# Patient Record
Sex: Female | Born: 1950 | Race: White | Hispanic: No | Smoking: Former smoker
Health system: Southern US, Community
[De-identification: ages and names within clinical notes are randomized; demographics above are authoritative.]

## PROBLEM LIST (undated history)

## (undated) DIAGNOSIS — C801 Malignant (primary) neoplasm, unspecified: Secondary | ICD-10-CM

## (undated) DIAGNOSIS — R748 Abnormal levels of other serum enzymes: Secondary | ICD-10-CM

## (undated) DIAGNOSIS — M1990 Unspecified osteoarthritis, unspecified site: Secondary | ICD-10-CM

## (undated) DIAGNOSIS — E039 Hypothyroidism, unspecified: Secondary | ICD-10-CM

## (undated) DIAGNOSIS — N3090 Cystitis, unspecified without hematuria: Secondary | ICD-10-CM

## (undated) DIAGNOSIS — F419 Anxiety disorder, unspecified: Secondary | ICD-10-CM

## (undated) DIAGNOSIS — Z87891 Personal history of nicotine dependence: Secondary | ICD-10-CM

## (undated) DIAGNOSIS — K219 Gastro-esophageal reflux disease without esophagitis: Secondary | ICD-10-CM

## (undated) DIAGNOSIS — Z8614 Personal history of Methicillin resistant Staphylococcus aureus infection: Secondary | ICD-10-CM

## (undated) DIAGNOSIS — C50912 Malignant neoplasm of unspecified site of left female breast: Secondary | ICD-10-CM

## (undated) DIAGNOSIS — F32A Depression, unspecified: Secondary | ICD-10-CM

## (undated) DIAGNOSIS — E669 Obesity, unspecified: Secondary | ICD-10-CM

## (undated) DIAGNOSIS — D649 Anemia, unspecified: Secondary | ICD-10-CM

## (undated) DIAGNOSIS — R7982 Elevated C-reactive protein (CRP): Secondary | ICD-10-CM

## (undated) DIAGNOSIS — R7303 Prediabetes: Secondary | ICD-10-CM

## (undated) DIAGNOSIS — Z8619 Personal history of other infectious and parasitic diseases: Secondary | ICD-10-CM

## (undated) DIAGNOSIS — E7800 Pure hypercholesterolemia, unspecified: Secondary | ICD-10-CM

## (undated) DIAGNOSIS — Z8542 Personal history of malignant neoplasm of other parts of uterus: Secondary | ICD-10-CM

## (undated) HISTORY — DX: Malignant (primary) neoplasm, unspecified: C80.1

## (undated) HISTORY — DX: Pure hypercholesterolemia, unspecified: E78.00

## (undated) HISTORY — PX: US LT BREAST BX W LOC DEV 1ST LESION IMG BX SPEC US GUIDE: IMG5431

## (undated) HISTORY — DX: Hypothyroidism, unspecified: E03.9

## (undated) HISTORY — DX: Personal history of other infectious and parasitic diseases: Z86.19

## (undated) HISTORY — PX: BILATERAL SALPINGOOPHORECTOMY: SHX1223

---
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
## 1976-10-23 HISTORY — PX: TUBAL LIGATION: SHX77

## 1982-10-23 HISTORY — PX: MANDIBLE FRACTURE SURGERY: SHX706

## 1994-10-23 DIAGNOSIS — Z87891 Personal history of nicotine dependence: Secondary | ICD-10-CM | POA: Insufficient documentation

## 2008-04-10 ENCOUNTER — Ambulatory Visit: Payer: Self-pay | Admitting: Family Medicine

## 2008-06-01 DIAGNOSIS — Z8614 Personal history of Methicillin resistant Staphylococcus aureus infection: Secondary | ICD-10-CM | POA: Insufficient documentation

## 2009-02-02 DIAGNOSIS — R7989 Other specified abnormal findings of blood chemistry: Secondary | ICD-10-CM | POA: Insufficient documentation

## 2009-02-02 DIAGNOSIS — E7800 Pure hypercholesterolemia, unspecified: Secondary | ICD-10-CM | POA: Insufficient documentation

## 2009-07-22 ENCOUNTER — Ambulatory Visit: Payer: Self-pay

## 2010-08-24 LAB — HM MAMMOGRAPHY

## 2010-11-15 ENCOUNTER — Ambulatory Visit: Payer: Self-pay | Admitting: Family Medicine

## 2010-11-26 ENCOUNTER — Ambulatory Visit: Payer: Self-pay | Admitting: Family Medicine

## 2011-10-24 DIAGNOSIS — E039 Hypothyroidism, unspecified: Secondary | ICD-10-CM

## 2011-10-24 HISTORY — DX: Hypothyroidism, unspecified: E03.9

## 2012-02-12 ENCOUNTER — Ambulatory Visit: Payer: Self-pay | Admitting: Obstetrics and Gynecology

## 2012-02-12 LAB — COMPREHENSIVE METABOLIC PANEL
Alkaline Phosphatase: 106 U/L (ref 50–136)
BUN: 26 mg/dL — ABNORMAL HIGH (ref 7–18)
Chloride: 105 mmol/L (ref 98–107)
Co2: 26 mmol/L (ref 21–32)
EGFR (Non-African Amer.): 56 — ABNORMAL LOW
Glucose: 101 mg/dL — ABNORMAL HIGH (ref 65–99)
Osmolality: 286 (ref 275–301)
Potassium: 4.2 mmol/L (ref 3.5–5.1)
Total Protein: 8.1 g/dL (ref 6.4–8.2)

## 2012-02-12 LAB — CBC
HCT: 44 % (ref 35.0–47.0)
MCHC: 32.1 g/dL (ref 32.0–36.0)
MCV: 89 fL (ref 80–100)
RBC: 4.97 10*6/uL (ref 3.80–5.20)
WBC: 7.7 10*3/uL (ref 3.6–11.0)

## 2012-02-23 ENCOUNTER — Ambulatory Visit: Payer: Self-pay | Admitting: Obstetrics and Gynecology

## 2012-02-27 LAB — PATHOLOGY REPORT

## 2012-03-05 DIAGNOSIS — C549 Malignant neoplasm of corpus uteri, unspecified: Secondary | ICD-10-CM | POA: Insufficient documentation

## 2012-03-23 DIAGNOSIS — C801 Malignant (primary) neoplasm, unspecified: Secondary | ICD-10-CM

## 2012-03-23 HISTORY — PX: LYMPHADENECTOMY: SHX15

## 2012-03-23 HISTORY — DX: Malignant (primary) neoplasm, unspecified: C80.1

## 2012-03-23 HISTORY — PX: BILATERAL SALPINGOOPHORECTOMY: SHX1223

## 2012-04-18 HISTORY — PX: ABDOMINAL HYSTERECTOMY: SHX81

## 2013-09-01 LAB — LIPID PANEL
Cholesterol: 225 mg/dL — AB (ref 0–200)
HDL: 46 mg/dL (ref 35–70)
LDL Cholesterol: 129 mg/dL
TRIGLYCERIDES: 252 mg/dL — AB (ref 40–160)

## 2013-09-01 LAB — TSH: TSH: 2.25 u[IU]/mL (ref <=5.90)

## 2013-09-01 LAB — HEMOGLOBIN A1C: Hgb A1c MFr Bld: 6.3 % — AB (ref 4.0–6.0)

## 2014-04-23 ENCOUNTER — Ambulatory Visit: Payer: Self-pay | Admitting: Family Medicine

## 2014-04-23 LAB — COMPREHENSIVE METABOLIC PANEL
ALT: 45 U/L (ref 12–78)
ANION GAP: 7 (ref 7–16)
AST: 34 U/L (ref 15–37)
Albumin: 4 g/dL (ref 3.4–5.0)
Alkaline Phosphatase: 98 U/L
BILIRUBIN TOTAL: 0.4 mg/dL (ref 0.2–1.0)
BUN: 20 mg/dL — AB (ref 7–18)
CALCIUM: 9.4 mg/dL (ref 8.5–10.1)
CHLORIDE: 108 mmol/L — AB (ref 98–107)
Co2: 26 mmol/L (ref 21–32)
Creatinine: 0.98 mg/dL (ref 0.60–1.30)
EGFR (Non-African Amer.): >60
Glucose: 127 mg/dL — ABNORMAL HIGH (ref 65–99)
Osmolality: 285 (ref 275–301)
Potassium: 4.2 mmol/L (ref 3.5–5.1)
Sodium: 141 mmol/L (ref 136–145)
Total Protein: 8 g/dL (ref 6.4–8.2)

## 2014-04-23 LAB — CBC WITH DIFFERENTIAL/PLATELET
BASOS PCT: 0.3 %
Basophil #: 0 10*3/uL (ref 0.0–0.1)
Eosinophil #: 0.1 10*3/uL (ref 0.0–0.7)
Eosinophil %: 1.2 %
HCT: 42.5 % (ref 35.0–47.0)
HGB: 14.1 g/dL (ref 12.0–16.0)
Lymphocyte #: 1 10*3/uL (ref 1.0–3.6)
Lymphocyte %: 10.9 %
MCH: 29.9 pg (ref 26.0–34.0)
MCHC: 33.3 g/dL (ref 32.0–36.0)
MCV: 90 fL (ref 80–100)
MONOS PCT: 4.3 %
Monocyte #: 0.4 x10 3/mm (ref 0.2–0.9)
NEUTROS ABS: 7.5 10*3/uL — AB (ref 1.4–6.5)
Neutrophil %: 83.3 %
Platelet: 269 10*3/uL (ref 150–440)
RBC: 4.74 10*6/uL (ref 3.80–5.20)
RDW: 13.9 % (ref 11.5–14.5)
WBC: 9.1 10*3/uL (ref 3.6–11.0)

## 2014-04-23 LAB — TSH: Thyroid Stimulating Horm: 2.02 u[IU]/mL

## 2014-10-01 LAB — BASIC METABOLIC PANEL
CREATININE: 1.1 mg/dL (ref 0.5–1.1)
GLUCOSE: 116 mg/dL
Potassium: 4.5 mmol/L (ref 3.4–5.3)
SODIUM: 142 mmol/L (ref 137–147)

## 2014-10-01 LAB — CBC AND DIFFERENTIAL
HCT: 281 % — AB (ref 36–46)
Hemoglobin: 14.2 g/dL (ref 12.0–16.0)
Platelets: 281 10*3/uL (ref 150–399)

## 2014-10-01 LAB — HEPATIC FUNCTION PANEL
ALT: 24 U/L (ref 7–35)
AST: 17 U/L (ref 13–35)

## 2014-12-02 ENCOUNTER — Ambulatory Visit: Payer: Self-pay | Admitting: Unknown Physician Specialty

## 2015-01-27 ENCOUNTER — Ambulatory Visit: Payer: No Typology Code available for payment source | Admitting: Diagnostic Neuroimaging

## 2015-02-14 NOTE — Op Note (Signed)
PATIENT NAME:  Melinda Mack, Melinda Mack MR#:  [id number] DATE OF BIRTH:  1951/05/30  DATE OF PROCEDURE:  02/23/2012  PREOPERATIVE DIAGNOSIS: Postmenopausal bleeding.  POSTOPERATIVE DIAGNOSIS: Postmenopausal bleeding.  PROCEDURES:  1. Dilation and curettage. 2. Hysteroscopy.   SURGEON: Prentice Docker, M.D.   ANESTHESIA: General.   ESTIMATED BLOOD LOSS: Minimal.   OPERATIVE FLUIDS: 700 mL crystalloid.   COMPLICATIONS: None.   FINDINGS: Polypoid lesions near right cornua.   SPECIMENS: Endometrial curettings.   CONDITION: Stable and unchanged at the end of the procedure.   INDICATION FOR PROCEDURE: [name] is a 64 year old postmenopausal female who presented to [facility] with complaint of postmenopausal bleeding. She underwent a work-up for this including an ultrasound which had findings suggestive for polyps. She was referred to me for management and after discussing multiple options to diagnose she elected to go to the operating room for the above-noted procedure.   PROCEDURE IN DETAIL: The patient was met in the preoperative area and her questions were answered and the procedure was reviewed. She was taken to the operating room where she was placed under general anesthesia, which was found to be adequate. She was then placed in the dorsal supine high lithotomy position and prepped and draped in the usual sterile fashion. At the beginning of the case, after a time out was called, her bladder was drained with a straight catheter. A sterile speculum was placed in the vagina and the anterior lip of the cervix was grasped with a single-tooth tenaculum. The uterus was sounded to a depth of approximately 6 cm. Her cervix was then dilated in a serial fashion using Hegar dilators to 8 mm. The hysteroscope was then gently introduced through the cervix and into the uterine cavity with the above-noted findings. The hysteroscope was removed and the endometrium was gently curetted for minimal returned  material from the endometrium. Final look with the hysteroscope revealed a nicely distending uterine cavity with removal of the aforementioned polypoid lesions. At this point, the procedure was terminated.   The patient tolerated the procedure well. Sponge, lap, and needle counts were correct x2. For VTE prophylaxis she had pneumatic compression stockings in place and operating throughout the entire procedure. She was awakened in the operating room and taken to the recovery room in stable condition.  ____________________________ Will Bonnet, MD sdj:slb D: 02/23/2012 15:37:08 ET T: 02/23/2012 15:45:15 ET JOB#: [id number]  cc: Will Bonnet, MD, <Dictator> Will Bonnet MD ELECTRONICALLY SIGNED 03/03/2012 21:57

## 2015-04-14 ENCOUNTER — Other Ambulatory Visit: Payer: Self-pay | Admitting: Family Medicine

## 2015-05-16 ENCOUNTER — Other Ambulatory Visit: Payer: Self-pay | Admitting: Family Medicine

## 2015-05-18 ENCOUNTER — Other Ambulatory Visit: Payer: Self-pay | Admitting: Family Medicine

## 2015-05-26 ENCOUNTER — Ambulatory Visit: Payer: No Typology Code available for payment source | Admitting: Diagnostic Neuroimaging

## 2015-06-15 DIAGNOSIS — R2689 Other abnormalities of gait and mobility: Secondary | ICD-10-CM | POA: Insufficient documentation

## 2015-06-15 DIAGNOSIS — E039 Hypothyroidism, unspecified: Secondary | ICD-10-CM | POA: Insufficient documentation

## 2015-06-15 DIAGNOSIS — E669 Obesity, unspecified: Secondary | ICD-10-CM | POA: Insufficient documentation

## 2015-06-15 DIAGNOSIS — R7303 Prediabetes: Secondary | ICD-10-CM | POA: Insufficient documentation

## 2015-06-15 DIAGNOSIS — N3090 Cystitis, unspecified without hematuria: Secondary | ICD-10-CM | POA: Insufficient documentation

## 2015-06-15 DIAGNOSIS — Z8542 Personal history of malignant neoplasm of other parts of uterus: Secondary | ICD-10-CM | POA: Insufficient documentation

## 2015-06-15 DIAGNOSIS — G4700 Insomnia, unspecified: Secondary | ICD-10-CM | POA: Insufficient documentation

## 2015-06-15 DIAGNOSIS — N951 Menopausal and female climacteric states: Secondary | ICD-10-CM | POA: Insufficient documentation

## 2015-06-15 DIAGNOSIS — R12 Heartburn: Secondary | ICD-10-CM | POA: Insufficient documentation

## 2015-06-15 DIAGNOSIS — L821 Other seborrheic keratosis: Secondary | ICD-10-CM | POA: Insufficient documentation

## 2015-06-16 ENCOUNTER — Ambulatory Visit (INDEPENDENT_AMBULATORY_CARE_PROVIDER_SITE_OTHER): Payer: No Typology Code available for payment source | Admitting: Family Medicine

## 2015-06-16 ENCOUNTER — Encounter: Payer: Self-pay | Admitting: Family Medicine

## 2015-06-16 VITALS — BP 108/68 | HR 67 | Temp 98.3°F | Resp 16 | Ht 65.25 in | Wt 184.0 lb

## 2015-06-16 DIAGNOSIS — Z23 Encounter for immunization: Secondary | ICD-10-CM | POA: Diagnosis not present

## 2015-06-16 DIAGNOSIS — R7303 Prediabetes: Secondary | ICD-10-CM

## 2015-06-16 DIAGNOSIS — E7800 Pure hypercholesterolemia, unspecified: Secondary | ICD-10-CM

## 2015-06-16 DIAGNOSIS — E039 Hypothyroidism, unspecified: Secondary | ICD-10-CM | POA: Diagnosis not present

## 2015-06-16 DIAGNOSIS — R5383 Other fatigue: Secondary | ICD-10-CM | POA: Diagnosis not present

## 2015-06-16 DIAGNOSIS — R7309 Other abnormal glucose: Secondary | ICD-10-CM

## 2015-06-16 NOTE — Progress Notes (Signed)
Patient: Melinda Mack Female    DOB: Nov 26, 1950   64 y.o.   MRN: [id number] Visit Date: 06/16/2015  Today's Provider: Lelon Huh, MD   Chief Complaint  Patient presents with  . Hypothyroidism    follow- up  . Hyperglycemia    follow- up  . Hyperlipidemia    follow- up   Subjective:    HPI  Prediabetes, Follow-up:   Lab Results  Component Value Date   HGBA1C 6.3* 09/01/2013   GLUCOSE 127* 04/23/2014   GLUCOSE 101* 02/12/2012    Last seen for for this2 years ago.  Management changes included  none.  Weight trend: stable Prior visit with dietician: no Current diet: well balanced Current exercise: none  Pertinent Labs:    Component Value Date/Time   CHOL 225* 09/01/2013   TRIG 252* 09/01/2013   CREATININE 1.1 10/01/2014   CREATININE 0.98 04/23/2014 1212    Wt Readings from Last 3 Encounters:  10/01/14 177 lb (80.287 kg)     Lipid/Cholesterol, Follow-up:   Last seen for this 2 years ago.  Management changes since that visit include  Increasing Lovastatin to 40mg  daily. . Last Lipid Panel:    Component Value Date/Time   CHOL 225* 09/01/2013   TRIG 252* 09/01/2013   HDL 46 09/01/2013   LDLCALC 129 09/01/2013    Risk factors for vascular disease include Hyperlipidemia  She reports good compliance with treatment. She is not having side effects.  Current symptoms include none and have been stable. Weight trend: stable Prior visit with dietician: no Current diet: in general, a "healthy" diet   Current exercise: none  Wt Readings from Last 3 Encounters:  06/16/15 184 lb (83.462 kg)  10/01/14 177 lb (80.287 kg)    -------------------------------------------------------------------   Hypothyroidism Follow up: Last office visit was 09/01/2013. Labs were ordered during that visit and were normal. No changes were made. Patient reports good compliance with treatment and good tolerance.   Allergies  Allergen Reactions  . Codeine  Hives  . Penicillins Swelling  . Sulfa Antibiotics     Other reaction(s): Skin Rashes  . Tetracycline Hives   Previous Medications   ASPIRIN 81 MG EC TABLET    1 tablet daily.   LEVOTHYROXINE (SYNTHROID, LEVOTHROID) 50 MCG TABLET    TAKE ONE TABLET BY MOUTH ONCE DAILY   LOVASTATIN (MEVACOR) 40 MG TABLET    Take 1 tablet by mouth at bedtime.   MECLIZINE (ANTIVERT) 25 MG TABLET    Take 1 tablet by mouth 3 (three) times daily as needed.    Review of Systems  Constitutional: Positive for fatigue and unexpected weight change. Negative for fever, chills and appetite change.  Respiratory: Negative for chest tightness, shortness of breath and wheezing.   Cardiovascular: Positive for leg swelling (right lower leg).    Social History  Substance Use Topics  . Smoking status: Former Smoker -- 0.50 packs/day for 9 years    Types: Cigarettes  . Smokeless tobacco: Never Used     Comment: QUIT IN 1996  . Alcohol Use: No   Objective:   BP 108/68 mmHg  Pulse 67  Temp(Src) 98.3 F (36.8 C) (Oral)  Resp 16  Ht 5' 5.25" (1.657 m)  Wt 184 lb (83.462 kg)  BMI 30.40 kg/m2  SpO2 97%  Physical Exam General Appearance:    Alert, cooperative, no distress, overweight  Eyes:    PERRL, conjunctiva/corneas clear, EOM's intact  Lungs:     Clear to auscultation bilaterally, respirations unlabored  Heart:    Regular rate and rhythm  Neurologic:   Awake, alert, oriented x 3. No apparent focal neurological           defect.         Assessment & Plan:     1. Other fatigue  - CBC - Comprehensive metabolic panel  2. Hypothyroidism, unspecified hypothyroidism type  - T4 AND TSH  3. Pre-diabetes  - Hemoglobin A1c  4. Hypercholesterolemia without hypertriglyceridemia  - Lipid panel  5. Need for influenza vaccination  - Flu Vaccine QUAD 36+ mos PF IM (Fluarix & Fluzone Quad PF)       Lelon Huh, MD  [facility]

## 2015-06-17 LAB — COMPREHENSIVE METABOLIC PANEL
ALT: 25 IU/L (ref 0–32)
AST: 19 IU/L (ref 0–40)
Albumin/Globulin Ratio: 2 (ref 1.1–2.5)
Albumin: 4.5 g/dL (ref 3.6–4.8)
Alkaline Phosphatase: 87 IU/L (ref 39–117)
BUN/Creatinine Ratio: 20 (ref 11–26)
BUN: 19 mg/dL (ref 8–27)
Bilirubin Total: 0.4 mg/dL (ref 0.0–1.2)
CALCIUM: 10.2 mg/dL (ref 8.7–10.3)
CHLORIDE: 105 mmol/L (ref 97–108)
CO2: 24 mmol/L (ref 18–29)
Creatinine, Ser: 0.96 mg/dL (ref 0.57–1.00)
GFR, EST AFRICAN AMERICAN: 72 mL/min/{1.73_m2} (ref >59)
GFR, EST NON AFRICAN AMERICAN: 63 mL/min/{1.73_m2} (ref >59)
GLUCOSE: 127 mg/dL — AB (ref 65–99)
Globulin, Total: 2.3 g/dL (ref 1.5–4.5)
Potassium: 5.1 mmol/L (ref 3.5–5.2)
Sodium: 145 mmol/L — ABNORMAL HIGH (ref 134–144)
TOTAL PROTEIN: 6.8 g/dL (ref 6.0–8.5)

## 2015-06-17 LAB — LIPID PANEL
CHOL/HDL RATIO: 4.8 ratio — AB (ref 0.0–4.4)
Cholesterol, Total: 197 mg/dL (ref 100–199)
HDL: 41 mg/dL (ref >39)
LDL Calculated: 88 mg/dL (ref 0–99)
TRIGLYCERIDES: 338 mg/dL — AB (ref 0–149)
VLDL CHOLESTEROL CAL: 68 mg/dL — AB (ref 5–40)

## 2015-06-17 LAB — CBC
HEMATOCRIT: 41.6 % (ref 34.0–46.6)
Hemoglobin: 13.8 g/dL (ref 11.1–15.9)
MCH: 29.6 pg (ref 26.6–33.0)
MCHC: 33.2 g/dL (ref 31.5–35.7)
MCV: 89 fL (ref 79–97)
PLATELETS: 289 10*3/uL (ref 150–379)
RBC: 4.67 x10E6/uL (ref 3.77–5.28)
RDW: 14 % (ref 12.3–15.4)
WBC: 7 10*3/uL (ref 3.4–10.8)

## 2015-06-17 LAB — HEMOGLOBIN A1C
Est. average glucose Bld gHb Est-mCnc: 128 mg/dL
Hgb A1c MFr Bld: 6.1 % — ABNORMAL HIGH (ref 4.8–5.6)

## 2015-06-17 LAB — T4 AND TSH
T4 TOTAL: 9.8 ug/dL (ref 4.5–12.0)
TSH: 2.69 u[IU]/mL (ref 0.450–4.500)

## 2015-06-21 ENCOUNTER — Other Ambulatory Visit: Payer: Self-pay | Admitting: Family Medicine

## 2015-07-05 ENCOUNTER — Encounter: Payer: Self-pay | Admitting: *Deleted

## 2015-07-05 ENCOUNTER — Ambulatory Visit (INDEPENDENT_AMBULATORY_CARE_PROVIDER_SITE_OTHER): Payer: No Typology Code available for payment source | Admitting: Diagnostic Neuroimaging

## 2015-07-05 VITALS — BP 137/81 | HR 65 | Ht 65.0 in | Wt 181.0 lb

## 2015-07-05 DIAGNOSIS — R221 Localized swelling, mass and lump, neck: Secondary | ICD-10-CM | POA: Diagnosis not present

## 2015-07-05 NOTE — Progress Notes (Signed)
[facility]  PATIENT: Melinda Mack DOB: 02-23-1951  REFERRING CLINICIAN: Gemma Payor HISTORY FROM: patient  REASON FOR VISIT: new consult    HISTORICAL  CHIEF COMPLAINT:  Chief Complaint  Patient presents with  . Cervical Arthralgia    rm 6, New Patient    HISTORY OF PRESENT ILLNESS:   64 year old right-handed female here for evaluation of cervical arthralgia. Patient referred by ENT for evaluation.  Patient had been having some intermittent dizziness, lightheadedness sensation, went to ENT for evaluation. Patient was also complaining of numb tingling sensation in left scalp and left neck. Also left neck fullness posteriorly. As a result of these symptoms patient had MRI of the brain and neck ordered. The brain showed no acute findings. MRI of the neck showed multilevel facet hypertrophy on the left side and therefore patient was referred to me for further evaluation.  Patient denies any neck pain. No radiating pain or tingling into the arms, spine or legs. She has full range of motion in her neck without difficulty. No weakness in her fingers or hands. No bowel or bladder dysfunction.  Dizziness symptoms have significant subsided.   REVIEW OF SYSTEMS: Full 14 system review of systems performed and notable only for dizziness frequent waking.  ALLERGIES: Allergies  Allergen Reactions  . Codeine Hives  . Penicillins Swelling  . Sulfa Antibiotics     Other reaction(s): Skin Rashes  . Tetracycline Hives    HOME MEDICATIONS: Outpatient Prescriptions Prior to Visit  Medication Sig Dispense Refill  . Aspirin 81 MG EC tablet 1 tablet daily.    Marland Kitchen levothyroxine (SYNTHROID, LEVOTHROID) 50 MCG tablet TAKE ONE TABLET BY MOUTH ONCE DAILY 30 tablet 6  . lovastatin (MEVACOR) 40 MG tablet Take one tablet every evening. 30 tablet 5  . meclizine (ANTIVERT) 25 MG tablet Take 1 tablet by mouth 3 (three) times daily as needed.     No facility-administered  medications prior to visit.    PAST MEDICAL HISTORY: Past Medical History  Diagnosis Date  . History of chicken pox   . History of measles   . History of mumps   . Cancer 03/2012    uterine  . Hypercholesterolemia   . Hypothyroidism 2013    PAST SURGICAL HISTORY: Past Surgical History  Procedure Laterality Date  . Tubal ligation  1978  . Abdominal hysterectomy  04/18/2012    cancer  . Bilateral salpingoophorectomy  6/20123  . Lymphadenectomy  03/2012  . Tonsillectomy and adenoidectomy  1970  . Mandible fracture surgery  1984    TMJ    FAMILY HISTORY: Family History  Problem Relation Age of Onset  . Lung disease Mother   . Lung disease Father   . Healthy Sister   . Healthy Sister   . Coronary artery disease Other   . Hypertension Maternal Uncle   . Heart attack Maternal Grandmother   . Cancer Maternal Grandmother   . Cancer Maternal Grandfather   . Alzheimer's disease Paternal Grandmother   . Cancer Paternal Grandmother   . Cancer Paternal Grandfather     SOCIAL HISTORY:  Social History   Social History  . Marital Status: Widowed    Spouse Name: N/A  . Number of Children: 3  . Years of Education: 12   Occupational History  .      [employer]   Social History Main Topics  . Smoking status: Former Smoker -- 0.50 packs/day for 9 years    Types: Cigarettes  Quit date: 04/26/1992  . Smokeless tobacco: Never Used     Comment: QUIT IN 1996  . Alcohol Use: No  . Drug Use: No  . Sexual Activity: Not on file   Other Topics Concern  . Not on file   Social History Narrative   Widowed   Caffeine use- coffee - 2 cups daily     PHYSICAL EXAM  GENERAL EXAM/CONSTITUTIONAL: Vitals:  Filed Vitals:   07/05/15 0851  BP: 137/81  Pulse: 65  Height: 5\' 5"  (1.651 m)  Weight: 181 lb (82.101 kg)     Body mass index is 30.12 kg/(m^2).  Visual Acuity Screening   Right eye Left eye Both eyes  Without correction:     With correction: 20/30 20/20       Patient is in no distress; well developed, nourished and groomed; neck is supple  NECK IS NON-TENDER TO PALPATION  CARDIOVASCULAR:  Examination of carotid arteries is normal; no carotid bruits  Regular rate and rhythm, no murmurs  Examination of peripheral vascular system by observation and palpation is normal  EYES:  Ophthalmoscopic exam of optic discs and posterior segments is normal; no papilledema or hemorrhages  MUSCULOSKELETAL:  Gait, strength, tone, movements noted in Neurologic exam below  NEUROLOGIC: MENTAL STATUS:  No flowsheet data found.  awake, alert, oriented to person, place and time  recent and remote memory intact  normal attention and concentration  language fluent, comprehension intact, naming intact,   fund of knowledge appropriate  CRANIAL NERVE:   2nd - no papilledema on fundoscopic exam  2nd, 3rd, 4th, 6th - pupils equal and reactive to light, visual fields full to confrontation, extraocular muscles intact, no nystagmus  5th - facial sensation symmetric  7th - facial strength symmetric  8th - hearing intact  9th - palate elevates symmetrically, uvula midline  11th - shoulder shrug symmetric  12th - tongue protrusion midline  MOTOR:   normal bulk and tone, full strength in the BUE, BLE  SENSORY:   normal and symmetric to light touch, temperature, vibration  COORDINATION:   finger-nose-finger, fine finger movements normal  REFLEXES:   deep tendon reflexes present and symmetric  GAIT/STATION:   narrow based gait; romberg is negative    DIAGNOSTIC DATA (LABS, IMAGING, TESTING) - I reviewed patient records, labs, notes, testing and imaging myself where available.  Lab Results  Component Value Date   WBC 7.0 06/16/2015   HGB 14.2 10/01/2014   HCT 41.6 06/16/2015   MCV 90 04/23/2014   PLT 281 10/01/2014      Component Value Date/Time   NA 145* 06/16/2015 0931   NA 141 04/23/2014 1212   K 5.1 06/16/2015  0931   K 4.2 04/23/2014 1212   CL 105 06/16/2015 0931   CL 108* 04/23/2014 1212   CO2 24 06/16/2015 0931   CO2 26 04/23/2014 1212   GLUCOSE 127* 06/16/2015 0931   GLUCOSE 127* 04/23/2014 1212   BUN 19 06/16/2015 0931   BUN 20* 04/23/2014 1212   CREATININE 0.96 06/16/2015 0931   CREATININE 1.1 10/01/2014   CREATININE 0.98 04/23/2014 1212   CALCIUM 10.2 06/16/2015 0931   CALCIUM 9.4 04/23/2014 1212   PROT 6.8 06/16/2015 0931   PROT 8.0 04/23/2014 1212   ALBUMIN 4.0 04/23/2014 1212   AST 19 06/16/2015 0931   AST 34 04/23/2014 1212   ALT 25 06/16/2015 0931   ALT 45 04/23/2014 1212   ALKPHOS 87 06/16/2015 0931   ALKPHOS 98 04/23/2014 1212  BILITOT 0.4 06/16/2015 0931   BILITOT 0.4 04/23/2014 1212   GFRNONAA 63 06/16/2015 0931   GFRNONAA >60 04/23/2014 1212   GFRAA 72 06/16/2015 0931   GFRAA >60 04/23/2014 1212   Lab Results  Component Value Date   CHOL 197 06/16/2015   HDL 41 06/16/2015   LDLCALC 88 06/16/2015   TRIG 338* 06/16/2015   CHOLHDL 4.8* 06/16/2015   Lab Results  Component Value Date   HGBA1C 6.1* 06/16/2015   No results found for: VITAMINB12 Lab Results  Component Value Date   TSH 2.690 06/16/2015    12/02/14 MRI BRAIN [I reviewed images myself and agree with interpretation. -VRP]  - No acute or focal intracranial abnormality.  12/02/14 MRI CERVICAL [I reviewed images myself and agree with interpretation. Also moderate right foraminal stenosis at C5-6. -VRP]  Impression: No acute or focal intracranial abnormality.No disc protrusion or spinal stenosis.Asymmetric facet arthropathy on the LEFT C3 through C6. The most severe changes are seen above and below C4-5. Correlate clinically for facet mediated cervicalgia.     ASSESSMENT AND PLAN  64 y.o. year old female here with intermittent dizziness, left scalp numbness, left posterior neck fullness and tingling. MRI findings reviewed. Patient has mild degenerative changes in the cervical spine, with minimal  symptoms correlated to these findings. Advised conservative management. Currently patient has no significant pain or discomfort. Advised importance of healthy nutrition, physical activity, stretching exercise, stress management to minimize progression of degenerative changes in the cervical spine.   Dx: Neck swelling    PLAN: - no further neuro-diagnostic testing or treatment advised; general healthy habits reviewed  Return if symptoms worsen or fail to improve, for return to PCP.    Penni Bombard, MD 11/22/4386, 8:75 AM Certified in Neurology, Neurophysiology and Neuroimaging  [facility] [address] [place], [place] [address] [phone]

## 2015-07-05 NOTE — Patient Instructions (Signed)
Focus on mental, social/emotional and physical stimulation. Try learning new activities (arts, music, language, games).  Move your body often!  Eat more plants, vegetables, nuts, seeds, berries (blueberry, blackberry, raspberry).  Eat less processed, fried, salty and sugary foods.   Try to get at least 7-8+ hours sleep per day.

## 2015-09-29 ENCOUNTER — Encounter: Payer: Self-pay | Admitting: Family Medicine

## 2015-10-14 ENCOUNTER — Telehealth: Payer: Self-pay | Admitting: *Deleted

## 2015-10-14 NOTE — Telephone Encounter (Signed)
LMOVM for pt to return call 

## 2015-10-14 NOTE — Telephone Encounter (Signed)
-----   Message from Birdie Sons, MD sent at 10/12/2015  7:55 AM EST ----- Regarding: FW: make sure she had diagnostic mammogram and u/s at [facility] please check with patient to see if she ever had her follow up mammogram and ultrasound done. she was supposed to have additional views due to abnormality in right breast on her mammogram from 11/3 ----- Message -----    From: Birdie Sons, MD    Sent: 10/04/2015      To: Birdie Sons, MD Subject: make sure she had diagnostic mammogram and u#  Pt rescheduled to have test done early December (2016)

## 2015-10-14 NOTE — Telephone Encounter (Signed)
Patient returned call. Patient has appt 10/26/15 to have follow up mammogram and ultrasound done.

## 2015-10-24 HISTORY — PX: BREAST BIOPSY: SHX20

## 2015-10-26 ENCOUNTER — Telehealth: Payer: Self-pay | Admitting: Family Medicine

## 2015-10-26 DIAGNOSIS — N6310 Unspecified lump in the right breast, unspecified quadrant: Secondary | ICD-10-CM | POA: Insufficient documentation

## 2015-10-26 DIAGNOSIS — N6320 Unspecified lump in the left breast, unspecified quadrant: Secondary | ICD-10-CM | POA: Insufficient documentation

## 2015-10-26 NOTE — Telephone Encounter (Signed)
Spoke with [employer] and they report that patient's mammogram was a category 4. They have faxed over hard copy for your review and you should receive it shortly. Patient has not been notified yet.

## 2015-10-26 NOTE — Telephone Encounter (Signed)
Returned call to [employer]. Notified that result has been faxed to office showing BI-RADS:category 4. Results received and given to provider.

## 2015-10-26 NOTE — Telephone Encounter (Signed)
Lauro Franklin with [facility] called with a call report on Mrs. Spadoni. Additional views mammagram and [place].  Please call them back.  At [phone]  Thanks Con Memos

## 2015-10-26 NOTE — Telephone Encounter (Signed)
Please advise patient that mammogram shows small mass in right lateral breast that needs further evaluation, probably a biopsy. Needs referral to surgery to evaluate. Order has been sent to [facility] for referral. Thanks.

## 2015-10-26 NOTE — Telephone Encounter (Signed)
Left message to call back  

## 2015-10-27 ENCOUNTER — Encounter: Payer: Self-pay | Admitting: *Deleted

## 2015-10-27 NOTE — Telephone Encounter (Signed)
Patient was notified. Expressed understanding.  

## 2015-10-27 NOTE — Telephone Encounter (Signed)
Sarah please schedule. Thanks!

## 2015-10-27 NOTE — Telephone Encounter (Signed)
Pt called back to get results. Thanks TNP

## 2015-10-27 NOTE — Telephone Encounter (Signed)
Pt returned call.  She will call back later.  Thanks Con Memos

## 2015-11-01 ENCOUNTER — Ambulatory Visit: Payer: No Typology Code available for payment source | Admitting: General Surgery

## 2015-11-09 ENCOUNTER — Encounter: Payer: Self-pay | Admitting: General Surgery

## 2015-11-09 ENCOUNTER — Ambulatory Visit (INDEPENDENT_AMBULATORY_CARE_PROVIDER_SITE_OTHER): Payer: 59 | Admitting: General Surgery

## 2015-11-09 ENCOUNTER — Other Ambulatory Visit: Payer: 59

## 2015-11-09 VITALS — BP 138/82 | HR 70 | Resp 12 | Ht 65.0 in | Wt 175.0 lb

## 2015-11-09 DIAGNOSIS — R928 Other abnormal and inconclusive findings on diagnostic imaging of breast: Secondary | ICD-10-CM | POA: Diagnosis not present

## 2015-11-09 DIAGNOSIS — N6310 Unspecified lump in the right breast, unspecified quadrant: Secondary | ICD-10-CM

## 2015-11-09 NOTE — Progress Notes (Signed)
Patient ID: Melinda Mack, female   DOB: 17-Jun-1951, 65 y.o.   MRN: [id number]  Chief Complaint  Patient presents with  . Other    right breast mass    HPI Melinda Mack is a 65 y.o. female who presents for a breast evaluation. The most recent mammogram and ultrasound was done on 10/26/15.   Patient does perform regular self breast checks and gets regular mammograms done.  She states she can not feel anything different in the breast. Denies breast injury or trauma. She has had to go back for repeat ultrasounds in the past but no biopsies have ever been taken.  She has not had a colonoscopy. Her husband developed a glioblastoma and was under treatment for 22 months. This has postponed some of her own health maintenance.  I personally reviewed the patient's history.  HPI  Past Medical History  Diagnosis Date  . History of chicken pox   . History of measles   . History of mumps   . Cancer ([place]) 03/2012    uterine  . Hypercholesterolemia   . Hypothyroidism 2013    Past Surgical History  Procedure Laterality Date  . Tubal ligation  1978  . Abdominal hysterectomy  04/18/2012    cancer at [facility]  . Bilateral salpingoophorectomy  6/20123  . Lymphadenectomy  03/2012  . Tonsillectomy and adenoidectomy  1970  . Mandible fracture surgery  1984    TMJ    Family History  Problem Relation Age of Onset  . Lung disease Mother   . Lung disease Father   . Healthy Sister   . Healthy Sister   . Coronary artery disease Other   . Hypertension Maternal Uncle   . Heart attack Maternal Grandmother   . Breast cancer Paternal Aunt 79  . Lung cancer Maternal Uncle 71  . Alzheimer's disease Paternal Grandmother     Social History Social History  Substance Use Topics  . Smoking status: Former Smoker -- 0.50 packs/day for 9 years    Types: Cigarettes    Quit date: 04/26/1992  . Smokeless tobacco: Never Used     Comment: QUIT IN 1996  . Alcohol Use: No    Allergies  Allergen  Reactions  . Codeine Hives  . Penicillins Swelling  . Sulfa Antibiotics     Other reaction(s): Skin Rashes  . Tetracycline Hives    Current Outpatient Prescriptions  Medication Sig Dispense Refill  . Aspirin 81 MG EC tablet 1 tablet daily.    . fluticasone (FLONASE) 50 MCG/ACT nasal spray as needed.     Marland Kitchen levothyroxine (SYNTHROID, LEVOTHROID) 50 MCG tablet TAKE ONE TABLET BY MOUTH ONCE DAILY 30 tablet 6  . lovastatin (MEVACOR) 40 MG tablet Take one tablet every evening. 30 tablet 5  . Multiple Vitamin (MULTIVITAMIN) capsule Take 1 capsule by mouth daily.     No current facility-administered medications for this visit.    Review of Systems Review of Systems  Constitutional: Negative.   Respiratory: Negative.   Cardiovascular: Negative.     Blood pressure 138/82, pulse 70, resp. rate 12, height 5\' 5"  (1.651 m), weight 175 lb (79.379 kg).  Physical Exam Physical Exam  Constitutional: She is oriented to person, place, and time. She appears well-developed and well-nourished.  HENT:  Mouth/Throat: Oropharynx is clear and moist.  Eyes: Conjunctivae are normal. No scleral icterus.  Neck: Neck supple.  Cardiovascular: Normal rate, regular rhythm and normal heart sounds.   Pulmonary/Chest: Effort normal and breath sounds normal.  Right breast exhibits no inverted nipple, no mass, no nipple discharge, no skin change and no tenderness. Left breast exhibits no inverted nipple, no mass, no nipple discharge, no skin change and no tenderness.    Lymphadenopathy:    She has no cervical adenopathy.    She has no axillary adenopathy.  Neurological: She is alert and oriented to person, place, and time.  Skin: Skin is warm and dry.  Psychiatric: Her behavior is normal.    Data Reviewed Lateral screening mammograms dated 08/26/2015 showed a 1.3 cm area of asymmetric density in the upper-outer quadrant of the right breast which appeared more prominent than past studies. Previous exams that  showed multiple cysts in the area. Numerous, stable round asymmetries are identified in the left breast. BI-RADS-0.  Additional views of both breasts and ultrasound bilaterally completed 10/26/2015 in the right breast posteriorly at the 9:30 o'clock position showed a 1.57 m complex cystic and solid mass with negative exam of the axilla. The left breast showed multiple prominent retroareolar ducts without intraluminal debris as well as a few tiny cysts and small indeterminate oval subcentimeter hypoechoic mass at 6:00 position. BI-RADS-4.  Ultrasound examination of the right breast in the 9:30 o'clock position, 8 cm from nipple showed a 0.9 x 1.2 x 1.3 cm multilobulated lesion with several hypoechoic as well as hyperechoic areas with some posterior acoustic enhancement.Marland Kitchen BI-RADS-4.  The patient was amenable to vacuum biopsy. 10 mL of 0.5% Xylocaine with 0.25% Marcaine with 1-200,000 of epinephrine was utilized well tolerated. A 10-gauge Encor device was placed into the central portion of the lesion and a total of 9 core samples used to completely remove the area. A postbiopsy clip was placed. There was a small hematoma that developed post clip placement and this was expressed under ultrasound guidance and direct pressure held for several minutes without recurrence. The skin defect was closed with benzoin and Steri-Strips followed by Telfa and Tegaderm dressing. As the patient had not brought a bra to today's appointment she was wrapped with a Kerlix wrap followed by a six-inch Ace wrap to maintain adequate compression.  Assessment    Likely multiple cystic lesions/fibrocystic changes of the right breast.  Indeterminate nodule in the left breast warranting ongoing observation.    Plan    The patient was provided written instructions regarding wound care. She will be contacted when the pathology report is available. We will likely plan for a follow-up office exam in 6 months with ultrasound of the left  breast at that time.      PCP:  Lelon Huh This information has been scribed by Karie Fetch [place]. Marland Kitchen   Robert Bellow 11/10/2015, 11:14 AM

## 2015-11-09 NOTE — Patient Instructions (Signed)

## 2015-11-10 DIAGNOSIS — R928 Other abnormal and inconclusive findings on diagnostic imaging of breast: Secondary | ICD-10-CM | POA: Insufficient documentation

## 2015-11-11 ENCOUNTER — Ambulatory Visit (INDEPENDENT_AMBULATORY_CARE_PROVIDER_SITE_OTHER): Payer: 59 | Admitting: *Deleted

## 2015-11-11 ENCOUNTER — Telehealth: Payer: Self-pay | Admitting: *Deleted

## 2015-11-11 DIAGNOSIS — R928 Other abnormal and inconclusive findings on diagnostic imaging of breast: Secondary | ICD-10-CM

## 2015-11-11 NOTE — Patient Instructions (Signed)
The patient is aware to call back for any questions or concerns.  

## 2015-11-11 NOTE — Telephone Encounter (Signed)
Please notify the patient per Dr Bary Castilla that her pathology was as expected, no cancer. Follow up in 6 months office visit only.Thanks.

## 2015-11-11 NOTE — Telephone Encounter (Signed)
Notified patient as instructed, patient pleased. Discussed follow-up appointments, patient agrees  

## 2015-11-11 NOTE — Progress Notes (Signed)
Patient came in today for a wound check/post right breast biopsy. She called this morning with concerns that the biopsy site had blisters.  The puncture site is clean, with no signs of infection noted, only minimal bruising noted as well as blisters bordering the tegaderm. Tegaderm removed. Dry gauze applied, aware she may use heating pad and cortisone cream (for itching) for comfort. Follow up as scheduled.

## 2015-11-16 ENCOUNTER — Ambulatory Visit: Payer: 59

## 2015-11-16 ENCOUNTER — Telehealth: Payer: Self-pay | Admitting: *Deleted

## 2015-11-16 NOTE — Telephone Encounter (Signed)
I talked with the patient and she is doing well post breast biopsy. She had developed blisters from the Tegaderm and she states the blisters finally ruptured, so she will be using neosporin as needed to the area. She states things are going well, no concerns. Follow up in July or sooner if needed. Appreciates phone call.

## 2015-11-16 NOTE — Telephone Encounter (Signed)
-----   Message from Carson Myrtle, RN sent at 11/11/2015 12:01 PM EST ----- Check on pt post biopsy

## 2015-12-23 ENCOUNTER — Other Ambulatory Visit: Payer: Self-pay | Admitting: Family Medicine

## 2015-12-31 ENCOUNTER — Other Ambulatory Visit: Payer: Self-pay | Admitting: Family Medicine

## 2016-01-10 ENCOUNTER — Other Ambulatory Visit: Payer: Self-pay | Admitting: Family Medicine

## 2016-01-11 ENCOUNTER — Other Ambulatory Visit: Payer: Self-pay | Admitting: Family Medicine

## 2016-01-13 ENCOUNTER — Other Ambulatory Visit: Payer: Self-pay | Admitting: Family Medicine

## 2016-02-01 ENCOUNTER — Encounter: Payer: Self-pay | Admitting: *Deleted

## 2016-05-02 ENCOUNTER — Encounter: Payer: Self-pay | Admitting: *Deleted

## 2016-05-08 ENCOUNTER — Encounter: Payer: Self-pay | Admitting: General Surgery

## 2016-05-08 ENCOUNTER — Other Ambulatory Visit: Payer: Self-pay

## 2016-05-08 ENCOUNTER — Ambulatory Visit (INDEPENDENT_AMBULATORY_CARE_PROVIDER_SITE_OTHER): Payer: PPO | Admitting: General Surgery

## 2016-05-08 VITALS — BP 130/74 | HR 78 | Resp 12 | Ht 65.0 in | Wt 176.0 lb

## 2016-05-08 DIAGNOSIS — N6320 Unspecified lump in the left breast, unspecified quadrant: Secondary | ICD-10-CM

## 2016-05-08 DIAGNOSIS — N6012 Diffuse cystic mastopathy of left breast: Secondary | ICD-10-CM | POA: Diagnosis not present

## 2016-05-08 DIAGNOSIS — R928 Other abnormal and inconclusive findings on diagnostic imaging of breast: Secondary | ICD-10-CM

## 2016-05-08 DIAGNOSIS — N63 Unspecified lump in breast: Secondary | ICD-10-CM

## 2016-05-08 HISTORY — PX: BREAST BIOPSY: SHX20

## 2016-05-08 NOTE — Progress Notes (Signed)
Patient ID: Melinda Mack, female   DOB: 08/10/1951, 65 y.o.   MRN: [id number]  Chief Complaint  Patient presents with  . Follow-up    Right Breast biopsy     HPI Melinda Mack is a 65 y.o. female here today for a 6 month right breast biopsy done on Jan 2017. No new breast problems at this time.   Follow-up was planned to reassess the area of her original vacuum biopsy as well as the previously noted complex lesion in the inferior aspect of the left breast.  I personally reviewed the patient's history. HPI  Past Medical History  Diagnosis Date  . History of chicken pox   . History of measles   . History of mumps   . Cancer ([place]) 03/2012    uterine  . Hypercholesterolemia   . Hypothyroidism 2013    Past Surgical History  Procedure Laterality Date  . Tubal ligation  1978  . Abdominal hysterectomy  04/18/2012    cancer at [facility]  . Bilateral salpingoophorectomy  6/20123  . Lymphadenectomy  03/2012  . Tonsillectomy and adenoidectomy  1970  . Mandible fracture surgery  1984    TMJ    Family History  Problem Relation Age of Onset  . Lung disease Mother   . Lung disease Father   . Healthy Sister   . Healthy Sister   . Coronary artery disease Other   . Hypertension Maternal Uncle   . Heart attack Maternal Grandmother   . Breast cancer Paternal Aunt 13  . Lung cancer Maternal Uncle 71  . Alzheimer's disease Paternal Grandmother     Social History Social History  Substance Use Topics  . Smoking status: Former Smoker -- 0.50 packs/day for 9 years    Types: Cigarettes    Quit date: 04/26/1992  . Smokeless tobacco: Never Used     Comment: QUIT IN 1996  . Alcohol Use: No    Allergies  Allergen Reactions  . Codeine Hives  . Penicillins Swelling  . Sulfa Antibiotics     Other reaction(s): Skin Rashes  . Tetracycline Hives    Current Outpatient Prescriptions  Medication Sig Dispense Refill  . Aspirin 81 MG EC tablet 1 tablet daily.    . fluticasone  (FLONASE) 50 MCG/ACT nasal spray as needed.     Marland Kitchen levothyroxine (SYNTHROID, LEVOTHROID) 50 MCG tablet TAKE ONE TABLET BY MOUTH ONCE DAILY 30 tablet 5  . lovastatin (MEVACOR) 40 MG tablet TAKE ONE TABLET BY MOUTH IN THE EVENING 30 tablet 4  . Multiple Vitamin (MULTIVITAMIN) capsule Take 1 capsule by mouth daily.     No current facility-administered medications for this visit.    Review of Systems Review of Systems  Constitutional: Negative.   Respiratory: Negative.   Cardiovascular: Negative.     Blood pressure 130/74, pulse 78, resp. rate 12, height 5\' 5"  (1.651 m), weight 176 lb (79.833 kg).  Physical Exam Physical Exam  Constitutional: She is oriented to person, place, and time. She appears well-developed and well-nourished.  Eyes: Conjunctivae are normal. No scleral icterus.  Neck: Neck supple.  Cardiovascular: Normal rate, regular rhythm and normal heart sounds.   Pulmonary/Chest: Effort normal and breath sounds normal. Right breast exhibits no inverted nipple, no mass, no nipple discharge, no skin change and no tenderness. Left breast exhibits no inverted nipple, no mass, no nipple discharge, no skin change and no tenderness.  Skin changes from the Tegaderm right breast.  Abdominal: Soft. Bowel sounds are normal. There  is no tenderness.  Lymphadenopathy:    She has no cervical adenopathy.  Neurological: She is alert and oriented to person, place, and time.  Skin: Skin is warm and dry.    Data Reviewed 11/09/2015 right breast biopsy:  Breast, right, needle core biopsy, 9:30 o'clock - FIBROCYSTIC CHANGES WITH USUAL DUCTAL HYPERPLASIA. - PSEUDOANGIOMATOUS STROMAL HYPERPLASIA (PASH) - THERE IS NO EVIDENCE OF MALIGNANCY. Melinda Mack. MD  Ultrasound examination of the right breast in the 9:30 o'clock position, 8 cm from the nipple focusing on the area of the previous biopsy showed no discernible area of architectural distortion. No cystic or solid lesions. BI-RADS  1.  Ultrasound examination of the 6:00 position of the left breast, 3 cm from the nipple were her December 2016 [facility] ultrasound had showed a 5 mm complex lesion identified a 0.5 x 0.55 x 0.69 multi loculated complex hypoechoic mass with focal posterior acoustic shadowing. BI-RADS 4.  The patient was amenable to core biopsy due to the enlargement of the left breast lesion. A total of 10 mL of 0.5% Xylocaine with 0.25% Marcaine was utilized well tolerated. A 10-gauge Encor biopsy device up to an 11 mm sample size was passed through the lesion was significant decrease in volume. 8 core samples were obtained with complete removal. A postbiopsy clip was placed. The procedure was well tolerated. Skin defect closed with benzoin and Steri-Strip. Telfa and Tegaderm was not used due to blistering at the site of the right breast biopsy in January. No bleeding was noted.  Assessment    Complex cyst left breast, status post biopsy.  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    Plan    Patient to return in 6 months with a bilateral diagnostic mammogram.     PCP: Dr. Caryn Section   This information has been scribed by Gaspar Cola CMA.   Melinda Mack 05/09/2016, 11:17 AM

## 2016-05-08 NOTE — Patient Instructions (Signed)

## 2016-05-09 ENCOUNTER — Telehealth: Payer: Self-pay | Admitting: General Surgery

## 2016-05-09 NOTE — Telephone Encounter (Signed)
Message left that pathology was benign.  Will plan on a f/u exam in six months w/ bilateral mammograms.

## 2016-05-19 ENCOUNTER — Encounter: Payer: Self-pay | Admitting: Family Medicine

## 2016-05-19 ENCOUNTER — Ambulatory Visit (INDEPENDENT_AMBULATORY_CARE_PROVIDER_SITE_OTHER): Payer: PPO | Admitting: Family Medicine

## 2016-05-19 VITALS — BP 124/82 | HR 86 | Temp 97.7°F | Resp 16 | Wt 177.6 lb

## 2016-05-19 DIAGNOSIS — J0100 Acute maxillary sinusitis, unspecified: Secondary | ICD-10-CM | POA: Diagnosis not present

## 2016-05-19 MED ORDER — CLINDAMYCIN HCL 300 MG PO CAPS: 300.0000 mg | ORAL_CAPSULE | Freq: Four times a day (QID) | ORAL | 0 refills | Status: DC

## 2016-05-19 NOTE — Progress Notes (Signed)
Subjective:     Patient ID: Melinda Mack, female   DOB: 15-Dec-1950, 65 y.o.   MRN: [id number]  HPI  Chief Complaint  Patient presents with  . Sinus Problem    Patient comes in office today with concerns of sinus pain and pressure for the past wek and half. Associated with sinus pain patient complains of sneezing, runny nose, cough and sore throat. Patient reports that she has been taking otc Flonase and Claritin for relief.  Patient reports increased sinus pressure, purulent  post nasal drainage and accompanying cough.States she has tolerated clindamycin in the past for a sinus infection.   Review of Systems     Objective:   Physical Exam  Constitutional: She appears well-developed and well-nourished. No distress.  Ears: T.M's intact without inflammation Sinuses: mild maxillary sinus tenderness Throat: no tonsillar enlargement or exudate, moderate posterior pharyngeal erythema Neck: no cervical adenopathy Lungs: clear     Assessment:    1. Acute maxillary sinusitis, recurrence not specified - clindamycin (CLEOCIN) 300 MG capsule; Take 1 capsule (300 mg total) by mouth 4 (four) times daily.  Dispense: 28 capsule; Refill: 0    Plan:    Discussed use of otc medication.

## 2016-05-19 NOTE — Patient Instructions (Addendum)
Discussed use of Mucinex and Benadryl at night for post nasal drainage. May use Delsym for cough.

## 2016-06-29 ENCOUNTER — Other Ambulatory Visit: Payer: Self-pay | Admitting: Family Medicine

## 2016-07-27 ENCOUNTER — Ambulatory Visit: Payer: PPO | Admitting: Family Medicine

## 2016-08-01 ENCOUNTER — Encounter: Payer: Self-pay | Admitting: Family Medicine

## 2016-08-01 ENCOUNTER — Ambulatory Visit (INDEPENDENT_AMBULATORY_CARE_PROVIDER_SITE_OTHER): Payer: PPO | Admitting: Family Medicine

## 2016-08-01 VITALS — BP 124/68 | HR 68 | Temp 98.1°F | Resp 16 | Wt 175.0 lb

## 2016-08-01 DIAGNOSIS — R609 Edema, unspecified: Secondary | ICD-10-CM

## 2016-08-01 DIAGNOSIS — M79661 Pain in right lower leg: Secondary | ICD-10-CM | POA: Diagnosis not present

## 2016-08-01 DIAGNOSIS — Z23 Encounter for immunization: Secondary | ICD-10-CM

## 2016-08-01 NOTE — Progress Notes (Signed)
       Patient: Melinda Mack Female    DOB: 10/05/1951   65 y.o.   MRN: [id number] Visit Date: 08/01/2016  Today's Provider: Lelon Huh, MD   Chief Complaint  Patient presents with  . Edema   Subjective:    Patient has had swelling and pain in right leg for 2 months. Right leg has pain in groin area and upper leg also. Swelling and pain are mostly from her knee down to foot. Swelling and pain are intermittent. Patient stated that she has not taken any medication and has only been resting her leg when she can.    she states her sister has history of DVTs now with chronic lymphedema.     Allergies  Allergen Reactions  . Codeine Hives  . Penicillins Swelling  . Sulfa Antibiotics     Other reaction(s): Skin Rashes  . Tetracycline Hives     Current Outpatient Prescriptions:  .  Aspirin 81 MG EC tablet, 1 tablet daily., Disp: , Rfl:  .  levothyroxine (SYNTHROID, LEVOTHROID) 50 MCG tablet, TAKE ONE TABLET BY MOUTH ONCE DAILY, Disp: 30 tablet, Rfl: 5 .  lovastatin (MEVACOR) 40 MG tablet, TAKE ONE TABLET BY MOUTH ONCE DAILY IN THE EVENING, Disp: 30 tablet, Rfl: 6 .  Multiple Vitamin (MULTIVITAMIN) capsule, Take 1 capsule by mouth daily., Disp: , Rfl:   Review of Systems  Constitutional: Negative for appetite change, chills, fatigue and fever.  Respiratory: Negative for chest tightness and shortness of breath.   Cardiovascular: Positive for leg swelling. Negative for chest pain and palpitations.  Gastrointestinal: Negative for abdominal pain, nausea and vomiting.  Neurological: Positive for dizziness. Negative for weakness.    Social History  Substance Use Topics  . Smoking status: Former Smoker    Packs/day: 0.50    Years: 9.00    Types: Cigarettes    Quit date: 04/26/1992  . Smokeless tobacco: Never Used     Comment: QUIT IN 1996  . Alcohol use No   Objective:   BP 124/68 (BP Location: Left Arm, Patient Position: Sitting, Cuff Size: Normal)   Pulse 68   Temp  98.1 F (36.7 C) (Oral)   Resp 16   Wt 175 lb (79.4 kg)   SpO2 95%   BMI 29.12 kg/m   Physical Exam  General appearance: alert, well developed, well nourished, cooperative and in no distress Head: Normocephalic, without obvious abnormality, atraumatic Respiratory: Respirations even and unlabored, normal respiratory rate Extremities: Slightly swollen right lower leg. No erythema. Slight tenderness posteriorly and medially up to knee. Moderate varicosities noted. Positive Homan's sign. No palpable cords.      Assessment & Plan:     1. Edema, unspecified type Does have family history of DVT.  - US Venous Img Lower Unilateral Right; Future  2. Right calf pain  3. Need for influenza vaccination  - Flu Vaccine QUAD 36+ mos IM       The entirety of the information documented in the History of Present Illness, Review of Systems and Physical Exam were personally obtained by me. Portions of this information were initially documented by Luddie Boghosian M. [name] Heck, CMA and reviewed by me for thoroughness and accuracy.    Lelon Huh, MD  [facility]

## 2016-08-02 ENCOUNTER — Ambulatory Visit
Admission: RE | Admit: 2016-08-02 | Discharge: 2016-08-02 | Disposition: A | Discharge status: Home / Self Care | Payer: PPO | Source: Ambulatory Visit | Attending: Family Medicine | Admitting: Family Medicine

## 2016-08-02 DIAGNOSIS — R609 Edema, unspecified: Secondary | ICD-10-CM | POA: Diagnosis not present

## 2016-08-02 DIAGNOSIS — R600 Localized edema: Secondary | ICD-10-CM | POA: Diagnosis not present

## 2016-09-08 ENCOUNTER — Other Ambulatory Visit: Payer: Self-pay

## 2016-09-08 DIAGNOSIS — R928 Other abnormal and inconclusive findings on diagnostic imaging of breast: Secondary | ICD-10-CM

## 2016-09-08 DIAGNOSIS — N6320 Unspecified lump in the left breast, unspecified quadrant: Secondary | ICD-10-CM

## 2016-09-28 ENCOUNTER — Inpatient Hospital Stay
Admission: RE | Admit: 2016-09-28 | Discharge: 2016-09-28 | Disposition: A | Discharge status: Home / Self Care | Payer: Self-pay | Source: Ambulatory Visit | Attending: *Deleted | Admitting: *Deleted

## 2016-09-28 ENCOUNTER — Other Ambulatory Visit: Payer: Self-pay | Admitting: *Deleted

## 2016-09-28 DIAGNOSIS — Z9289 Personal history of other medical treatment: Secondary | ICD-10-CM

## 2016-11-17 ENCOUNTER — Ambulatory Visit
Admission: RE | Admit: 2016-11-17 | Discharge: 2016-11-17 | Disposition: A | Discharge status: Home / Self Care | Payer: PPO | Source: Ambulatory Visit | Attending: General Surgery | Admitting: General Surgery

## 2016-11-17 DIAGNOSIS — N6320 Unspecified lump in the left breast, unspecified quadrant: Secondary | ICD-10-CM

## 2016-11-17 DIAGNOSIS — R928 Other abnormal and inconclusive findings on diagnostic imaging of breast: Secondary | ICD-10-CM

## 2016-11-20 ENCOUNTER — Encounter: Payer: Self-pay | Admitting: *Deleted

## 2016-11-23 ENCOUNTER — Ambulatory Visit: Payer: PPO | Admitting: General Surgery

## 2016-12-06 ENCOUNTER — Ambulatory Visit (INDEPENDENT_AMBULATORY_CARE_PROVIDER_SITE_OTHER): Payer: PPO | Admitting: Family Medicine

## 2016-12-06 ENCOUNTER — Ambulatory Visit (INDEPENDENT_AMBULATORY_CARE_PROVIDER_SITE_OTHER): Payer: PPO | Admitting: General Surgery

## 2016-12-06 ENCOUNTER — Encounter: Payer: Self-pay | Admitting: General Surgery

## 2016-12-06 ENCOUNTER — Encounter: Payer: Self-pay | Admitting: Family Medicine

## 2016-12-06 VITALS — BP 124/72 | HR 80 | Resp 12 | Ht 65.0 in | Wt 171.0 lb

## 2016-12-06 VITALS — BP 120/76 | HR 72 | Temp 97.8°F | Resp 16 | Ht 65.0 in | Wt 171.0 lb

## 2016-12-06 DIAGNOSIS — E2839 Other primary ovarian failure: Secondary | ICD-10-CM

## 2016-12-06 DIAGNOSIS — E039 Hypothyroidism, unspecified: Secondary | ICD-10-CM

## 2016-12-06 DIAGNOSIS — Z23 Encounter for immunization: Secondary | ICD-10-CM | POA: Diagnosis not present

## 2016-12-06 DIAGNOSIS — R928 Other abnormal and inconclusive findings on diagnostic imaging of breast: Secondary | ICD-10-CM | POA: Diagnosis not present

## 2016-12-06 DIAGNOSIS — Z1211 Encounter for screening for malignant neoplasm of colon: Secondary | ICD-10-CM | POA: Diagnosis not present

## 2016-12-06 DIAGNOSIS — R7303 Prediabetes: Secondary | ICD-10-CM | POA: Diagnosis not present

## 2016-12-06 DIAGNOSIS — E7800 Pure hypercholesterolemia, unspecified: Secondary | ICD-10-CM | POA: Diagnosis not present

## 2016-12-06 DIAGNOSIS — R232 Flushing: Secondary | ICD-10-CM

## 2016-12-06 LAB — POCT GLYCOSYLATED HEMOGLOBIN (HGB A1C)
Est. average glucose Bld gHb Est-mCnc: 128
Hemoglobin A1C: 6.1

## 2016-12-06 NOTE — Progress Notes (Signed)
Patient: Melinda Mack Female    DOB: [birth year]   66 y.o.   MRN: [id number] Visit Date: 12/06/2016  Today's Provider: Lelon Huh, MD   Chief Complaint  Patient presents with  . Hypothyroidism    follow up  . Hyperglycemia    follow up  . Hyperlipidemia    follow up   Subjective:    HPI Follow up Hypothyroidism: Patient was last seen for this problem over 1 year ago and no changes were made. Patient reports good compliance with treatment and good tolerance.  Lab Results  Component Value Date   TSH 2.690 06/16/2015       Prediabetes, Follow-up:   Lab Results  Component Value Date   HGBA1C 6.1 (H) 06/16/2015   HGBA1C 6.3 (A) 09/01/2013   GLUCOSE 127 (H) 06/16/2015   GLUCOSE 127 (H) 04/23/2014   GLUCOSE 101 (H) 02/12/2012    Last seen for for this greater than 1 years ago.  Management since that visit includes advising patient to avoid sweets and starchy foods. Current symptoms include none and have been stable.  Weight trend: fluctuating a bit Prior visit with dietician: no Current diet: well balanced Current exercise: none  Pertinent Labs:    Component Value Date/Time   CHOL 197 06/16/2015 0931   TRIG 338 (H) 06/16/2015 0931   CHOLHDL 4.8 (H) 06/16/2015 0931   CREATININE 0.96 06/16/2015 0931   CREATININE 0.98 04/23/2014 1212    Wt Readings from Last 3 Encounters:  12/06/16 171 lb (77.6 kg)  08/01/16 175 lb (79.4 kg)  05/19/16 177 lb 9.6 oz (80.6 kg)    Lipid/Cholesterol, Follow-up:   Last seen for this more than 1 years ago.  Management changes since that visit include none. . Last Lipid Panel:    Component Value Date/Time   CHOL 197 06/16/2015 0931   TRIG 338 (H) 06/16/2015 0931   HDL 41 06/16/2015 0931   CHOLHDL 4.8 (H) 06/16/2015 0931   LDLCALC 88 06/16/2015 0931    Risk factors for vascular disease include hypercholesterolemia and hypertension  She reports good compliance with treatment. She is not having side  effects.  Current symptoms include none and have been stable. Weight trend: fluctuating a bit Prior visit with dietician: no Current diet: well balanced Current exercise: none  Wt Readings from Last 3 Encounters:  12/06/16 171 lb (77.6 kg)  08/01/16 175 lb (79.4 kg)  05/19/16 177 lb 9.6 oz (80.6 kg)    -------------------------------------------------------------------     Allergies  Allergen Reactions  . Codeine Hives  . Penicillins Swelling  . Sulfa Antibiotics     Other reaction(s): Skin Rashes  . Tetracycline Hives     Current Outpatient Prescriptions:  .  Aspirin 81 MG EC tablet, 1 tablet daily., Disp: , Rfl:  .  levothyroxine (SYNTHROID, LEVOTHROID) 50 MCG tablet, TAKE ONE TABLET BY MOUTH ONCE DAILY, Disp: 30 tablet, Rfl: 5 .  lovastatin (MEVACOR) 40 MG tablet, TAKE ONE TABLET BY MOUTH ONCE DAILY IN THE EVENING, Disp: 30 tablet, Rfl: 6 .  Multiple Vitamin (MULTIVITAMIN) capsule, Take 1 capsule by mouth daily., Disp: , Rfl:   Review of Systems  Constitutional: Negative for appetite change, chills, fatigue and fever.  Respiratory: Negative for chest tightness and shortness of breath.   Cardiovascular: Negative for chest pain and palpitations.  Gastrointestinal: Negative for abdominal pain, nausea and vomiting.  Neurological: Negative for dizziness and weakness.    Social History  Substance Use Topics  .  Smoking status: Former Smoker    Packs/day: 0.50    Years: 9.00    Types: Cigarettes    Quit date: 04/26/1992  . Smokeless tobacco: Never Used     Comment: QUIT IN 1996  . Alcohol use No   Objective:   BP 120/76 (BP Location: Left Arm, Patient Position: Sitting, Cuff Size: Normal)   Pulse 72   Temp 97.8 F (36.6 C) (Oral)   Resp 16   Ht 5' 5"  (1.651 m)   Wt 171 lb (77.6 kg)   SpO2 96% Comment: room air  BMI 28.46 kg/m   Physical Exam   General Appearance:    Alert, cooperative, no distress  Eyes:    PERRL, conjunctiva/corneas clear, EOM's intact        Lungs:     Clear to auscultation bilaterally, respirations unlabored  Heart:    Regular rate and rhythm  Neurologic:   Awake, alert, oriented x 3. No apparent focal neurological           defect.       Results for orders placed or performed in visit on 12/06/16  POCT HgB A1C  Result Value Ref Range   Hemoglobin A1C 6.1    Est. average glucose Bld gHb Est-mCnc 128         Assessment & Plan:     1. Pre-diabetes Stable. Continue low glycemic index diet.  - POCT HgB A1C - Renal function panel  2. Adult hypothyroidism  - T4 AND TSH  3. Hypercholesterolemia without hypertriglyceridemia She is tolerating lovastatin well with no adverse effects.   - Lipid panel - Renal function panel  4. Need for pneumococcal vaccination  - Pneumococcal conjugate vaccine 13-valent IM  5. Estrogen deficiency  - DG Bone Density; Future  6. [place] Likely allergic versus stress triggered. Having episodes every week or so and they usually resolve with Benadryl. Advised to avoid OTC supplements, can take daily non-sedating antihistamine if episodes become more frequent.   7. Colon cancer screening Patient sent with iFOBT collection kit.        Lelon Huh, MD  [facility]

## 2016-12-06 NOTE — Patient Instructions (Addendum)
Patient to return as needed. Followed up with PCP or screening mammograms and breast checks.

## 2016-12-06 NOTE — Progress Notes (Signed)
Patient ID: Melinda Mack, female   DOB: 11-11-1950, 66 y.o.   MRN: [id number]  Chief Complaint  Patient presents with  . Follow-up    HPI Melinda Mack is a 66 y.o. female who presents for a breast evaluation. The most recent mammogram was done on 11/17/2016.  Patient does perform regular self breast checks and gets regular mammograms done.    HPI  Past Medical History:  Diagnosis Date  . Cancer ([place]) 03/2012   uterine  . History of chicken pox   . History of measles   . History of mumps   . Hypercholesterolemia   . Hypothyroidism 2013    Past Surgical History:  Procedure Laterality Date  . ABDOMINAL HYSTERECTOMY  04/18/2012   cancer at [facility]  . BILATERAL SALPINGOOPHORECTOMY  6/20123  . BREAST BIOPSY Left 05/08/2016   neg- Melinda Mack  . BREAST BIOPSY Right 10/2015   neg- Melinda Mack  . LYMPHADENECTOMY  03/2012  . 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 TONSILLECTOMY AND ADENOIDECTOMY  1970  . TUBAL LIGATION  1978    Family History  Problem Relation Age of Onset  . Lung disease Mother   . Lung disease Father   . Healthy Sister   . Healthy Sister   . Heart attack Maternal Grandmother   . Alzheimer's disease Paternal Grandmother   . Coronary artery disease Other   . Hypertension Maternal Uncle   . Breast cancer Paternal Aunt 89  . Lung cancer Maternal Uncle 71    Social History Social History  Substance Use Topics  . Smoking status: Former Smoker    Packs/day: 0.50    Years: 9.00    Types: Cigarettes    Quit date: 04/26/1992  . Smokeless tobacco: Never Used     Comment: QUIT IN 1996  . Alcohol use No    Allergies  Allergen Reactions  . Codeine Hives  . Penicillins Swelling  . Sulfa Antibiotics     Other reaction(s): Skin Rashes  . Tetracycline Hives    Current Outpatient Prescriptions  Medication Sig Dispense Refill  . Aspirin 81 MG EC tablet 1 tablet daily.    Marland Kitchen levothyroxine (SYNTHROID, LEVOTHROID) 50 MCG tablet TAKE ONE TABLET BY MOUTH  ONCE DAILY 30 tablet 5  . lovastatin (MEVACOR) 40 MG tablet TAKE ONE TABLET BY MOUTH ONCE DAILY IN THE EVENING 30 tablet 6  . Multiple Vitamin (MULTIVITAMIN) capsule Take 1 capsule by mouth daily.     No current facility-administered medications for this visit.     Review of Systems Review of Systems  Constitutional: Negative.   Respiratory: Negative.   Cardiovascular: Negative.     Blood pressure 124/72, pulse 80, resp. rate 12, height 5\' 5"  (1.651 m), weight 171 lb (77.6 kg).  Physical Exam Physical Exam  Constitutional: She is oriented to person, place, and time. She appears well-developed and well-nourished.  Eyes: Conjunctivae are normal. No scleral icterus.  Neck: Neck supple.  Cardiovascular: Normal rate, regular rhythm and normal heart sounds.   Pulmonary/Chest: Effort normal and breath sounds normal. Right breast exhibits no inverted nipple, no mass, no nipple discharge, no skin change and no tenderness. Left breast exhibits no inverted nipple, no mass, no nipple discharge, no skin change and no tenderness.  Abdominal: Soft. Bowel sounds are normal. There is no tenderness.  Lymphadenopathy:    She has no cervical adenopathy.    She has no axillary adenopathy.  Neurological: She is alert and oriented to person, place,  and time.  Skin: Skin is warm and dry.    Data Reviewed Bilateral mammograms dated 11/17/2016 reviewed. No interval change in the previously biopsied mass compared to 2014. Left breast lesion biopsy July 2017 is no longer identified consistent with biopsy. BI-RADS-2.  Assessment    Unremarkable breast exam and normal mammogram.    Plan      Patient to return as needed. Follow up with PCP for screening mammograms and breast checks.   This information has been scribed by Gaspar Cola CMA.   Robert Bellow 12/07/2016, 3:35 PM

## 2016-12-07 ENCOUNTER — Other Ambulatory Visit (INDEPENDENT_AMBULATORY_CARE_PROVIDER_SITE_OTHER): Payer: PPO | Admitting: *Deleted

## 2016-12-07 DIAGNOSIS — Z1211 Encounter for screening for malignant neoplasm of colon: Secondary | ICD-10-CM

## 2016-12-07 LAB — IFOBT (OCCULT BLOOD): IMMUNOLOGICAL FECAL OCCULT BLOOD TEST: NEGATIVE

## 2016-12-08 DIAGNOSIS — E039 Hypothyroidism, unspecified: Secondary | ICD-10-CM | POA: Diagnosis not present

## 2016-12-08 DIAGNOSIS — R7303 Prediabetes: Secondary | ICD-10-CM | POA: Diagnosis not present

## 2016-12-08 DIAGNOSIS — E7800 Pure hypercholesterolemia, unspecified: Secondary | ICD-10-CM | POA: Diagnosis not present

## 2016-12-09 LAB — RENAL FUNCTION PANEL
ALBUMIN: 4.4 g/dL (ref 3.6–4.8)
BUN/Creatinine Ratio: 18 (ref 12–28)
BUN: 20 mg/dL (ref 8–27)
CALCIUM: 9.9 mg/dL (ref 8.7–10.3)
CHLORIDE: 104 mmol/L (ref 96–106)
CO2: 26 mmol/L (ref 18–29)
Creatinine, Ser: 1.09 mg/dL — ABNORMAL HIGH (ref 0.57–1.00)
GFR calc Af Amer: 62 mL/min/{1.73_m2} (ref >59)
GFR, EST NON AFRICAN AMERICAN: 53 mL/min/{1.73_m2} — AB (ref >59)
GLUCOSE: 115 mg/dL — AB (ref 65–99)
PHOSPHORUS: 3.4 mg/dL (ref 2.5–4.5)
POTASSIUM: 4.6 mmol/L (ref 3.5–5.2)
SODIUM: 145 mmol/L — AB (ref 134–144)

## 2016-12-09 LAB — T4 AND TSH
T4, Total: 9.3 ug/dL (ref 4.5–12.0)
TSH: 2.35 u[IU]/mL (ref 0.450–4.500)

## 2016-12-09 LAB — LIPID PANEL
Chol/HDL Ratio: 3.9 ratio units (ref 0.0–4.4)
Cholesterol, Total: 182 mg/dL (ref 100–199)
HDL: 47 mg/dL (ref >39)
LDL Calculated: 96 mg/dL (ref 0–99)
Triglycerides: 193 mg/dL — ABNORMAL HIGH (ref 0–149)
VLDL Cholesterol Cal: 39 mg/dL (ref 5–40)

## 2016-12-27 ENCOUNTER — Other Ambulatory Visit: Payer: Self-pay

## 2016-12-27 MED ORDER — LOVASTATIN 40 MG PO TABS: 40.0000 mg | ORAL_TABLET | Freq: Every day | ORAL | 4 refills | Status: DC

## 2016-12-27 NOTE — Telephone Encounter (Signed)
Pharmacy requesting 90 day supply. Pharmacy listed is correct. Thanks!

## 2017-01-30 ENCOUNTER — Ambulatory Visit
Admission: RE | Admit: 2017-01-30 | Discharge: 2017-01-30 | Disposition: A | Discharge status: Home / Self Care | Payer: PPO | Source: Ambulatory Visit | Attending: Family Medicine | Admitting: Family Medicine

## 2017-01-30 DIAGNOSIS — M8588 Other specified disorders of bone density and structure, other site: Secondary | ICD-10-CM | POA: Diagnosis not present

## 2017-01-30 DIAGNOSIS — E2839 Other primary ovarian failure: Secondary | ICD-10-CM

## 2017-01-31 ENCOUNTER — Encounter: Payer: Self-pay | Admitting: Family Medicine

## 2017-01-31 DIAGNOSIS — M8580 Other specified disorders of bone density and structure, unspecified site: Secondary | ICD-10-CM | POA: Insufficient documentation

## 2017-01-31 NOTE — Progress Notes (Signed)
Advised  ED 

## 2017-02-03 ENCOUNTER — Other Ambulatory Visit: Payer: Self-pay | Admitting: Family Medicine

## 2017-03-12 ENCOUNTER — Telehealth: Payer: Self-pay | Admitting: Family Medicine

## 2017-03-12 NOTE — Telephone Encounter (Signed)
Patient contacted to schedule AWV.  Has HTA so needs to be separate from MD visit-ab

## 2017-04-17 ENCOUNTER — Ambulatory Visit (INDEPENDENT_AMBULATORY_CARE_PROVIDER_SITE_OTHER): Payer: PPO | Admitting: Family Medicine

## 2017-04-17 VITALS — BP 110/62 | HR 63 | Temp 98.6°F | Resp 16 | Wt 178.0 lb

## 2017-04-17 DIAGNOSIS — R42 Dizziness and giddiness: Secondary | ICD-10-CM

## 2017-04-17 DIAGNOSIS — F439 Reaction to severe stress, unspecified: Secondary | ICD-10-CM

## 2017-04-17 MED ORDER — SERTRALINE HCL 50 MG PO TABS: 50.0000 mg | ORAL_TABLET | Freq: Every day | ORAL | 0 refills | Status: DC

## 2017-04-17 NOTE — Progress Notes (Signed)
Subjective:     Patient ID: Melinda Mack, female   DOB: [birth year], 66 y.o.   MRN: [id number]  HPI  Chief Complaint  Patient presents with  . Dizziness    Patient comes in office today with complaints of feeling lightheaded with intermittent episodes since 2015, patient states over the past few weeks she has had more episodes than before. Patient states asssociated with feeling light headed she has had cramping in both legs, fatigue and difficulty taking in a deep breath.   States at times she feels like she might pass out but has not grayed out or become syncopal: I feel like I am drained." Reports the episodes can last anywhere from seconds to minutes. States she has gotten to the point she does not like to go out as she is concerned she will have an episode. She is a a widow and did not remarry. States she was the caregiver for her husband who had brain cancer: "It took me 8 years to pay off the bills". She is concerned about her own retirement.Currently working as a Editor, commissioning. No ETOH or tobacco use. Has had prior workups with CT scan, ENT referral, and Holter monitor.   Review of Systems     Objective:   Physical Exam  Constitutional: She appears well-developed and well-nourished. No distress.  Cardiovascular: Normal rate and regular rhythm.   Pulmonary/Chest: Breath sounds normal.  Musculoskeletal: She exhibits no edema (of lower extremities).       Assessment:    1. Dizziness: suspect stress/anxiety equivalent - CBC with Differential/Platelet - Comprehensive metabolic panel  2. Situational stress - sertraline (ZOLOFT) 50 MG tablet; Take 1 tablet (50 mg total) by mouth daily. Start at 1/2 pill for the first 5 days then a whole pill daily  Dispense: 30 tablet; Refill: 0    Plan:    Further f/u pending lab results.

## 2017-04-17 NOTE — Patient Instructions (Addendum)
We will call you with the lab results. Let me know if you can't tolerate the sertraline.

## 2017-04-24 ENCOUNTER — Ambulatory Visit (INDEPENDENT_AMBULATORY_CARE_PROVIDER_SITE_OTHER): Payer: PPO

## 2017-04-24 VITALS — BP 108/68 | HR 64 | Temp 98.5°F | Ht 65.0 in | Wt 178.0 lb

## 2017-04-24 DIAGNOSIS — Z0000 Encounter for general adult medical examination without abnormal findings: Secondary | ICD-10-CM

## 2017-04-24 NOTE — Progress Notes (Signed)
Subjective:   Melinda Mack is a 66 y.o. female who presents for an Initial Medicare Annual Wellness Visit.  Review of Systems    N/A  Cardiac Risk Factors include: advanced age (>52men, >32 women);dyslipidemia     Objective:    Today's Vitals   04/24/17 0954 04/24/17 0958  BP: 108/68   Pulse: 64   Temp: 98.5 F (36.9 C)   TempSrc: Oral   Weight: 178 lb (80.7 kg)   Height: 5\' 5"  (1.651 m)   PainSc: 0-No pain 0-No pain   Body mass index is 29.62 kg/m.   Current Medications (verified) Outpatient Encounter Prescriptions as of 04/24/2017  Medication Sig  . Aspirin 81 MG EC tablet 1 tablet daily.  Marland Kitchen levothyroxine (SYNTHROID, LEVOTHROID) 50 MCG tablet TAKE ONE TABLET BY MOUTH ONCE DAILY  . lovastatin (MEVACOR) 40 MG tablet Take 1 tablet (40 mg total) by mouth at bedtime.  . Multiple Vitamin (MULTIVITAMIN) capsule Take 1 capsule by mouth daily.  . sertraline (ZOLOFT) 50 MG tablet Take 1 tablet (50 mg total) by mouth daily. Start at 1/2 pill for the first 5 days then a whole pill daily  . [DISCONTINUED] levothyroxine (SYNTHROID, LEVOTHROID) 50 MCG tablet TAKE ONE TABLET BY MOUTH ONCE DAILY   No facility-administered encounter medications on file as of 04/24/2017.     Allergies (verified) Codeine; Penicillins; Sulfa antibiotics; and Tetracycline   History: Past Medical History:  Diagnosis Date  . Cancer ([place]) 03/2012   uterine  . History of chicken pox   . History of measles   . History of mumps   . Hypercholesterolemia   . Hypothyroidism 2013   Past Surgical History:  Procedure Laterality Date  . ABDOMINAL HYSTERECTOMY  04/18/2012   cancer at [facility]  . BILATERAL SALPINGOOPHORECTOMY  6/20123  . BREAST BIOPSY Left 05/08/2016   neg- [place]  . BREAST BIOPSY Right 10/2015   neg- [place]  . LYMPHADENECTOMY  03/2012  . 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 TONSILLECTOMY AND ADENOIDECTOMY  1970  . TUBAL LIGATION  1978   Family History  Problem Relation Age of  Onset  . Lung disease Mother   . COPD Mother   . Hypertension Mother   . Heart disease Mother   . Lung disease Father   . Healthy Sister   . Healthy Sister   . Heart attack Maternal Grandmother   . Alzheimer's disease Paternal Grandmother   . Coronary artery disease Other   . Hypertension Maternal Uncle   . Breast cancer Paternal Aunt 69  . Lung cancer Maternal Uncle 71   Social History   Occupational History  .      [employer]   Social History Main Topics  . Smoking status: Former Smoker    Packs/day: 0.50    Years: 9.00    Types: Cigarettes    Quit date: 04/26/1992  . Smokeless tobacco: Never Used     Comment: QUIT IN 1996  . Alcohol use No  . Drug use: No  . Sexual activity: Not on file    Tobacco Counseling Counseling given: Not Answered   Activities of Daily Living In your present state of health, do you have any difficulty performing the following activities: 04/24/2017  Hearing? N  Vision? Y  Difficulty concentrating or making decisions? N  Walking or climbing stairs? N  Dressing or bathing? N  Doing errands, shopping? N  Preparing Food and eating ? N  Using the Toilet? N  In the past six months, have you accidently leaked urine? Y  Do you have problems with loss of bowel control? N  Managing your Medications? N  Managing your Finances? N  Housekeeping or managing your Housekeeping? N  Some recent data might be hidden    Immunizations and Health Maintenance Immunization History  Administered Date(s) Administered  . Influenza,inj,Quad PF,36+ Mos 06/16/2015, 08/01/2016  . Pneumococcal Conjugate-13 12/06/2016  . Tdap 01/21/2009  . Zoster 08/28/2011   Health Maintenance Due  Topic Date Due  . COLONOSCOPY  04/13/2001    Patient Care Team: Birdie Sons, MD as PCP - General (Family Medicine) Anell Barr, [place] as Consulting Physician (Optometry) Carmon Ginsberg, [place] as Referring Physician (Family Medicine)  Indicate any recent  Medical Services you may have received from other than [facility] providers in the past year (date may be approximate).     Assessment:   This is a routine wellness examination for Melinda Mack.   Hearing/Vision screen Vision Screening Comments: Pt sees Dr Ellin Mayhew for vision checks every 2 years.  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 Cut out extra servings          Recommend cutting out junk food and sweets in daily diet. Pt to focus on protein snacks in between meals.       Depression Screen PHQ 2/9 Scores 04/24/2017 04/17/2017  PHQ - 2 Score 2 1  PHQ- 9 Score 8 -    Fall Risk Fall Risk  04/24/2017 04/17/2017 07/05/2015  Falls in the past year? No No No    Cognitive Function:     6CIT Screen 04/24/2017  What Year? 0 points  What month? 0 points  What time? 0 points  Count back from 20 0 points  Months in reverse 0 points  Repeat phrase 0 points  Total Score 0    Screening Tests Health Maintenance  Topic Date Due  . COLONOSCOPY  04/13/2001  . INFLUENZA VACCINE  05/23/2017  . PNA vac Low Risk Adult (2 of 2 - PPSV23) 12/06/2017  . MAMMOGRAM  11/17/2018  . TETANUS/TDAP  01/22/2019  . DEXA SCAN  Completed  . Hepatitis C Screening  Completed      Plan:  I have personally reviewed and addressed the Medicare Annual Wellness questionnaire and have noted the following in the patient's chart:  A. Medical and social history B. Use of alcohol, tobacco or illicit drugs  C. Current medications and supplements D. Functional ability and status E.  Nutritional status F.  Physical activity G. Advance directives H. List of other physicians I.  Hospitalizations, surgeries, and ER visits in previous 12 months J.  [place] such as hearing and vision if needed, cognitive and  depression L. Referrals and appointments - none  In addition, I have reviewed and discussed with patient certain preventive protocols, quality metrics, and best practice recommendations. A written personalized care plan for preventive services as well as general preventive health recommendations were provided to patient.  See attached scanned questionnaire for additional information.   Signed,  Fabio Neighbors, LPN Nurse Health Advisor   MD Recommendations: Pt declined colonoscopy referral today. Pt states she completed the iFOBT after last visit. Please follow up on this with patient and update health maintanance in chart.

## 2017-04-24 NOTE — Patient Instructions (Signed)
Melinda Mack , Thank you for taking time to come for your Medicare Wellness Visit. I appreciate your ongoing commitment to your health goals. Please review the following plan we discussed and let me know if I can assist you in the future.   Screening recommendations/referrals: Colonoscopy: declined referral today Mammogram: completed 11/17/16, due 10/2017 Bone Density: completed 01/30/17 Recommended yearly ophthalmology/optometry visit for glaucoma screening and checkup Recommended yearly dental visit for hygiene and checkup  Vaccinations: Influenza vaccine: due 06/2017 Pneumococcal vaccine: Prevnar 13 given 12/06/16, Pneumovax 23 due 02/03/18 Tdap vaccine: completed 01/21/09, due 01/2019 Shingles vaccine: completed 08/28/11  Advanced directives: Advance directive discussed with you today. I have provided a copy for you to complete at home and have notarized. Once this is complete please bring a copy in to our office so we can scan it into your chart.  Conditions/risks identified: Recommend cutting out junk food and sweets in daily diet. Pt to focus on protein snacks in between meals.  Next appointment: 05/01/17 @ 8:00 AM   Preventive Care 65 Years and Older, Female Preventive care refers to lifestyle choices and visits with your health care provider that can promote health and wellness. What does preventive care include?  A yearly physical exam. This is also called an annual well check.  Dental exams once or twice a year.  Routine eye exams. Ask your health care provider how often you should have your eyes checked.  Personal lifestyle choices, including:  Daily care of your teeth and gums.  Regular physical activity.  Eating a healthy diet.  Avoiding tobacco and drug use.  Limiting alcohol use.  Practicing safe sex.  Taking low-dose aspirin every day.  Taking vitamin and mineral supplements as recommended by your health care provider. What happens during an annual well  check? The services and screenings done by your health care provider during your annual well check will depend on your age, overall health, lifestyle risk factors, and family history of disease. Counseling  Your health care provider may ask you questions about your:  Alcohol use.  Tobacco use.  Drug use.  Emotional well-being.  Home and relationship well-being.  Sexual activity.  Eating habits.  History of falls.  Memory and ability to understand (cognition).  Work and work Statistician.  Reproductive health. Screening  You may have the following tests or measurements:  Height, weight, and BMI.  Blood pressure.  Lipid and cholesterol levels. These may be checked every 5 years, or more frequently if you are over 97 years old.  Skin check.  Lung cancer screening. You may have this screening every year starting at age 19 if you have a 30-pack-year history of smoking and currently smoke or have quit within the past 15 years.  Fecal occult blood test (FOBT) of the stool. You may have this test every year starting at age 15.  Flexible sigmoidoscopy or colonoscopy. You may have a sigmoidoscopy every 5 years or a colonoscopy every 10 years starting at age 57.  Hepatitis C blood test.  Hepatitis B blood test.  Sexually transmitted disease (STD) testing.  Diabetes screening. This is done by checking your blood sugar (glucose) after you have not eaten for a while (fasting). You may have this done every 1-3 years.  Bone density scan. This is done to screen for osteoporosis. You may have this done starting at age 50.  Mammogram. This may be done every 1-2 years. Talk to your health care provider about how often you should have regular  mammograms. Talk with your health care provider about your test results, treatment options, and if necessary, the need for more tests. Vaccines  Your health care provider may recommend certain vaccines, such as:  Influenza vaccine. This is  recommended every year.  Tetanus, diphtheria, and acellular pertussis (Tdap, Td) vaccine. You may need a Td booster every 10 years.  Zoster vaccine. You may need this after age 62.  Pneumococcal 13-valent conjugate (PCV13) vaccine. One dose is recommended after age 65.  Pneumococcal polysaccharide (PPSV23) vaccine. One dose is recommended after age 82. Talk to your health care provider about which screenings and vaccines you need and how often you need them. This information is not intended to replace advice given to you by your health care provider. Make sure you discuss any questions you have with your health care provider. Document Released: 11/05/2015 Document Revised: 06/28/2016 Document Reviewed: 08/10/2015 Elsevier Interactive Patient Education  2017 [place] Prevention in the Home Falls can cause injuries. They can happen to people of all ages. There are many things you can do to make your home safe and to help prevent falls. What can I do on the outside of my home?  Regularly fix the edges of walkways and driveways and fix any cracks.  Remove anything that might make you trip as you walk through a door, such as a raised step or threshold.  Trim any bushes or trees on the path to your home.  Use bright outdoor lighting.  Clear any walking paths of anything that might make someone trip, such as rocks or tools.  Regularly check to see if handrails are loose or broken. Make sure that both sides of any steps have handrails.  Any raised decks and porches should have guardrails on the edges.  Have any leaves, snow, or ice cleared regularly.  Use sand or salt on walking paths during winter.  Clean up any spills in your garage right away. This includes oil or grease spills. What can I do in the bathroom?  Use night lights.  Install grab bars by the toilet and in the tub and shower. Do not use towel bars as grab bars.  Use non-skid mats or decals in the tub or  shower.  If you need to sit down in the shower, use a plastic, non-slip stool.  Keep the floor dry. Clean up any water that spills on the floor as soon as it happens.  Remove soap buildup in the tub or shower regularly.  Attach bath mats securely with double-sided non-slip rug tape.  Do not have throw rugs and other things on the floor that can make you trip. What can I do in the bedroom?  Use night lights.  Make sure that you have a light by your bed that is easy to reach.  Do not use any sheets or blankets that are too big for your bed. They should not hang down onto the floor.  Have a firm chair that has side arms. You can use this for support while you get dressed.  Do not have throw rugs and other things on the floor that can make you trip. What can I do in the kitchen?  Clean up any spills right away.  Avoid walking on wet floors.  Keep items that you use a lot in easy-to-reach places.  If you need to reach something above you, use a strong step stool that has a grab bar.  Keep electrical cords out of the way.  Do not use floor polish or wax that makes floors slippery. If you must use wax, use non-skid floor wax.  Do not have throw rugs and other things on the floor that can make you trip. What can I do with my stairs?  Do not leave any items on the stairs.  Make sure that there are handrails on both sides of the stairs and use them. Fix handrails that are broken or loose. Make sure that handrails are as long as the stairways.  Check any carpeting to make sure that it is firmly attached to the stairs. Fix any carpet that is loose or worn.  Avoid having throw rugs at the top or bottom of the stairs. If you do have throw rugs, attach them to the floor with carpet tape.  Make sure that you have a light switch at the top of the stairs and the bottom of the stairs. If you do not have them, ask someone to add them for you. What else can I do to help prevent  falls?  Wear shoes that:  Do not have high heels.  Have rubber bottoms.  Are comfortable and fit you well.  Are closed at the toe. Do not wear sandals.  If you use a stepladder:  Make sure that it is fully opened. Do not climb a closed stepladder.  Make sure that both sides of the stepladder are locked into place.  Ask someone to hold it for you, if possible.  Clearly mark and make sure that you can see:  Any grab bars or handrails.  First and last steps.  Where the edge of each step is.  Use tools that help you move around (mobility aids) if they are needed. These include:  Canes.  Walkers.  Scooters.  Crutches.  Turn on the lights when you go into a dark area. Replace any light bulbs as soon as they burn out.  Set up your furniture so you have a clear path. Avoid moving your furniture around.  If any of your floors are uneven, fix them.  If there are any pets around you, be aware of where they are.  Review your medicines with your doctor. Some medicines can make you feel dizzy. This can increase your chance of falling. Ask your doctor what other things that you can do to help prevent falls. This information is not intended to replace advice given to you by your health care provider. Make sure you discuss any questions you have with your health care provider. Document Released: 08/05/2009 Document Revised: 03/16/2016 Document Reviewed: 11/13/2014 Elsevier Interactive Patient Education  2017 [employer].

## 2017-05-01 ENCOUNTER — Ambulatory Visit (INDEPENDENT_AMBULATORY_CARE_PROVIDER_SITE_OTHER): Payer: PPO | Admitting: Family Medicine

## 2017-05-01 ENCOUNTER — Encounter: Payer: Self-pay | Admitting: Family Medicine

## 2017-05-01 VITALS — BP 108/70 | HR 67 | Temp 98.3°F | Resp 16 | Wt 177.4 lb

## 2017-05-01 DIAGNOSIS — R42 Dizziness and giddiness: Secondary | ICD-10-CM | POA: Diagnosis not present

## 2017-05-01 DIAGNOSIS — F439 Reaction to severe stress, unspecified: Secondary | ICD-10-CM

## 2017-05-01 MED ORDER — SERTRALINE HCL 50 MG PO TABS: 50.0000 mg | ORAL_TABLET | Freq: Every day | ORAL | 0 refills | Status: DC

## 2017-05-01 NOTE — Patient Instructions (Signed)
Call me on how you are doing on the medication before you run out. We will call you with the lab results.

## 2017-05-01 NOTE — Progress Notes (Signed)
Subjective:     Patient ID: Melinda Mack, female   DOB: 10/14/1951, 66 y.o.   MRN: [id number]  HPI  Chief Complaint  Patient presents with  . Stress    Patient returns back to office today for follow up situational stress, last office visit was 04/17/17 patient was started on Zoloft 50mg . Patient reports that she has had good compliance on medication but she still has a feeling at times of being flushed and wanting to faint. Patient denies any syncope episodes, or panic attacks or increased new stressors.    Reports she has been on a whole pill for 5 days. Has noticed some improvement in her feeling of needing an extra breath and less episodes of feeling flushed. Remarks today: "I have to fire someone today." Suggested she will have more improvement of her physical sx in the weeks to come.    Review of Systems     Objective:   Physical Exam  Constitutional: She appears well-developed and well-nourished. No distress.  Psychiatric:  Stressed appearance       Assessment:    1. Situational stress - sertraline (ZOLOFT) 50 MG tablet; Take 1 tablet (50 mg total) by mouth daily. Take a whole pill daily  Dispense: 30 tablet; Refill: 0    Plan:    Phone f/u in 2-3 weeks.

## 2017-05-02 ENCOUNTER — Telehealth: Payer: Self-pay

## 2017-05-02 LAB — CBC WITH DIFFERENTIAL/PLATELET
BASOS: 0 %
Basophils Absolute: 0 10*3/uL (ref 0.0–0.2)
EOS (ABSOLUTE): 0.3 10*3/uL (ref 0.0–0.4)
Eos: 4 %
Hematocrit: 41.7 % (ref 34.0–46.6)
Hemoglobin: 13.6 g/dL (ref 11.1–15.9)
IMMATURE GRANS (ABS): 0 10*3/uL (ref 0.0–0.1)
Immature Granulocytes: 0 %
LYMPHS ABS: 1.4 10*3/uL (ref 0.7–3.1)
LYMPHS: 20 %
MCH: 29.6 pg (ref 26.6–33.0)
MCHC: 32.6 g/dL (ref 31.5–35.7)
MCV: 91 fL (ref 79–97)
MONOS ABS: 0.7 10*3/uL (ref 0.1–0.9)
Monocytes: 10 %
NEUTROS ABS: 4.5 10*3/uL (ref 1.4–7.0)
Neutrophils: 66 %
PLATELETS: 267 10*3/uL (ref 150–379)
RBC: 4.6 x10E6/uL (ref 3.77–5.28)
RDW: 14.1 % (ref 12.3–15.4)
WBC: 6.9 10*3/uL (ref 3.4–10.8)

## 2017-05-02 LAB — COMPREHENSIVE METABOLIC PANEL
A/G RATIO: 1.9 (ref 1.2–2.2)
ALT: 24 IU/L (ref 0–32)
AST: 21 IU/L (ref 0–40)
Albumin: 4.6 g/dL (ref 3.6–4.8)
Alkaline Phosphatase: 92 IU/L (ref 39–117)
BUN/Creatinine Ratio: 17 (ref 12–28)
BUN: 19 mg/dL (ref 8–27)
Bilirubin Total: 0.4 mg/dL (ref 0.0–1.2)
CALCIUM: 10 mg/dL (ref 8.7–10.3)
CO2: 23 mmol/L (ref 20–29)
CREATININE: 1.14 mg/dL — AB (ref 0.57–1.00)
Chloride: 102 mmol/L (ref 96–106)
GFR, EST AFRICAN AMERICAN: 58 mL/min/{1.73_m2} — AB (ref >59)
GFR, EST NON AFRICAN AMERICAN: 50 mL/min/{1.73_m2} — AB (ref >59)
GLOBULIN, TOTAL: 2.4 g/dL (ref 1.5–4.5)
Glucose: 115 mg/dL — ABNORMAL HIGH (ref 65–99)
Potassium: 4.3 mmol/L (ref 3.5–5.2)
Sodium: 142 mmol/L (ref 134–144)
TOTAL PROTEIN: 7 g/dL (ref 6.0–8.5)

## 2017-05-02 NOTE — Telephone Encounter (Signed)
Patient advised.KW 

## 2017-05-02 NOTE — Telephone Encounter (Signed)
-----   Message from Carmon Ginsberg, [place] sent at 05/02/2017  7:35 AM EDT ----- Mildly elevated sugar and mildly decreased kidney function. Repeat these labs in 3 months.

## 2017-05-20 ENCOUNTER — Other Ambulatory Visit: Payer: Self-pay | Admitting: Family Medicine

## 2017-05-20 DIAGNOSIS — F439 Reaction to severe stress, unspecified: Secondary | ICD-10-CM

## 2017-06-22 DIAGNOSIS — H401130 Primary open-angle glaucoma, bilateral, stage unspecified: Secondary | ICD-10-CM | POA: Diagnosis not present

## 2017-06-22 DIAGNOSIS — H43819 Vitreous degeneration, unspecified eye: Secondary | ICD-10-CM | POA: Diagnosis not present

## 2017-06-22 DIAGNOSIS — H2513 Age-related nuclear cataract, bilateral: Secondary | ICD-10-CM | POA: Diagnosis not present

## 2017-09-04 ENCOUNTER — Telehealth: Payer: Self-pay | Admitting: Family Medicine

## 2017-09-04 NOTE — Telephone Encounter (Signed)
Phone rings and goes to busy signal, will attempt to contact patient again at a later time. KW

## 2017-09-04 NOTE — Telephone Encounter (Signed)
Okay to advise if its safe to d/c? KW

## 2017-09-04 NOTE — Telephone Encounter (Signed)
Pt states she would like to stop taking the Rx sertraline (ZOLOFT) 50 MG tablet due to waking up with a headache.  Pt states she has been taking a 1/2 tablet for 3 weeks but would like to stop completely.  Please advise.  CB#[id number]/MW

## 2017-09-04 NOTE — Telephone Encounter (Signed)
Go to every other day for 2 weeks then stop.

## 2017-09-05 NOTE — Telephone Encounter (Signed)
Patient has been advised. KW 

## 2017-11-30 ENCOUNTER — Other Ambulatory Visit: Payer: Self-pay | Admitting: Family Medicine

## 2017-11-30 NOTE — Telephone Encounter (Signed)
Please advise 

## 2017-11-30 NOTE — Telephone Encounter (Signed)
Pt stated that she went to [facility] to get her levothyroxine (SYNTHROID, LEVOTHROID) 50 MCG tablet but was advised they didn't have the medication from that manufacturer and [facility] would send [place] something asking to change it. Pt stated she didn't know the manufacturer they were asking to switch. Pt is asking that we contact the pharmacy because she only has 3 pills remaining. Please advise. Thanks TNP

## 2017-11-30 NOTE — Telephone Encounter (Signed)
Have signed approval and sent to be faxed to [facility]

## 2018-02-20 ENCOUNTER — Other Ambulatory Visit: Payer: Self-pay | Admitting: Family Medicine

## 2018-02-28 ENCOUNTER — Other Ambulatory Visit: Payer: Self-pay | Admitting: Family Medicine

## 2018-03-13 NOTE — Progress Notes (Signed)
Patient: Melinda Mack Female    DOB: 1951/07/02   67 y.o.   MRN: [id number] Visit Date: 03/14/2018  Today's Provider: Lelon Huh, MD   Chief Complaint  Patient presents with  . Follow-up  . Hyperlipidemia  . Hypothyroidism   Subjective:    HPI    Lipid/Cholesterol, Follow-up:   Last seen for this 12/06/2016.  Management since that visit includes; labs checked, no changes..  Last Lipid Panel:    Component Value Date/Time   CHOL 182 12/08/2016 0813   TRIG 193 (H) 12/08/2016 0813   HDL 47 12/08/2016 0813   CHOLHDL 3.9 12/08/2016 0813   LDLCALC 96 12/08/2016 0813    She reports good compliance with treatment. She is not having side effects. none  Wt Readings from Last 3 Encounters:  03/14/18 179 lb (81.2 kg)  05/01/17 177 lb 6.6 oz (80.5 kg)  04/24/17 178 lb (80.7 kg)    -----------------------------------------------------------------  Pre-diabetes From 12/06/2016-Stable. Continue low glycemic index diet. HgB A1C 6.1.  Adult hypothyroidism From 12/06/2016-labs checked, no changes.  Lab Results  Component Value Date   TSH 2.350 12/08/2016     Situational stress From 05/01/2017-seen by Mariel Sleet. Started sertraline (ZOLOFT) 50 MG tablet. Since last office visit patient was weaned off sertraline due to it possibly causing headaches. She was having occasional episodes of shortness of breath and thought to be due to anxiety. She states this did improve on sertraline, but she doesn't feel  Like she needs any medication for it right night. She occasionally feels a little depressed and is working full time which is a little stressful, but doesn't feel it is causing any significant problems.   She does have occasional nose bleeds on left nostril. Is on daily ASA and wondering if she needs to continue it. No sinus pain or allergy problems.    Allergies  Allergen Reactions  . Codeine Hives  . Penicillins Swelling  . Sulfa Antibiotics     Other  reaction(s): Skin Rashes  . Tetracycline Hives     Current Outpatient Medications:  .  Aspirin 81 MG EC tablet, 1 tablet daily., Disp: , Rfl:  .  levothyroxine (SYNTHROID, LEVOTHROID) 50 MCG tablet, TAKE 1 TABLET BY MOUTH ONCE DAILY, Disp: 30 tablet, Rfl: 0 .  lovastatin (MEVACOR) 40 MG tablet, TAKE 1 TABLET BY MOUTH AT BEDTIME, Disp: 90 tablet, Rfl: 1 .  Multiple Vitamin (MULTIVITAMIN) capsule, Take 1 capsule by mouth daily., Disp: , Rfl:  .  sertraline (ZOLOFT) 50 MG tablet, Take 1 tablet (50 mg total) by mouth daily. (Patient not taking: Reported on 03/14/2018), Disp: 90 tablet, Rfl: 1  Review of Systems  Constitutional: Negative for appetite change, chills, fatigue and fever.  Respiratory: Negative for chest tightness and shortness of breath.   Cardiovascular: Negative for chest pain and palpitations.  Gastrointestinal: Negative for abdominal pain, nausea and vomiting.  Neurological: Negative for dizziness and weakness.    Social History   Tobacco Use  . Smoking status: Former Smoker    Packs/day: 0.50    Years: 9.00    Pack years: 4.50    Types: Cigarettes    Last attempt to quit: 04/26/1992    Years since quitting: 25.8  . Smokeless tobacco: Never Used  . Tobacco comment: QUIT IN 1996  Substance Use Topics  . Alcohol use: No    Alcohol/week: 0.0 oz   Objective:   BP 110/80 (BP Location: Right Arm, Patient Position: Sitting, Cuff  Size: Large)   Pulse 71   Temp 98.2 F (36.8 C) (Oral)   Resp 16   Wt 179 lb (81.2 kg)   SpO2 94%   BMI 29.79 kg/m  Vitals:   03/14/18 0814  BP: 110/80  Pulse: 71  Resp: 16  Temp: 98.2 F (36.8 C)  TempSrc: Oral  SpO2: 94%  Weight: 179 lb (81.2 kg)     Physical Exam  General Appearance:    Alert, cooperative, no distress  HENT:   bilateral TM normal without fluid or infection, neck without nodes and throat normal without erythema or exudate  Eyes:    PERRL, conjunctiva/corneas clear, EOM's intact       Lungs:     Clear to  auscultation bilaterally, respirations unlabored  Heart:    Regular rate and rhythm  Neurologic:   Awake, alert, oriented x 3. No apparent focal neurological           defect.           Assessment & Plan:     1. Pre-diabetes  - Hemoglobin A1c  2. Adult hypothyroidism  - TSH  3. Hypercholesterolemia without hypertriglyceridemia She is tolerating lovastatin well with no adverse effects.   She does get occasional pain in right anterior upper leg that wakes her at night. Suspect muscle spasm, will check CK to rule out myopathy.  - Comprehensive metabolic panel - Lipid panel  4. Colon cancer screening  - CBC - Cologuard  5. History of endometrial cancer She is unsure what she needs for surveillance. Will check her records from [facility] and let her know if she is due for any particular follow up.        Lelon Huh, MD  [facility]

## 2018-03-14 ENCOUNTER — Encounter: Payer: Self-pay | Admitting: Family Medicine

## 2018-03-14 ENCOUNTER — Ambulatory Visit (INDEPENDENT_AMBULATORY_CARE_PROVIDER_SITE_OTHER): Payer: PPO | Admitting: Family Medicine

## 2018-03-14 VITALS — BP 110/80 | HR 71 | Temp 98.2°F | Resp 16 | Wt 179.0 lb

## 2018-03-14 DIAGNOSIS — Z1211 Encounter for screening for malignant neoplasm of colon: Secondary | ICD-10-CM

## 2018-03-14 DIAGNOSIS — R7303 Prediabetes: Secondary | ICD-10-CM | POA: Diagnosis not present

## 2018-03-14 DIAGNOSIS — Z8542 Personal history of malignant neoplasm of other parts of uterus: Secondary | ICD-10-CM | POA: Diagnosis not present

## 2018-03-14 DIAGNOSIS — E7800 Pure hypercholesterolemia, unspecified: Secondary | ICD-10-CM | POA: Diagnosis not present

## 2018-03-14 DIAGNOSIS — E039 Hypothyroidism, unspecified: Secondary | ICD-10-CM

## 2018-03-15 ENCOUNTER — Telehealth: Payer: Self-pay | Admitting: *Deleted

## 2018-03-15 DIAGNOSIS — Z8542 Personal history of malignant neoplasm of other parts of uterus: Secondary | ICD-10-CM

## 2018-03-15 LAB — CK: Total CK: 109 U/L (ref 24–173)

## 2018-03-15 LAB — COMPREHENSIVE METABOLIC PANEL
A/G RATIO: 1.8 (ref 1.2–2.2)
ALBUMIN: 4.3 g/dL (ref 3.6–4.8)
ALK PHOS: 74 IU/L (ref 39–117)
ALT: 21 IU/L (ref 0–32)
AST: 19 IU/L (ref 0–40)
BILIRUBIN TOTAL: 0.3 mg/dL (ref 0.0–1.2)
BUN / CREAT RATIO: 16 (ref 12–28)
BUN: 16 mg/dL (ref 8–27)
CHLORIDE: 107 mmol/L — AB (ref 96–106)
CO2: 23 mmol/L (ref 20–29)
Calcium: 9.4 mg/dL (ref 8.7–10.3)
Creatinine, Ser: 0.99 mg/dL (ref 0.57–1.00)
GFR calc Af Amer: 69 mL/min/{1.73_m2} (ref >59)
GFR calc non Af Amer: 60 mL/min/{1.73_m2} (ref >59)
GLOBULIN, TOTAL: 2.4 g/dL (ref 1.5–4.5)
GLUCOSE: 119 mg/dL — AB (ref 65–99)
POTASSIUM: 4.6 mmol/L (ref 3.5–5.2)
SODIUM: 142 mmol/L (ref 134–144)
TOTAL PROTEIN: 6.7 g/dL (ref 6.0–8.5)

## 2018-03-15 LAB — LIPID PANEL
CHOLESTEROL TOTAL: 178 mg/dL (ref 100–199)
Chol/HDL Ratio: 3.9 ratio (ref 0.0–4.4)
HDL: 46 mg/dL (ref >39)
LDL Calculated: 99 mg/dL (ref 0–99)
Triglycerides: 163 mg/dL — ABNORMAL HIGH (ref 0–149)
VLDL CHOLESTEROL CAL: 33 mg/dL (ref 5–40)

## 2018-03-15 LAB — CBC
HEMATOCRIT: 39.9 % (ref 34.0–46.6)
Hemoglobin: 12.9 g/dL (ref 11.1–15.9)
MCH: 29 pg (ref 26.6–33.0)
MCHC: 32.3 g/dL (ref 31.5–35.7)
MCV: 90 fL (ref 79–97)
PLATELETS: 269 10*3/uL (ref 150–450)
RBC: 4.45 x10E6/uL (ref 3.77–5.28)
RDW: 14.1 % (ref 12.3–15.4)
WBC: 5.5 10*3/uL (ref 3.4–10.8)

## 2018-03-15 LAB — HEMOGLOBIN A1C
ESTIMATED AVERAGE GLUCOSE: 123 mg/dL
Hgb A1c MFr Bld: 5.9 % — ABNORMAL HIGH (ref 4.8–5.6)

## 2018-03-15 LAB — TSH: TSH: 1.93 u[IU]/mL (ref 0.450–4.500)

## 2018-03-15 NOTE — Telephone Encounter (Signed)
Patient was notified of results. Expressed understanding. Patient would like to see a local gyn. Referral to gyn in epic.

## 2018-03-15 NOTE — Telephone Encounter (Signed)
Please schedule referral to [facility]. Thanks!

## 2018-03-15 NOTE — Telephone Encounter (Signed)
-----   Message from Birdie Sons, MD sent at 03/15/2018  7:52 AM EDT ----- Melinda Mack is slightly elevated at 5.9, average blood sugar is 123 which is a little better than last year. Cholesterol is good at 178, rest of labs are good. Continue current medications.   Also, please advise patient I checked gyn records regarding her history of uterine cancer. She does need follow up with gyn for surveillance exam. But we can refer to local gyn if she doesn't want to go back to [place].  Also, I forgot to advise her yesterday that she could cut down on aspirin to every other day due to her nose bleeds. If they continue after a few more weeks then she should get referral to ENT to look at her nasal pharynx.

## 2018-03-21 ENCOUNTER — Telehealth: Payer: Self-pay | Admitting: Obstetrics & Gynecology

## 2018-03-21 ENCOUNTER — Telehealth: Payer: Self-pay | Admitting: Family Medicine

## 2018-03-21 NOTE — Telephone Encounter (Signed)
Order for cologuard faxed to [facility] °

## 2018-03-21 NOTE — Telephone Encounter (Signed)
[facility] referring for History of endometrial cancer. Called and left voicemail for patient to call back.Marland KitchenMarland Kitchen

## 2018-03-22 NOTE — Telephone Encounter (Signed)
Patient is schedule 05/13/18 with SDJ

## 2018-03-26 DIAGNOSIS — Z1211 Encounter for screening for malignant neoplasm of colon: Secondary | ICD-10-CM | POA: Diagnosis not present

## 2018-03-27 LAB — COLOGUARD: Cologuard: NEGATIVE

## 2018-03-30 ENCOUNTER — Other Ambulatory Visit: Payer: Self-pay | Admitting: Family Medicine

## 2018-04-12 ENCOUNTER — Telehealth: Payer: Self-pay | Admitting: *Deleted

## 2018-04-12 NOTE — Telephone Encounter (Signed)
-----   Message from Birdie Sons, MD sent at 04/12/2018 12:08 PM EDT ----- Cologuard is negative. Check q3 years.

## 2018-04-12 NOTE — Telephone Encounter (Signed)
LMOVM for pt to return call 

## 2018-04-15 NOTE — Telephone Encounter (Signed)
Patient was notified of results. Expressed understanding.  

## 2018-05-13 ENCOUNTER — Encounter: Payer: PPO | Admitting: Obstetrics and Gynecology

## 2018-05-24 ENCOUNTER — Ambulatory Visit (INDEPENDENT_AMBULATORY_CARE_PROVIDER_SITE_OTHER): Payer: PPO

## 2018-05-24 VITALS — BP 124/70 | HR 62 | Temp 99.1°F | Ht 65.0 in | Wt 177.8 lb

## 2018-05-24 DIAGNOSIS — Z0000 Encounter for general adult medical examination without abnormal findings: Secondary | ICD-10-CM

## 2018-05-24 NOTE — Patient Instructions (Signed)
Melinda Mack , Thank you for taking time to come for your Medicare Wellness Visit. I appreciate your ongoing commitment to your health goals. Please review the following plan we discussed and let me know if I can assist you in the future.   Screening recommendations/referrals: Colonoscopy: Up to date Mammogram: Up to date Bone Density: Up to date Recommended yearly ophthalmology/optometry visit for glaucoma screening and checkup Recommended yearly dental visit for hygiene and checkup  Vaccinations: Influenza vaccine: Up to date Pneumococcal vaccine: Prevnar 13 up to date, declined Pneumovax 23 today. Tdap vaccine: Up to date Shingles vaccine: Pt declines today.     Advanced directives: Advance directive discussed with you today. I have provided a copy for you to complete at home and have notarized. Once this is complete please bring a copy in to our office so we can scan it into your chart.  Conditions/risks identified: Recommend cutting out all desserts and junk food and substituting for fruits.   Next appointment: 06/27/18 @ 9 AM with Dr Caryn Section.    Preventive Care 67 Years and Older, Female Preventive care refers to lifestyle choices and visits with your health care provider that can promote health and wellness. What does preventive care include?  A yearly physical exam. This is also called an annual well check.  Dental exams once or twice a year.  Routine eye exams. Ask your health care provider how often you should have your eyes checked.  Personal lifestyle choices, including:  Daily care of your teeth and gums.  Regular physical activity.  Eating a healthy diet.  Avoiding tobacco and drug use.  Limiting alcohol use.  Practicing safe sex.  Taking low-dose aspirin every day.  Taking vitamin and mineral supplements as recommended by your health care provider. What happens during an annual well check? The services and screenings done by your health care provider  during your annual well check will depend on your age, overall health, lifestyle risk factors, and family history of disease. Counseling  Your health care provider may ask you questions about your:  Alcohol use.  Tobacco use.  Drug use.  Emotional well-being.  Home and relationship well-being.  Sexual activity.  Eating habits.  History of falls.  Memory and ability to understand (cognition).  Work and work Statistician.  Reproductive health. Screening  You may have the following tests or measurements:  Height, weight, and BMI.  Blood pressure.  Lipid and cholesterol levels. These may be checked every 5 years, or more frequently if you are over 58 years old.  Skin check.  Lung cancer screening. You may have this screening every year starting at age 33 if you have a 30-pack-year history of smoking and currently smoke or have quit within the past 15 years.  Fecal occult blood test (FOBT) of the stool. You may have this test every year starting at age 19.  Flexible sigmoidoscopy or colonoscopy. You may have a sigmoidoscopy every 5 years or a colonoscopy every 10 years starting at age 21.  Hepatitis C blood test.  Hepatitis B blood test.  Sexually transmitted disease (STD) testing.  Diabetes screening. This is done by checking your blood sugar (glucose) after you have not eaten for a while (fasting). You may have this done every 1-3 years.  Bone density scan. This is done to screen for osteoporosis. You may have this done starting at age 64.  Mammogram. This may be done every 1-2 years. Talk to your health care provider about how often you  should have regular mammograms. Talk with your health care provider about your test results, treatment options, and if necessary, the need for more tests. Vaccines  Your health care provider may recommend certain vaccines, such as:  Influenza vaccine. This is recommended every year.  Tetanus, diphtheria, and acellular pertussis  (Tdap, Td) vaccine. You may need a Td booster every 10 years.  Zoster vaccine. You may need this after age 82.  Pneumococcal 13-valent conjugate (PCV13) vaccine. One dose is recommended after age 58.  Pneumococcal polysaccharide (PPSV23) vaccine. One dose is recommended after age 65. Talk to your health care provider about which screenings and vaccines you need and how often you need them. This information is not intended to replace advice given to you by your health care provider. Make sure you discuss any questions you have with your health care provider. Document Released: 11/05/2015 Document Revised: 06/28/2016 Document Reviewed: 08/10/2015 Elsevier Interactive Patient Education  2017 [place] Prevention in the Home Falls can cause injuries. They can happen to people of all ages. There are many things you can do to make your home safe and to help prevent falls. What can I do on the outside of my home?  Regularly fix the edges of walkways and driveways and fix any cracks.  Remove anything that might make you trip as you walk through a door, such as a raised step or threshold.  Trim any bushes or trees on the path to your home.  Use bright outdoor lighting.  Clear any walking paths of anything that might make someone trip, such as rocks or tools.  Regularly check to see if handrails are loose or broken. Make sure that both sides of any steps have handrails.  Any raised decks and porches should have guardrails on the edges.  Have any leaves, snow, or ice cleared regularly.  Use sand or salt on walking paths during winter.  Clean up any spills in your garage right away. This includes oil or grease spills. What can I do in the bathroom?  Use night lights.  Install grab bars by the toilet and in the tub and shower. Do not use towel bars as grab bars.  Use non-skid mats or decals in the tub or shower.  If you need to sit down in the shower, use a plastic, non-slip  stool.  Keep the floor dry. Clean up any water that spills on the floor as soon as it happens.  Remove soap buildup in the tub or shower regularly.  Attach bath mats securely with double-sided non-slip rug tape.  Do not have throw rugs and other things on the floor that can make you trip. What can I do in the bedroom?  Use night lights.  Make sure that you have a light by your bed that is easy to reach.  Do not use any sheets or blankets that are too big for your bed. They should not hang down onto the floor.  Have a firm chair that has side arms. You can use this for support while you get dressed.  Do not have throw rugs and other things on the floor that can make you trip. What can I do in the kitchen?  Clean up any spills right away.  Avoid walking on wet floors.  Keep items that you use a lot in easy-to-reach places.  If you need to reach something above you, use a strong step stool that has a grab bar.  Keep electrical cords out  of the way.  Do not use floor polish or wax that makes floors slippery. If you must use wax, use non-skid floor wax.  Do not have throw rugs and other things on the floor that can make you trip. What can I do with my stairs?  Do not leave any items on the stairs.  Make sure that there are handrails on both sides of the stairs and use them. Fix handrails that are broken or loose. Make sure that handrails are as long as the stairways.  Check any carpeting to make sure that it is firmly attached to the stairs. Fix any carpet that is loose or worn.  Avoid having throw rugs at the top or bottom of the stairs. If you do have throw rugs, attach them to the floor with carpet tape.  Make sure that you have a light switch at the top of the stairs and the bottom of the stairs. If you do not have them, ask someone to add them for you. What else can I do to help prevent falls?  Wear shoes that:  Do not have high heels.  Have rubber bottoms.  Are  comfortable and fit you well.  Are closed at the toe. Do not wear sandals.  If you use a stepladder:  Make sure that it is fully opened. Do not climb a closed stepladder.  Make sure that both sides of the stepladder are locked into place.  Ask someone to hold it for you, if possible.  Clearly mark and make sure that you can see:  Any grab bars or handrails.  First and last steps.  Where the edge of each step is.  Use tools that help you move around (mobility aids) if they are needed. These include:  Canes.  Walkers.  Scooters.  Crutches.  Turn on the lights when you go into a dark area. Replace any light bulbs as soon as they burn out.  Set up your furniture so you have a clear path. Avoid moving your furniture around.  If any of your floors are uneven, fix them.  If there are any pets around you, be aware of where they are.  Review your medicines with your doctor. Some medicines can make you feel dizzy. This can increase your chance of falling. Ask your doctor what other things that you can do to help prevent falls. This information is not intended to replace advice given to you by your health care provider. Make sure you discuss any questions you have with your health care provider. Document Released: 08/05/2009 Document Revised: 03/16/2016 Document Reviewed: 11/13/2014 Elsevier Interactive Patient Education  2017 [employer].

## 2018-05-24 NOTE — Progress Notes (Signed)
Subjective:   Melinda Mack is a 67 y.o. female who presents for Medicare Annual (Subsequent) preventive examination.  Review of Systems:  N/A  Cardiac Risk Factors include: advanced age (>79men, >66 women);dyslipidemia     Objective:     Vitals: BP 124/70 (BP Location: Right Arm)   Pulse 62   Temp 99.1 F (37.3 C) (Oral)   Ht 5\' 5"  (1.651 m)   Wt 177 lb 12.8 oz (80.6 kg)   BMI 29.59 kg/m   Body mass index is 29.59 kg/m.  Advanced Directives 05/24/2018 04/24/2017 07/05/2015  Does Patient Have a Medical Advance Directive? No No Yes  Copy of [place] in Chart? - - No - copy requested  Would patient like information on creating a medical advance directive? Yes (MAU/Ambulatory/Procedural Areas - Information given) Yes (ED - Information included in AVS) -    Tobacco Social History   Tobacco Use  Smoking Status Former Smoker  . Packs/day: 0.50  . Years: 9.00  . Pack years: 4.50  . Types: Cigarettes  . Last attempt to quit: 04/26/1992  . Years since quitting: 26.0  Smokeless Tobacco Never Used  Tobacco Comment   QUIT IN 1996     Counseling given: Not Answered Comment: QUIT IN 1996   Clinical Intake:  Pre-visit preparation completed: Yes  Pain : No/denies pain Pain Score: 0-No pain     Nutritional Status: BMI 25 -29 Overweight Nutritional Risks: None Diabetes: No  How often do you need to have someone help you when you read instructions, pamphlets, or other written materials from your doctor or pharmacy?: 1 - Never  Interpreter Needed?: No  Information entered by :: [facility], LPN  Past Medical History:  Diagnosis Date  . Cancer ([place]) 03/2012   uterine  . History of chicken pox   . History of measles   . History of mumps   . Hypercholesterolemia   . Hypothyroidism 2013   Past Surgical History:  Procedure Laterality Date  . ABDOMINAL HYSTERECTOMY  04/18/2012   cancer at [facility]  . BILATERAL SALPINGOOPHORECTOMY  6/20123  .  BREAST BIOPSY Left 05/08/2016   neg- [facility]  . BREAST BIOPSY Right 10/2015   neg- [facility]  . LYMPHADENECTOMY  03/2012  . 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 TONSILLECTOMY AND ADENOIDECTOMY  1970  . TUBAL LIGATION  1978   Family History  Problem Relation Age of Onset  . Lung disease Mother   . COPD Mother   . Hypertension Mother   . Heart disease Mother   . Lung disease Father   . Healthy Sister   . Healthy Sister   . Heart attack Maternal Grandmother   . Alzheimer's disease Paternal Grandmother   . Coronary artery disease Other   . Hypertension Maternal Uncle   . Breast cancer Paternal Aunt 48  . Lung cancer Maternal Uncle 71   Social History   Socioeconomic History  . Marital status: Widowed    Spouse name: Not on file  . Number of children: 3  . Years of education: 4  . Highest education level: 12th grade  Occupational History    Comment: [employer]  Social Needs  . Financial resource strain: Not hard at all  . Food insecurity:    Worry: Never true    Inability: Never true  . Transportation needs:    Medical: No    Non-medical: No  Tobacco Use  . Smoking status: Former Smoker  Packs/day: 0.50    Years: 9.00    Pack years: 4.50    Types: Cigarettes    Last attempt to quit: 04/26/1992    Years since quitting: 26.0  . Smokeless tobacco: Never Used  . Tobacco comment: QUIT IN 1996  Substance and Sexual Activity  . Alcohol use: No    Alcohol/week: 0.0 oz  . Drug use: No  . Sexual activity: Not on file  Lifestyle  . Physical activity:    Days per week: Not on file    Minutes per session: Not on file  . Stress: Rather much  Relationships  . Social connections:    Talks on phone: Not on file    Gets together: Not on file    Attends religious service: Not on file    Active member of club or organization: Not on file    Attends meetings of clubs or organizations: Not on file    Relationship status: Not on file  Other Topics Concern  .  Not on file  Social History Narrative   Widowed   Caffeine use- coffee - 2 cups daily    Outpatient Encounter Medications as of 05/24/2018  Medication Sig  . Aspirin 81 MG EC tablet 1 tablet every other day.   . levothyroxine (SYNTHROID, LEVOTHROID) 50 MCG tablet Take 1 tablet (50 mcg total) by mouth daily.  Marland Kitchen lovastatin (MEVACOR) 40 MG tablet TAKE 1 TABLET BY MOUTH AT BEDTIME  . Multiple Vitamin (MULTIVITAMIN) capsule Take 1 capsule by mouth daily.   No facility-administered encounter medications on file as of 05/24/2018.     Activities of Daily Living In your present state of health, do you have any difficulty performing the following activities: 05/24/2018  Hearing? N  Vision? N  Difficulty concentrating or making decisions? N  Walking or climbing stairs? N  Dressing or bathing? N  Doing errands, shopping? N  Preparing Food and eating ? N  Using the Toilet? N  In the past six months, have you accidently leaked urine? N  Do you have problems with loss of bowel control? N  Managing your Medications? N  Managing your Finances? N  Housekeeping or managing your Housekeeping? N  Some recent data might be hidden    Patient Care Team: Birdie Sons, MD as PCP - General (Family Medicine) Anell Barr, [place] as Consulting Physician (Optometry) Carmon Ginsberg, [place] as Referring Physician (Family Medicine) Will Bonnet, MD as Attending Physician (Obstetrics and Gynecology)    Assessment:   This is a routine wellness examination for Melinda Mack.  Exercise Activities and Dietary recommendations Current Exercise Habits: The patient does not participate in regular exercise at present, Exercise limited by: None identified  Goals    . DIET - REDUCE SUGAR INTAKE     Recommend cutting out all desserts and junk food and substituting for fruits.        Fall Risk Fall Risk  05/24/2018 04/24/2017 04/17/2017 07/05/2015  Falls in the past year? No No No No   Is the patient's home free of  loose throw rugs in walkways, pet beds, electrical cords, etc?   yes      Grab bars in the bathroom? no      Handrails on the stairs?   no      Adequate lighting?   yes  Timed Get Up and Go performed: N/A  Depression Screen PHQ 2/9 Scores 05/24/2018 03/14/2018 04/24/2017 04/17/2017  PHQ - 2 Score 1 1 2  1  PHQ- 9 Score - - 8 -     Cognitive Function: Pt declined screening today.      6CIT Screen 04/24/2017  What Year? 0 points  What month? 0 points  What time? 0 points  Count back from 20 0 points  Months in reverse 0 points  Repeat phrase 0 points  Total Score 0    Immunization History  Administered Date(s) Administered  . Influenza,inj,Quad PF,6+ Mos 06/16/2015, 08/01/2016  . Influenza-Unspecified 08/18/2017  . Pneumococcal Conjugate-13 12/06/2016  . Tdap 01/21/2009  . Zoster 08/28/2011    Qualifies for Shingles Vaccine? Due for Shingles vaccine. Declined my offer to administer today. Education has been provided regarding the importance of this vaccine. Pt has been advised to call her insurance company to determine her out of pocket expense. Advised she may also receive this vaccine at her local pharmacy or Health Dept. Verbalized acceptance and understanding.  Screening Tests Health Maintenance  Topic Date Due  . PNA vac Low Risk Adult (2 of 2 - PPSV23) 12/06/2017  . INFLUENZA VACCINE  05/23/2018  . MAMMOGRAM  11/17/2018  . TETANUS/TDAP  01/22/2019  . DEXA SCAN  01/31/2020  . Fecal DNA (Cologuard)  03/27/2021  . Hepatitis C Screening  Completed    Cancer Screenings: Lung: Low Dose CT Chest recommended if Age 36-80 years, 30 pack-year currently smoking OR have quit w/in 15years. Patient does not qualify. Breast:  Up to date on Mammogram? Yes   Up to date of Bone Density/Dexa? Yes Colorectal: Up to date  Additional Screenings:  Hepatitis C Screening: Up to date     Plan:  I have personally reviewed and addressed the Medicare Annual Wellness questionnaire and have  noted the following in the patient's chart:  A. Medical and social history B. Use of alcohol, tobacco or illicit drugs  C. Current medications and supplements D. Functional ability and status E.  Nutritional status F.  Physical activity G. Advance directives H. List of other physicians I.  Hospitalizations, surgeries, and ER visits in previous 12 months J.  [place] such as hearing and vision if needed, cognitive and depression L. Referrals and appointments - none  In addition, I have reviewed and discussed with patient certain preventive protocols, quality metrics, and best practice recommendations. A written personalized care plan for preventive services as well as general preventive health recommendations were provided to patient.  See attached scanned questionnaire for additional information.   Signed,  Fabio Neighbors, LPN Nurse Health Advisor   Nurse Recommendations: Pt declined the Pneumovax 23 vaccine today and would like to wait until CPE to receive vaccine.

## 2018-06-18 ENCOUNTER — Encounter: Payer: Self-pay | Admitting: Obstetrics and Gynecology

## 2018-06-18 ENCOUNTER — Encounter: Payer: PPO | Admitting: Obstetrics and Gynecology

## 2018-06-18 NOTE — Progress Notes (Signed)
Obstetrics & Gynecology Office Visit   Chief Complaint  Patient presents with  . Gynecologic Exam    pat here for a pap,per pt last pap was 2-3 years ago in [place]; [place], has a hx of abnormal paps   History of Present Illness: 67 y.o. G76P3003 female  She denies headaches, cough, weight changes (she continues to intentionally lose weight), bloating, constipation, early satiety, and vaginal bleeding.   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  04/08/2012 Surgery TRH,BSO,PPLND. Findings: normal anatomy Path:grade 1, 25% invasion, negative nodes,No LVSI, cervical or adenexal involvement.  08/09/2013 No evidence of disease normal exam  04/21/2014 No evidence of disease Normal exam  11/03/2014 No evidence of disease normal exam   Last seen by Dr. Marlaine Hind 05/2015   Past Medical History:  Diagnosis Date  . Cancer ([place]) 03/2012   uterine  . History of chicken pox   . History of measles   . History of mumps   . Hypercholesterolemia   . Hypothyroidism 2013    Past Surgical History:  Procedure Laterality Date  . ABDOMINAL HYSTERECTOMY  04/18/2012   cancer at [facility]  . BILATERAL SALPINGOOPHORECTOMY  6/20123  . BREAST BIOPSY Left 05/08/2016   neg- [facility]  . BREAST BIOPSY Right 10/2015   neg- [facility]  . LYMPHADENECTOMY  03/2012  . 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 TONSILLECTOMY AND ADENOIDECTOMY  1970  . TUBAL LIGATION  1978    Gynecologic History: No LMP recorded. Patient is postmenopausal.  Obstetric History: [id number]  Family History  Problem Relation Age of Onset  . Lung disease Mother   . COPD Mother   . Hypertension Mother   . Heart disease Mother   . Lung disease Father   . Healthy Sister   . Healthy Sister   . Heart attack Maternal Grandmother   . Alzheimer's disease Paternal Grandmother   . Coronary artery disease Other   . Hypertension Maternal Uncle   . Breast  cancer Paternal Aunt 53  . Lung cancer Maternal Uncle 71    Social History   Socioeconomic History  . Marital status: Widowed    Spouse name: Not on file  . Number of children: 3  . Years of education: 66  . Highest education level: 12th grade  Occupational History    Comment: [employer] fast [employer]er  Social Needs  . Financial resource strain: Not hard at all  . Food insecurity:    Worry: Never true    Inability: Never true  . Transportation needs:    Medical: No    Non-medical: No  Tobacco Use  . Smoking status: Former Smoker    Packs/day: 0.50    Years: 9.00    Pack years: 4.50    Types: Cigarettes    Last attempt to quit: 04/26/1992    Years since quitting: 26.1  . Smokeless tobacco: Never Used  . Tobacco comment: QUIT IN 1996  Substance and Sexual Activity  . Alcohol use: No    Alcohol/week: 0.0 standard drinks  . Drug use: No  . Sexual activity: Not on file  Lifestyle  . Physical activity:    Days per week: Not on file    Minutes per session: Not on file  . Stress: Rather much  Relationships  . Social connections:    Talks on phone: Not on file    Gets together: Not on file  Attends religious service: Not on file    Active member of club or organization: Not on file    Attends meetings of clubs or organizations: Not on file    Relationship status: Not on file  . Intimate partner violence:    Fear of current or ex partner: Not on file    Emotionally abused: Not on file    Physically abused: Not on file    Forced sexual activity: Not on file  Other Topics Concern  . Not on file  Social History Narrative   Widowed   Caffeine use- coffee - 2 cups daily    Allergies  Allergen Reactions  . Codeine Hives  . Penicillins Swelling  . Tetracycline Hives  . Sulfa Antibiotics     Other reaction(s): Skin Rashes    Prior to Admission medications   Medication Sig Start Date End Date Taking? Authorizing Provider  Aspirin 81 MG EC tablet 1 tablet every other  day.  05/10/09  Yes [provider]  levothyroxine (SYNTHROID, LEVOTHROID) 50 MCG tablet Take 1 tablet (50 mcg total) by mouth daily. 03/31/18  Yes Birdie Sons, MD  lovastatin (MEVACOR) 40 MG tablet TAKE 1 TABLET BY MOUTH AT BEDTIME 02/21/18  Yes Birdie Sons, MD  Multiple Vitamin (MULTIVITAMIN) capsule Take 1 capsule by mouth daily.   Yes [provider]    ROS   Physical Exam BP 106/74   Pulse 69   Ht 5\' 5"  (1.651 m)   Wt 177 lb (80.3 kg)   SpO2 97%   BMI 29.45 kg/m  No LMP recorded. Patient is postmenopausal. OBGyn Exam  Female chaperone present for pelvic and breast  portions of the physical exam  Assessment: 67 y.o. G73P3003 female here for No diagnosis found.   Plan: Problem List Items Addressed This Visit    None      Prentice Docker, MD 06/18/2018 10:07 AM

## 2018-06-25 ENCOUNTER — Telehealth: Payer: Self-pay | Admitting: Family Medicine

## 2018-06-25 NOTE — Telephone Encounter (Signed)
Pt cancelled appointment at [facility] for  History of endometrial cancer and has not rescheduled

## 2018-06-25 NOTE — Telephone Encounter (Signed)
FYI

## 2018-06-27 ENCOUNTER — Encounter: Payer: PPO | Admitting: Family Medicine

## 2018-07-24 ENCOUNTER — Ambulatory Visit (INDEPENDENT_AMBULATORY_CARE_PROVIDER_SITE_OTHER): Payer: PPO | Admitting: Family Medicine

## 2018-07-24 ENCOUNTER — Encounter: Payer: Self-pay | Admitting: Family Medicine

## 2018-07-24 VITALS — BP 120/77 | HR 71 | Temp 98.4°F | Resp 16 | Ht 65.0 in | Wt 175.0 lb

## 2018-07-24 DIAGNOSIS — Z0000 Encounter for general adult medical examination without abnormal findings: Secondary | ICD-10-CM

## 2018-07-24 DIAGNOSIS — Z23 Encounter for immunization: Secondary | ICD-10-CM | POA: Diagnosis not present

## 2018-07-24 DIAGNOSIS — R040 Epistaxis: Secondary | ICD-10-CM | POA: Diagnosis not present

## 2018-07-24 DIAGNOSIS — K219 Gastro-esophageal reflux disease without esophagitis: Secondary | ICD-10-CM

## 2018-07-24 DIAGNOSIS — M791 Myalgia, unspecified site: Secondary | ICD-10-CM

## 2018-07-24 DIAGNOSIS — R490 Dysphonia: Secondary | ICD-10-CM | POA: Diagnosis not present

## 2018-07-24 DIAGNOSIS — R7303 Prediabetes: Secondary | ICD-10-CM | POA: Diagnosis not present

## 2018-07-24 MED ORDER — OMEPRAZOLE 20 MG PO CPDR
20.0000 mg | DELAYED_RELEASE_CAPSULE | Freq: Every day | ORAL | 3 refills | Status: DC
Start: 2018-07-24 — End: 2019-04-15

## 2018-07-24 NOTE — Progress Notes (Signed)
Patient: Melinda Mack, Female    DOB: [birth year], 67 y.o.   MRN: [id number] Visit Date: 07/24/2018  Today's Provider: Lelon Huh, MD   Chief Complaint  Patient presents with  . Annual Exam  . Hypothyroidism  . Hyperlipidemia   Subjective:   Patient saw [facility]enzie for AWV on 05/24/2018.   Complete Physical Melinda Mack is a 67 y.o. female. She feels fairly well. She reports no regular exercising. She reports she is sleeping fairly well.  -----------------------------------------------------------    Lipid/Cholesterol, Follow-up:   Last seen for this 5 months ago.  Management since that visit includes; labs checked, no changes.  Last Lipid Panel:    Component Value Date/Time   CHOL 178 03/14/2018 1110   TRIG 163 (H) 03/14/2018 1110   HDL 46 03/14/2018 1110   CHOLHDL 3.9 03/14/2018 1110   LDLCALC 99 03/14/2018 1110    She reports good compliance with treatment. She is not having side effects.   Wt Readings from Last 3 Encounters:  07/24/18 175 lb (79.4 kg)  06/18/18 177 lb (80.3 kg)  05/24/18 177 lb 12.8 oz (80.6 kg)    ------------------------------------------------------------------------  Pre-diabetes From 03/14/2018-no changes. Hemoglobin A1c 5.9. Patient reports that she has not been watching her diet, or exercising regularly.    Adult hypothyroidism From 03/14/2018-labs checked, no changes. Patient reports good compliance with treatment.  Lab Results  Component Value Date   TSH 1.930 03/14/2018     History of endometrial cancer From 03/14/2018-referred back to GYN for surveillance. Patient had to cancel gyn follow up due to an emergency, but is process of rescheduling.    She also reports that she stays hoarse all the time. She has heartburn a couple of times a week, and more if she is not careful with her diet. Does not take any medications to suppress reflux symptoms.   She also reports her muscles her legs and lower back  get stiff and painful when she goes to stand and walk after sitting for prolonged periods. No joint pains or swelling. Sx improve after moving around for awhile. States she feels like an old lady.   Review of Systems  Constitutional: Negative for chills, fatigue and fever.  HENT: Positive for nosebleeds and voice change. Negative for congestion, ear pain, rhinorrhea, sneezing and sore throat.   Eyes: Negative.  Negative for pain and redness.  Respiratory: Negative for cough, shortness of breath and wheezing.   Cardiovascular: Negative for chest pain and leg swelling.  Gastrointestinal: Positive for constipation and diarrhea (started this morning). Negative for abdominal pain, blood in stool and nausea.  Endocrine: Negative for polydipsia and polyphagia.  Genitourinary: Negative.  Negative for dysuria, flank pain, hematuria, pelvic pain, vaginal bleeding and vaginal discharge.  Musculoskeletal: Negative for arthralgias, back pain, gait problem and joint swelling.  Skin: Negative for rash.  Neurological: Positive for light-headedness. Negative for dizziness, tremors, seizures, weakness, numbness and headaches.  Hematological: Negative for adenopathy.  Psychiatric/Behavioral: Negative.  Negative for behavioral problems, confusion and dysphoric mood. The patient is not nervous/anxious and is not hyperactive.     Social History   Socioeconomic History  . Marital status: Widowed    Spouse name: Not on file  . Number of children: 3  . Years of education: 42  . Highest education level: 12th grade  Occupational History    Comment: [employer]  Social Needs  . Financial resource strain: Not hard at all  . Food insecurity:  Worry: Never true    Inability: Never true  . Transportation needs:    Medical: No    Non-medical: No  Tobacco Use  . Smoking status: Former Smoker    Packs/day: 0.50    Years: 9.00    Pack years: 4.50    Types: Cigarettes    Last attempt to quit:  04/26/1992    Years since quitting: 26.2  . Smokeless tobacco: Never Used  . Tobacco comment: QUIT IN 1996  Substance and Sexual Activity  . Alcohol use: No    Alcohol/week: 0.0 standard drinks  . Drug use: No  . Sexual activity: Not on file  Lifestyle  . Physical activity:    Days per week: Not on file    Minutes per session: Not on file  . Stress: Rather much  Relationships  . Social connections:    Talks on phone: Not on file    Gets together: Not on file    Attends religious service: Not on file    Active member of club or organization: Not on file    Attends meetings of clubs or organizations: Not on file    Relationship status: Not on file  . Intimate partner violence:    Fear of current or ex partner: Not on file    Emotionally abused: Not on file    Physically abused: Not on file    Forced sexual activity: Not on file  Other Topics Concern  . Not on file  Social History Narrative   Widowed   Caffeine use- coffee - 2 cups daily    Past Medical History:  Diagnosis Date  . Cancer ([place]) 03/2012   uterine  . History of chicken pox   . History of measles   . History of mumps   . Hypercholesterolemia   . Hypothyroidism 2013     Patient Active Problem List   Diagnosis Date Noted  . Osteopenia 01/31/2017  . Abnormal mammogram of right breast 11/10/2015  . Fatigue 06/16/2015  . History of endometrial cancer 06/15/2015  . Heartburn 06/15/2015  . Hot flash, menopausal 06/15/2015  . Adult hypothyroidism 06/15/2015  . Insomnia 06/15/2015  . Seborrheic keratosis 06/15/2015  . Obesity 06/15/2015  . Pre-diabetes 06/15/2015  . Recurrent cystitis 06/15/2015  . Imbalance 06/15/2015  . Malignant neoplasm of body of uterus ([place]) 03/05/2012  . Abnormal C-reactive protein 02/02/2009  . Hypercholesterolemia without hypertriglyceridemia 02/02/2009  . Personal history of methicillin resistant Staphylococcus aureus 06/01/2008  . History of tobacco use 10/23/1994    Past  Surgical History:  Procedure Laterality Date  . ABDOMINAL HYSTERECTOMY  04/18/2012   cancer at [facility]  . BILATERAL SALPINGOOPHORECTOMY  6/20123  . BREAST BIOPSY Left 05/08/2016   neg- Byrnett  . BREAST BIOPSY Right 10/2015   neg- Byrnett  . LYMPHADENECTOMY  03/2012  . 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 TONSILLECTOMY AND ADENOIDECTOMY  1970  . TUBAL LIGATION  1978    Her family history includes Alzheimer's disease in her paternal grandmother; Breast cancer (age of onset: 69) in her paternal aunt; COPD in her mother; Coronary artery disease in her other; Healthy in her sister and sister; Heart attack in her maternal grandmother; Heart disease in her mother; Hypertension in her maternal uncle and mother; Lung cancer (age of onset: 17) in her maternal uncle; Lung disease in her father and mother.      Current Outpatient Medications:  .  Aspirin 81 MG EC tablet, 1 tablet every  other day. , Disp: , Rfl:  .  levothyroxine (SYNTHROID, LEVOTHROID) 50 MCG tablet, Take 1 tablet (50 mcg total) by mouth daily., Disp: 90 tablet, Rfl: 3 .  lovastatin (MEVACOR) 40 MG tablet, TAKE 1 TABLET BY MOUTH AT BEDTIME, Disp: 90 tablet, Rfl: 1 .  Multiple Vitamin (MULTIVITAMIN) capsule, Take 1 capsule by mouth daily., Disp: , Rfl:   Patient Care Team: Birdie Sons, MD as PCP - General (Family Medicine) Anell Barr, OD as Consulting Physician (Optometry) Carmon Ginsberg, [place] as Referring Physician (Family Medicine) Will Bonnet, MD as Attending Physician (Obstetrics and Gynecology)     Objective:   Vitals: BP 120/77 (BP Location: Left Arm, Patient Position: Sitting, Cuff Size: Large)   Pulse 71   Temp 98.4 F (36.9 C) (Oral)   Resp 16   Ht _0  (1.651 m)   Wt 175 lb (79.4 kg)   SpO2 96% Comment: room air  BMI 29.12 kg/m   Physical Exam   General Appearance:    Alert, cooperative, no distress, appears stated age  Head:    Normocephalic, without obvious abnormality, atraumatic    Eyes:    PERRL, conjunctiva/corneas clear, EOM's intact, fundi    benign, both eyes  Ears:    Normal TM's and external ear canals, both ears  Nose:   Nares normal, septum midline, mucosa normal, no drainage    or sinus tenderness  Throat:   Lips, mucosa, and tongue normal; teeth and gums normal  Neck:   Supple, symmetrical, trachea midline, no adenopathy;    thyroid:  no enlargement/tenderness/nodules; no carotid   bruit or JVD  Back:     Symmetric, no curvature, ROM normal, no CVA tenderness  Lungs:     Clear to auscultation bilaterally, respirations unlabored  Chest Wall:    No tenderness or deformity   Heart:    Regular rate and rhythm, S1 and S2 normal, no murmur, rub   or gallop  Breast Exam:    declined today because she didn't want to get undress due to diarrhea  Abdomen:     Soft, non-tender, bowel sounds active all four quadrants,    no masses, no organomegaly  Pelvic:    deferred  Extremities:   Extremities normal, atraumatic, no cyanosis or edema  Pulses:   2+ and symmetric all extremities  Skin:   Skin color, texture, turgor normal, scattered benign SKs on trunk and UEs. No suspicious lesions.   Lymph nodes:   Cervical, supraclavicular, and axillary nodes normal  Neurologic:   CNII-XII intact, normal strength, sensation and reflexes    throughout    Activities of Daily Living In your present state of health, do you have any difficulty performing the following activities: 05/24/2018  Hearing? N  Vision? N  Difficulty concentrating or making decisions? N  Walking or climbing stairs? N  Dressing or bathing? N  Doing errands, shopping? N  Preparing Food and eating ? N  Using the Toilet? N  In the past six months, have you accidently leaked urine? N  Do you have problems with loss of bowel control? N  Managing your Medications? N  Managing your Finances? N  Housekeeping or managing your Housekeeping? N  Some recent data might be hidden    Fall Risk Assessment Fall  Risk  05/24/2018 04/24/2017 04/17/2017 07/05/2015  Falls in the past year? No No No No     Depression Screen PHQ 2/9 Scores 05/24/2018 03/14/2018 04/24/2017 04/17/2017  PHQ - 2  Score _0 PHQ- 9 Score - - 8 -      Assessment & Plan:    Annual Physical Reviewed patient's Family Medical History Reviewed and updated list of patient's medical providers Assessment of cognitive impairment was done Assessed patient's functional ability Established a written schedule for health screening [place] Completed and Reviewed  Exercise Activities and Dietary recommendations Goals    . Cut out extra servings     Recommend cutting out junk food and sweets in daily diet. Pt to focus on protein snacks in between meals.     Marland Kitchen DIET - REDUCE SUGAR INTAKE     Recommend cutting out all desserts and junk food and substituting for fruits.        Immunization History  Administered Date(s) Administered  . Influenza,inj,Quad PF,6+ Mos 06/16/2015, 08/01/2016  . Influenza-Unspecified 08/18/2017  . Pneumococcal Conjugate-13 12/06/2016  . Tdap 01/21/2009  . Zoster 08/28/2011    Health Maintenance  Topic Date Due  . PNA vac Low Risk Adult (2 of 2 - PPSV23) 12/06/2017  . INFLUENZA VACCINE  08/06/2018 (Originally 05/23/2018)  . MAMMOGRAM  11/17/2018  . TETANUS/TDAP  01/22/2019  . DEXA SCAN  01/31/2020  . Fecal DNA (Cologuard)  03/27/2021  . Hepatitis C Screening  Completed     Discussed health benefits of physical activity, and encouraged her to engage in regular exercise appropriate for her age and condition.    ------------------------------------------------------------------------------------------------------------  1. Annual physical exam   2. Hoarseness Likely secondary to GERD. Had ENT exam a few years ago by Dr. Tami Ribas for epistaxis.  - omeprazole (PRILOSEC) 20 MG capsule; Take 1 capsule (20 mg total) by mouth daily.  Dispense: 30 capsule; Refill: 3  3. Need for  influenza vaccination Flu vaccine today.   4. Need for pneumococcal vaccination Pneumovax-23 today.   5. Pre-diabetes  - Hemoglobin A1c  6. Myalgia  - Sed Rate (ESR) - ANA  7. Gastroesophageal reflux disease without esophagitis start- omeprazole (PRILOSEC) 20 MG capsule; Take 1 capsule (20 mg total) by mouth daily.  Dispense: 30 capsule; Refill: 3.   8. Epistaxis Continues to have nosebleeds only on left side several times a month. She reports she did have thorough ENT exam including MRI for this by Dr. Tami Ribas a few years ago which was normal. Episodes are less frequent since she reduced aspirin to QOD.    Lelon Huh, MD  [facility]

## 2018-07-24 NOTE — Patient Instructions (Addendum)
. Please call the [facility] [phone]) to schedule a routine screening mammogram.   The CDC recommends two doses of Shingrix (the shingles vaccine) separated by 2 to 6 months for adults age 67 years and older. I recommend checking with your insurance plan regarding coverage for this vaccine.     Gastroesophageal Reflux Disease, Adult Normally, food travels down the esophagus and stays in the stomach to be digested. If a person has gastroesophageal reflux disease (GERD), food and stomach acid move back up into the esophagus. When this happens, the esophagus becomes sore and swollen (inflamed). Over time, GERD can make small holes (ulcers) in the lining of the esophagus. Follow these instructions at home: Diet  Follow a diet as told by your doctor. You may need to avoid foods and drinks such as: ? Coffee and tea (with or without caffeine). ? Drinks that contain alcohol. ? Energy drinks and sports drinks. ? Carbonated drinks or sodas. ? Chocolate and cocoa. ? Peppermint and mint flavorings. ? Garlic and onions. ? Horseradish. ? Spicy and acidic foods, such as peppers, chili powder, curry powder, vinegar, hot sauces, and BBQ sauce. ? Citrus fruit juices and citrus fruits, such as oranges, lemons, and limes. ? Tomato-based foods, such as red sauce, chili, salsa, and pizza with red sauce. ? Fried and fatty foods, such as donuts, french fries, potato chips, and high-fat dressings. ? High-fat meats, such as hot dogs, rib eye steak, sausage, ham, and bacon. ? High-fat dairy items, such as whole milk, butter, and cream cheese.  Eat small meals often. Avoid eating large meals.  Avoid drinking large amounts of liquid with your meals.  Avoid eating meals during the 2-3 hours before bedtime.  Avoid lying down right after you eat.  Do not exercise right after you eat. General instructions  Pay attention to any changes in your symptoms.  Take over-the-counter and  prescription medicines only as told by your doctor. Do not take aspirin, ibuprofen, or other NSAIDs unless your doctor says it is okay.  Do not use any tobacco products, including cigarettes, chewing tobacco, and e-cigarettes. If you need help quitting, ask your doctor.  Wear loose clothes. Do not wear anything tight around your waist.  Raise (elevate) the head of your bed about 6 inches (15 cm).  Try to lower your stress. If you need help doing this, ask your doctor.  If you are overweight, lose an amount of weight that is healthy for you. Ask your doctor about a safe weight loss goal.  Keep all follow-up visits as told by your doctor. This is important. Contact a doctor if:  You have new symptoms.  You lose weight and you do not know why it is happening.  You have trouble swallowing, or it hurts to swallow.  You have wheezing or a cough that keeps happening.  Your symptoms do not get better with treatment.  You have a hoarse voice. Get help right away if:  You have pain in your arms, neck, jaw, teeth, or back.  You feel sweaty, dizzy, or light-headed.  You have chest pain or shortness of breath.  You throw up (vomit) and your throw up looks like blood or coffee grounds.  You pass out (faint).  Your poop (stool) is bloody or black.  You cannot swallow, drink, or eat. This information is not intended to replace advice given to you by your health care provider. Make sure you discuss any questions you have with your health  care provider. Document Released: 03/27/2008 Document Revised: 03/16/2016 Document Reviewed: 02/03/2015 Elsevier Interactive Patient Education  [employer].

## 2018-07-25 ENCOUNTER — Telehealth: Payer: Self-pay

## 2018-07-25 LAB — HEMOGLOBIN A1C
ESTIMATED AVERAGE GLUCOSE: 128 mg/dL
Hgb A1c MFr Bld: 6.1 % — ABNORMAL HIGH (ref 4.8–5.6)

## 2018-07-25 LAB — SEDIMENTATION RATE: SED RATE: 8 mm/h (ref 0–40)

## 2018-07-25 LAB — ANA: ANA: NEGATIVE

## 2018-07-25 NOTE — Telephone Encounter (Signed)
-----   Message from Birdie Sons, MD sent at 07/25/2018 12:50 PM EDT ----- a1c is good at 6.1 indicating average blood sugar is 128. Rest of labs are normal. No sign of any abnormal muscle or joint inflammation.

## 2018-07-25 NOTE — Telephone Encounter (Signed)
Left message advising pt.  (Per DPR)  Thanks,   -Tymika Grilli  

## 2018-09-08 ENCOUNTER — Other Ambulatory Visit: Payer: Self-pay | Admitting: Family Medicine

## 2018-10-25 ENCOUNTER — Ambulatory Visit (INDEPENDENT_AMBULATORY_CARE_PROVIDER_SITE_OTHER): Payer: PPO | Admitting: Family Medicine

## 2018-10-25 ENCOUNTER — Encounter: Payer: Self-pay | Admitting: Family Medicine

## 2018-10-25 ENCOUNTER — Ambulatory Visit
Admission: RE | Admit: 2018-10-25 | Discharge: 2018-10-25 | Disposition: A | Discharge status: Home / Self Care | Payer: PPO | Source: Ambulatory Visit | Attending: Family Medicine | Admitting: Family Medicine

## 2018-10-25 ENCOUNTER — Ambulatory Visit
Admission: RE | Admit: 2018-10-25 | Discharge: 2018-10-25 | Disposition: A | Discharge status: Home / Self Care | Payer: PPO | Attending: Family Medicine | Admitting: Family Medicine

## 2018-10-25 VITALS — BP 102/60 | HR 78 | Temp 98.0°F | Resp 16 | Wt 173.0 lb

## 2018-10-25 DIAGNOSIS — G8929 Other chronic pain: Secondary | ICD-10-CM | POA: Diagnosis not present

## 2018-10-25 DIAGNOSIS — M7731 Calcaneal spur, right foot: Secondary | ICD-10-CM | POA: Diagnosis not present

## 2018-10-25 DIAGNOSIS — M79671 Pain in right foot: Secondary | ICD-10-CM

## 2018-10-25 MED ORDER — DICLOFENAC SODIUM 75 MG PO TBEC: 75.0000 mg | DELAYED_RELEASE_TABLET | Freq: Two times a day (BID) | ORAL | 0 refills | Status: DC

## 2018-10-25 NOTE — Progress Notes (Signed)
Patient: Monisha Siebel Female    DOB: 1950/11/17   68 y.o.   MRN: [id number] Visit Date: 10/25/2018  Today's Provider: Vernie Murders, PA   Chief Complaint  Patient presents with  . Foot Pain   Subjective:     HPI   Patient has had right foot pain and swelling for 2 months. Patient stated the pain started in her heel. Patient stated she has been taking ibuprofen occasionally and wearing a shoe insert. Patient states she has not had much relief.   Past Medical History:  Diagnosis Date  . Cancer ([place]) 03/2012   uterine  . History of chicken pox   . History of measles   . History of mumps   . Hypercholesterolemia   . Hypothyroidism 2013   Past Surgical History:  Procedure Laterality Date  . ABDOMINAL HYSTERECTOMY  04/18/2012   cancer at [facility]  . BILATERAL SALPINGOOPHORECTOMY  6/20123  . BREAST BIOPSY Left 05/08/2016   neg- Byrnett  . BREAST BIOPSY Right 10/2015   neg- [facility]  . LYMPHADENECTOMY  03/2012  . 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 TONSILLECTOMY AND ADENOIDECTOMY  1970  . TUBAL LIGATION  1978   Family History  Problem Relation Age of Onset  . Lung disease Mother   . COPD Mother   . Hypertension Mother   . Heart disease Mother   . Lung disease Father   . Healthy Sister   . Healthy Sister   . Heart attack Maternal Grandmother   . Alzheimer's disease Paternal Grandmother   . Coronary artery disease Other   . Hypertension Maternal Uncle   . Breast cancer Paternal Aunt 66  . Lung cancer Maternal Uncle 71   Allergies  Allergen Reactions  . Codeine Hives  . Penicillins Swelling  . Tetracycline Hives  . Sulfa Antibiotics     Other reaction(s): Skin Rashes    Current Outpatient Medications:  .  Aspirin 81 MG EC tablet, 1 tablet every other day. , Disp: , Rfl:  .  levothyroxine (SYNTHROID, LEVOTHROID) 50 MCG tablet, Take 1 tablet (50 mcg total) by mouth daily., Disp: 90 tablet, Rfl: 3 .  lovastatin (MEVACOR) 40 MG tablet, TAKE 1 TABLET  BY MOUTH AT BEDTIME, Disp: 90 tablet, Rfl: 3 .  Multiple Vitamin (MULTIVITAMIN) capsule, Take 1 capsule by mouth daily., Disp: , Rfl:  .  omeprazole (PRILOSEC) 20 MG capsule, Take 1 capsule (20 mg total) by mouth daily. (Patient not taking: Reported on 10/25/2018), Disp: 30 capsule, Rfl: 3  Review of Systems  Constitutional: Negative for appetite change, chills, fatigue and fever.  Respiratory: Negative for chest tightness and shortness of breath.   Cardiovascular: Negative for chest pain and palpitations.  Gastrointestinal: Negative for abdominal pain, nausea and vomiting.  Neurological: Negative for dizziness and weakness.   Social History   Tobacco Use  . Smoking status: Former Smoker    Packs/day: 0.50    Years: 9.00    Pack years: 4.50    Types: Cigarettes    Last attempt to quit: 04/26/1992    Years since quitting: 26.5  . Smokeless tobacco: Never Used  . Tobacco comment: QUIT IN 1996  Substance Use Topics  . Alcohol use: No    Alcohol/week: 0.0 standard drinks     Objective:   BP 102/60 (BP Location: Right Arm, Patient Position: Sitting, Cuff Size: Large)   Pulse 78   Temp 98 F (36.7 C) (Oral)  Resp 16   Wt 173 lb (78.5 kg)   SpO2 98%   BMI 28.79 kg/m  Vitals:   10/25/18 0848  BP: 102/60  Pulse: 78  Resp: 16  Temp: 98 F (36.7 C)  TempSrc: Oral  SpO2: 98%  Weight: 173 lb (78.5 kg)   Physical Exam Constitutional:      General: She is not in acute distress.    Appearance: She is well-developed.  HENT:     Head: Normocephalic and atraumatic.     Right Ear: Hearing normal.     Left Ear: Hearing normal.     Nose: Nose normal.  Eyes:     General: Lids are normal. No scleral icterus.       Right eye: No discharge.        Left eye: No discharge.     Conjunctiva/sclera: Conjunctivae normal.  Pulmonary:     Effort: Pulmonary effort is normal. No respiratory distress.  Musculoskeletal: Normal range of motion.        General: Tenderness present.      Comments: Tender right anterior heel plantar surface. Minimal puffiness. Good pulses. No numbness.  Skin:    Findings: No lesion or rash.  Neurological:     Mental Status: She is alert and oriented to person, place, and time.  Psychiatric:        Speech: Speech normal.        Behavior: Behavior normal.        Thought Content: Thought content normal.       Assessment & Plan    1. Chronic heel pain, right Onset over the past 2 months without a specific injury. Has a history of overuse standing at work a lot each day. Has tried different shoes and heel pads with minimal relief. Will get x-ray to rule out bony abnormality, treat with ice massage, stretching exercises and NSAID. Use shoes with good arch support and follow up pending x-ray reports. May need referral to podiatrist if no improvement in the next 2-4 weeks. - diclofenac (VOLTAREN) 75 MG EC tablet; Take 1 tablet (75 mg total) by mouth 2 (two) times daily.  Dispense: 30 tablet; Refill: 0 - DG Foot Complete Right     Vernie Murders, PA  [facility]

## 2018-10-25 NOTE — Patient Instructions (Signed)
Plantar Fasciitis      Plantar fasciitis is a painful foot condition that affects the heel. It occurs when the band of tissue that connects the toes to the heel bone (plantar fascia) becomes irritated. This can happen as the result of exercising too much or doing other repetitive activities (overuse injury).  The pain from plantar fasciitis can range from mild irritation to severe pain that makes it difficult to walk or move. The pain is usually worse in the morning after sleeping, or after sitting or lying down for a while. Pain may also be worse after long periods of walking or standing.  What are the causes?  This condition may be caused by:  Standing for long periods of time.  Wearing shoes that do not have good arch support.  Doing activities that put stress on joints (high-impact activities), including running, aerobics, and ballet.  Being overweight.  An abnormal way of walking (gait).  Tight muscles in the back of your lower leg (calf).  High arches in your feet.  Starting a new athletic activity.  What are the signs or symptoms?  The main symptom of this condition is heel pain. Pain may:  Be worse with first steps after a time of rest, especially in the morning after sleeping or after you have been sitting or lying down for a while.  Be worse after long periods of standing still.  Decrease after 30-45 minutes of activity, such as gentle walking.  How is this diagnosed?  This condition may be diagnosed based on your medical history and your symptoms. Your health care provider may ask questions about your activity level. Your health care provider will do a physical exam to check for:  A tender area on the bottom of your foot.  A high arch in your foot.  Pain when you move your foot.  Difficulty moving your foot.  You may have imaging tests to confirm the diagnosis, such as:  X-rays.  Ultrasound.  MRI.  How is this treated?  Treatment for plantar fasciitis depends on how severe your condition is.  Treatment may include:  Rest, ice, applying pressure (compression), and raising the affected foot (elevation). This may be called RICE therapy. Your health care provider may recommend RICE therapy along with over-the-counter pain medicines to manage your pain.  Exercises to stretch your calves and your plantar fascia.  A splint that holds your foot in a stretched, upward position while you sleep (night splint).  Physical therapy to relieve symptoms and prevent problems in the future.  Injections of steroid medicine (cortisone) to relieve pain and inflammation.  Stimulating your plantar fascia with electrical impulses (extracorporeal shock wave therapy). This is usually the last treatment option before surgery.  Surgery, if other treatments have not worked after 12 months.  Follow these instructions at home:      Managing pain, stiffness, and swelling  If directed, put ice on the painful area:  Put ice in a plastic bag, or use a frozen bottle of water.  Place a towel between your skin and the bag or bottle.  Roll the bottom of your foot over the bag or bottle.  Do this for 20 minutes, 2-3 times a day.  Wear athletic shoes that have air-sole or gel-sole cushions, or try wearing soft shoe inserts that are designed for plantar fasciitis.  Raise (elevate) your foot above the level of your heart while you are sitting or lying down.  Activity  Avoid activities that cause pain.   Ask your health care provider what activities are safe for you.  Do physical therapy exercises and stretches as told by your health care provider.  Try activities and forms of exercise that are easier on your joints (low-impact). Examples include swimming, water aerobics, and biking.  General instructions  Take over-the-counter and prescription medicines only as told by your health care provider.  Wear a night splint while sleeping, if told by your health care provider. Loosen the splint if your toes tingle, become numb, or turn cold and  blue.  Maintain a healthy weight, or work with your health care provider to lose weight as needed.  Keep all follow-up visits as told by your health care provider. This is important.  Contact a health care provider if you:  Have symptoms that do not go away after caring for yourself at home.  Have pain that gets worse.  Have pain that affects your ability to move or do your daily activities.  Summary  Plantar fasciitis is a painful foot condition that affects the heel. It occurs when the band of tissue that connects the toes to the heel bone (plantar fascia) becomes irritated.  The main symptom of this condition is heel pain that may be worse after exercising too much or standing still for a long time.  Treatment varies, but it usually starts with rest, ice, compression, and elevation (RICE therapy) and over-the-counter medicines to manage pain.  This information is not intended to replace advice given to you by your health care provider. Make sure you discuss any questions you have with your health care provider.  Document Released: 07/04/2001 Document Revised: 08/06/2017 Document Reviewed: 08/06/2017  Elsevier Interactive Patient Education © 2019 [employer]

## 2018-11-11 DIAGNOSIS — H43819 Vitreous degeneration, unspecified eye: Secondary | ICD-10-CM | POA: Diagnosis not present

## 2018-11-11 DIAGNOSIS — H2513 Age-related nuclear cataract, bilateral: Secondary | ICD-10-CM | POA: Diagnosis not present

## 2018-11-11 DIAGNOSIS — H401131 Primary open-angle glaucoma, bilateral, mild stage: Secondary | ICD-10-CM | POA: Diagnosis not present

## 2019-03-22 ENCOUNTER — Other Ambulatory Visit: Payer: Self-pay | Admitting: Family Medicine

## 2019-04-15 ENCOUNTER — Other Ambulatory Visit: Payer: Self-pay | Admitting: Family Medicine

## 2019-04-15 DIAGNOSIS — K219 Gastro-esophageal reflux disease without esophagitis: Secondary | ICD-10-CM

## 2019-04-15 DIAGNOSIS — R490 Dysphonia: Secondary | ICD-10-CM

## 2019-04-15 MED ORDER — OMEPRAZOLE 20 MG PO CPDR: 20.0000 mg | DELAYED_RELEASE_CAPSULE | Freq: Every day | ORAL | 12 refills | Status: DC

## 2019-04-15 NOTE — Telephone Encounter (Signed)
[facility] faxed refill request for the following medications:   omeprazole (PRILOSEC) 20 MG capsule   Please advise.  

## 2019-05-28 ENCOUNTER — Ambulatory Visit: Payer: PPO

## 2019-06-03 ENCOUNTER — Ambulatory Visit (INDEPENDENT_AMBULATORY_CARE_PROVIDER_SITE_OTHER): Payer: PPO

## 2019-06-03 ENCOUNTER — Other Ambulatory Visit: Payer: Self-pay

## 2019-06-03 DIAGNOSIS — Z0000 Encounter for general adult medical examination without abnormal findings: Secondary | ICD-10-CM | POA: Diagnosis not present

## 2019-06-03 DIAGNOSIS — Z1231 Encounter for screening mammogram for malignant neoplasm of breast: Secondary | ICD-10-CM

## 2019-06-03 NOTE — Patient Instructions (Signed)
[name] , Thank you for taking time to come for your Medicare Wellness Visit. I appreciate your ongoing commitment to your health goals. Please review the following plan we discussed and let me know if I can assist you in the future.   Screening recommendations/referrals: Colonoscopy: Cologuard up to date, due 03/27/21 Mammogram: Ordered today. Pt provided with contact info and advised to call to schedule appt. Pt aware the office will call re: appt. Bone Density: Up to date, due 01/2020 Recommended yearly ophthalmology/optometry visit for glaucoma screening and checkup Recommended yearly dental visit for hygiene and checkup  Vaccinations: Influenza vaccine: Due fall 2020 Pneumococcal vaccine: Completed series Tdap vaccine: Pt declines today.  Shingles vaccine: Pt declines today.     Advanced directives: Please bring a copy of your POA (Power of Attorney) and/or Living Will to your next appointment.   Conditions/risks identified: Continue to work towards cutting out sweets and junk foods in diet and focus on eating healthy substitutes such as fruits and proteins.   Next appointment: 07/2319 @ 10:00 AM with Dr Caryn Section.    Preventive Care 68 Years and Older, Female Preventive care refers to lifestyle choices and visits with your health care provider that can promote health and wellness. What does preventive care include?  A yearly physical exam. This is also called an annual well check.  Dental exams once or twice a year.  Routine eye exams. Ask your health care provider how often you should have your eyes checked.  Personal lifestyle choices, including:  Daily care of your teeth and gums.  Regular physical activity.  Eating a healthy diet.  Avoiding tobacco and drug use.  Limiting alcohol use.  Practicing safe sex.  Taking low-dose aspirin every day.  Taking vitamin and mineral supplements as recommended by your health care provider. What happens during an annual well  check? The services and screenings done by your health care provider during your annual well check will depend on your age, overall health, lifestyle risk factors, and family history of disease. Counseling  Your health care provider may ask you questions about your:  Alcohol use.  Tobacco use.  Drug use.  Emotional well-being.  Home and relationship well-being.  Sexual activity.  Eating habits.  History of falls.  Memory and ability to understand (cognition).  Work and work Statistician.  Reproductive health. Screening  You may have the following tests or measurements:  Height, weight, and BMI.  Blood pressure.  Lipid and cholesterol levels. These may be checked every 5 years, or more frequently if you are over 32 years old.  Skin check.  Lung cancer screening. You may have this screening every year starting at age 49 if you have a 30-pack-year history of smoking and currently smoke or have quit within the past 15 years.  Fecal occult blood test (FOBT) of the stool. You may have this test every year starting at age 23.  Flexible sigmoidoscopy or colonoscopy. You may have a sigmoidoscopy every 5 years or a colonoscopy every 10 years starting at age 84.  Hepatitis C blood test.  Hepatitis B blood test.  Sexually transmitted disease (STD) testing.  Diabetes screening. This is done by checking your blood sugar (glucose) after you have not eaten for a while (fasting). You may have this done every 1-3 years.  Bone density scan. This is done to screen for osteoporosis. You may have this done starting at age 32.  Mammogram. This may be done every 1-2 years. Talk to your  health care provider about how often you should have regular mammograms. Talk with your health care provider about your test results, treatment options, and if necessary, the need for more tests. Vaccines  Your health care provider may recommend certain vaccines, such as:  Influenza vaccine. This is  recommended every year.  Tetanus, diphtheria, and acellular pertussis (Tdap, Td) vaccine. You may need a Td booster every 10 years.  Zoster vaccine. You may need this after age 44.  Pneumococcal 13-valent conjugate (PCV13) vaccine. One dose is recommended after age 53.  Pneumococcal polysaccharide (PPSV23) vaccine. One dose is recommended after age 26. Talk to your health care provider about which screenings and vaccines you need and how often you need them. This information is not intended to replace advice given to you by your health care provider. Make sure you discuss any questions you have with your health care provider. Document Released: 11/05/2015 Document Revised: 06/28/2016 Document Reviewed: 08/10/2015 Elsevier Interactive Patient Education  2017 [place] Prevention in the Home Falls can cause injuries. They can happen to people of all ages. There are many things you can do to make your home safe and to help prevent falls. What can I do on the outside of my home?  Regularly fix the edges of walkways and driveways and fix any cracks.  Remove anything that might make you trip as you walk through a door, such as a raised step or threshold.  Trim any bushes or trees on the path to your home.  Use bright outdoor lighting.  Clear any walking paths of anything that might make someone trip, such as rocks or tools.  Regularly check to see if handrails are loose or broken. Make sure that both sides of any steps have handrails.  Any raised decks and porches should have guardrails on the edges.  Have any leaves, snow, or ice cleared regularly.  Use sand or salt on walking paths during winter.  Clean up any spills in your garage right away. This includes oil or grease spills. What can I do in the bathroom?  Use night lights.  Install grab bars by the toilet and in the tub and shower. Do not use towel bars as grab bars.  Use non-skid mats or decals in the tub or  shower.  If you need to sit down in the shower, use a plastic, non-slip stool.  Keep the floor dry. Clean up any water that spills on the floor as soon as it happens.  Remove soap buildup in the tub or shower regularly.  Attach bath mats securely with double-sided non-slip rug tape.  Do not have throw rugs and other things on the floor that can make you trip. What can I do in the bedroom?  Use night lights.  Make sure that you have a light by your bed that is easy to reach.  Do not use any sheets or blankets that are too big for your bed. They should not hang down onto the floor.  Have a firm chair that has side arms. You can use this for support while you get dressed.  Do not have throw rugs and other things on the floor that can make you trip. What can I do in the kitchen?  Clean up any spills right away.  Avoid walking on wet floors.  Keep items that you use a lot in easy-to-reach places.  If you need to reach something above you, use a strong step stool that has a  grab bar.  Keep electrical cords out of the way.  Do not use floor polish or wax that makes floors slippery. If you must use wax, use non-skid floor wax.  Do not have throw rugs and other things on the floor that can make you trip. What can I do with my stairs?  Do not leave any items on the stairs.  Make sure that there are handrails on both sides of the stairs and use them. Fix handrails that are broken or loose. Make sure that handrails are as long as the stairways.  Check any carpeting to make sure that it is firmly attached to the stairs. Fix any carpet that is loose or worn.  Avoid having throw rugs at the top or bottom of the stairs. If you do have throw rugs, attach them to the floor with carpet tape.  Make sure that you have a light switch at the top of the stairs and the bottom of the stairs. If you do not have them, ask someone to add them for you. What else can I do to help prevent falls?   Wear shoes that:  Do not have high heels.  Have rubber bottoms.  Are comfortable and fit you well.  Are closed at the toe. Do not wear sandals.  If you use a stepladder:  Make sure that it is fully opened. Do not climb a closed stepladder.  Make sure that both sides of the stepladder are locked into place.  Ask someone to hold it for you, if possible.  Clearly mark and make sure that you can see:  Any grab bars or handrails.  First and last steps.  Where the edge of each step is.  Use tools that help you move around (mobility aids) if they are needed. These include:  Canes.  Walkers.  Scooters.  Crutches.  Turn on the lights when you go into a dark area. Replace any light bulbs as soon as they burn out.  Set up your furniture so you have a clear path. Avoid moving your furniture around.  If any of your floors are uneven, fix them.  If there are any pets around you, be aware of where they are.  Review your medicines with your doctor. Some medicines can make you feel dizzy. This can increase your chance of falling. Ask your doctor what other things that you can do to help prevent falls. This information is not intended to replace advice given to you by your health care provider. Make sure you discuss any questions you have with your health care provider. Document Released: 08/05/2009 Document Revised: 03/16/2016 Document Reviewed: 11/13/2014 Elsevier Interactive Patient Education  2017 [employer].

## 2019-06-03 NOTE — Progress Notes (Signed)
Subjective:   Melinda Mack is a 68 y.o. female who presents for Medicare Annual (Subsequent) preventive examination.    This visit is being conducted through telemedicine due to the COVID-19 pandemic. This patient has given me verbal consent via doximity to conduct this visit, patient states they are participating from their home address. Some vital signs may be absent or patient reported.    Patient identification: identified by name, DOB, and current address  Review of Systems:  N/A  Cardiac Risk Factors include: advanced age (>77men, >68 women);dyslipidemia     Objective:     Vitals: There were no vitals taken for this visit.  There is no height or weight on file to calculate BMI. Unable to obtain vitals due to visit being conducted via telephonically.   Advanced Directives 06/03/2019 05/24/2018 04/24/2017 07/05/2015  Does Patient Have a Medical Advance Directive? Yes No No Yes  Type of Advance Directive Living will;Healthcare Power of Attorney - - -  [place] in Chart? No - copy requested - - No - copy requested  Would patient like information on creating a medical advance directive? - Yes (MAU/Ambulatory/Procedural Areas - Information given) Yes (ED - Information included in AVS) -    Tobacco Social History   Tobacco Use  Smoking Status Former Smoker  . Packs/day: 0.50  . Years: 9.00  . Pack years: 4.50  . Types: Cigarettes  . Quit date: 04/26/1992  . Years since quitting: 27.1  Smokeless Tobacco Never Used  Tobacco Comment   QUIT IN 1996     Counseling given: Not Answered Comment: QUIT IN 1996   Clinical Intake:  Pre-visit preparation completed: Yes  Pain : No/denies pain Pain Score: 0-No pain     Nutritional Risks: None Diabetes: No  How often do you need to have someone help you when you read instructions, pamphlets, or other written materials from your doctor or pharmacy?: 1 - Never  Interpreter Needed?: No   Information entered by :: [facility], LPN  Past Medical History:  Diagnosis Date  . Cancer ([place]) 03/2012   uterine  . History of chicken pox   . History of measles   . History of mumps   . Hypercholesterolemia   . Hypothyroidism 2013   Past Surgical History:  Procedure Laterality Date  . ABDOMINAL HYSTERECTOMY  04/18/2012   cancer at [facility]  . BILATERAL SALPINGOOPHORECTOMY  6/20123  . BREAST BIOPSY Left 05/08/2016   neg- [facility]  . BREAST BIOPSY Right 10/2015   neg- [facility]  . LYMPHADENECTOMY  03/2012  . 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 TONSILLECTOMY AND ADENOIDECTOMY  1970  . TUBAL LIGATION  1978   Family History  Problem Relation Age of Onset  . Lung disease Mother   . COPD Mother   . Hypertension Mother   . Heart disease Mother   . Lung disease Father   . Healthy Sister   . Healthy Sister   . Heart attack Maternal Grandmother   . Alzheimer's disease Paternal Grandmother   . Coronary artery disease Other   . Hypertension Maternal Uncle   . Breast cancer Paternal Aunt 20  . Lung cancer Maternal Uncle 71   Social History   Socioeconomic History  . Marital status: Widowed    Spouse name: Not on file  . Number of children: 3  . Years of education: 3  . Highest education level: 12th grade  Occupational History    Comment:  [employer]  Social Needs  . Financial resource strain: Not hard at all  . Food insecurity    Worry: Never true    Inability: Never true  . Transportation needs    Medical: No    Non-medical: No  Tobacco Use  . Smoking status: Former Smoker    Packs/day: 0.50    Years: 9.00    Pack years: 4.50    Types: Cigarettes    Quit date: 04/26/1992    Years since quitting: 27.1  . Smokeless tobacco: Never Used  . Tobacco comment: QUIT IN 1996  Substance and Sexual Activity  . Alcohol use: No    Alcohol/week: 0.0 standard drinks  . Drug use: No  . Sexual activity: Not on file  Lifestyle  . Physical activity    Days per week:  0 days    Minutes per session: 0 min  . Stress: Not at all  Relationships  . Social Herbalist on phone: Patient refused    Gets together: Patient refused    Attends religious service: Patient refused    Active member of club or organization: Patient refused    Attends meetings of clubs or organizations: Patient refused    Relationship status: Patient refused  Other Topics Concern  . Not on file  Social History Narrative   Widowed   Caffeine use- coffee - 2 cups daily    Outpatient Encounter Medications as of 06/03/2019  Medication Sig  . Aspirin 81 MG EC tablet 1 tablet every other day.   . levothyroxine (SYNTHROID) 50 MCG tablet Take 1 tablet by mouth once daily  . lovastatin (MEVACOR) 40 MG tablet TAKE 1 TABLET BY MOUTH AT BEDTIME  . Multiple Vitamin (MULTIVITAMIN) capsule Take 1 capsule by mouth daily.  Marland Kitchen omeprazole (PRILOSEC) 20 MG capsule Take 1 capsule (20 mg total) by mouth daily. (Patient taking differently: Take 20 mg by mouth daily. As needed)  . diclofenac (VOLTAREN) 75 MG EC tablet Take 1 tablet (75 mg total) by mouth 2 (two) times daily. (Patient not taking: Reported on 06/03/2019)   No facility-administered encounter medications on file as of 06/03/2019.     Activities of Daily Living In your present state of health, do you have any difficulty performing the following activities: 06/03/2019  Hearing? N  Vision? N  Difficulty concentrating or making decisions? N  Walking or climbing stairs? Y  Comment SOB and tiring  Dressing or bathing? N  Doing errands, shopping? N  Preparing Food and eating ? N  Using the Toilet? N  In the past six months, have you accidently leaked urine? N  Do you have problems with loss of bowel control? N  Managing your Medications? N  Managing your Finances? N  Housekeeping or managing your Housekeeping? N  Some recent data might be hidden    Patient Care Team: Birdie Sons, MD as PCP - General (Family Medicine)  Anell Barr, OD as Consulting Physician (Optometry) Will Bonnet, MD as Attending Physician (Obstetrics and Gynecology)    Assessment:   This is a routine wellness examination for Melinda Mack.  Exercise Activities and Dietary recommendations Current Exercise Habits: The patient does not participate in regular exercise at present, Exercise limited by: Other - see comments(stays busy @ work)  Goals    . Cut out extra servings     Recommend cutting out junk food and sweets in daily diet. Pt to focus on protein snacks in between meals.     Marland Kitchen  DIET - REDUCE SUGAR INTAKE     Recommend cutting out all desserts and junk food and substituting for fruits.        Fall Risk: Fall Risk  06/03/2019 05/24/2018 04/24/2017 04/17/2017 07/05/2015  Falls in the past year? 0 No No No No    FALL RISK PREVENTION PERTAINING TO THE HOME:  Any stairs in or around the home? No  If so, are there any without handrails? N/A  Home free of loose throw rugs in walkways, pet beds, electrical cords, etc? Yes  Adequate lighting in your home to reduce risk of falls? Yes   ASSISTIVE DEVICES UTILIZED TO PREVENT FALLS:  Life alert? No  Use of a cane, walker or w/c? No  Grab bars in the bathroom? No  Shower chair or bench in shower? No  Elevated toilet seat or a handicapped toilet? No    TIMED UP AND GO:  Was the test performed? No .    Depression Screen PHQ 2/9 Scores 06/03/2019 05/24/2018 03/14/2018 04/24/2017  PHQ - 2 Score 1 1 1 2   PHQ- 9 Score - - - 8     Cognitive Function: Declined today.     6CIT Screen 04/24/2017  What Year? 0 points  What month? 0 points  What time? 0 points  Count back from 20 0 points  Months in reverse 0 points  Repeat phrase 0 points  Total Score 0    Immunization History  Administered Date(s) Administered  . Influenza, High Dose Seasonal PF 07/24/2018  . Influenza,inj,Quad PF,6+ Mos 06/16/2015, 08/01/2016  . Influenza-Unspecified 08/18/2017  . Pneumococcal  Conjugate-13 12/06/2016  . Pneumococcal Polysaccharide-23 07/24/2018  . Tdap 01/21/2009  . Zoster 08/28/2011    Qualifies for Shingles Vaccine? Yes  Zostavax completed 09/07/11. Due for Shingrix. Education has been provided regarding the importance of this vaccine. Pt has been advised to call insurance company to determine out of pocket expense. Advised may also receive vaccine at local pharmacy or Health Dept. Verbalized acceptance and understanding.  Tdap: Although this vaccine is not a covered service during a Wellness Exam, does the patient still wish to receive this vaccine today?  No .   Flu Vaccine: Due fall 2020  Pneumococcal Vaccine: Completed series  Screening Tests Health Maintenance  Topic Date Due  . MAMMOGRAM  11/17/2018  . TETANUS/TDAP  01/22/2019  . INFLUENZA VACCINE  05/24/2019  . DEXA SCAN  01/31/2020  . Fecal DNA (Cologuard)  03/27/2021  . Hepatitis C Screening  Completed  . PNA vac Low Risk Adult  Completed    Cancer Screenings:  Colorectal Screening: Cologuard completed 04/06/18. Repeat every 3 years.  Mammogram: Completed 11/17/16. Repeat every year. Ordered today. Pt provided with contact info and advised to call to schedule appt. Pt aware the office will call re: appt.  Bone Density: Completed 01/30/17. Results reflect  OSTEOPENIA. Repeat every 3 years.   Lung Cancer Screening: (Low Dose CT Chest recommended if Age 57-80 years, 30 pack-year currently smoking OR have quit w/in 15years.) does not qualify.   Additional Screening:  Hepatitis C Screening: Up to date  Vision Screening: Recommended annual ophthalmology exams for early detection of glaucoma and other disorders of the eye.  Dental Screening: Recommended annual dental exams for proper oral hygiene  Community Resource Referral:  CRR required this visit?  No       Plan:  I have personally reviewed and addressed the Medicare Annual Wellness questionnaire and have noted the following in the  patient's  chart:  A. Medical and social history B. Use of alcohol, tobacco or illicit drugs  C. Current medications and supplements D. Functional ability and status E.  Nutritional status F.  Physical activity G. Advance directives H. List of other physicians I.  Hospitalizations, surgeries, and ER visits in previous 12 months J.  [place] such as hearing and vision if needed, cognitive and depression L. Referrals and appointments   In addition, I have reviewed and discussed with patient certain preventive protocols, quality metrics, and best practice recommendations. A written personalized care plan for preventive services as well as general preventive health recommendations were provided to patient. Nurse Health Advisor  Signed,    Eloni Darius [place], [place]  [id number]/17/4944 Nurse Health Advisor   Nurse Notes: None.

## 2019-06-21 ENCOUNTER — Other Ambulatory Visit: Payer: Self-pay | Admitting: Family Medicine

## 2019-08-12 ENCOUNTER — Other Ambulatory Visit: Payer: Self-pay | Admitting: *Deleted

## 2019-08-12 DIAGNOSIS — Z20822 Contact with and (suspected) exposure to covid-19: Secondary | ICD-10-CM

## 2019-08-14 LAB — NOVEL CORONAVIRUS, NAA: SARS-CoV-2, NAA: NOT DETECTED

## 2019-08-15 ENCOUNTER — Encounter: Payer: PPO | Admitting: Family Medicine

## 2019-09-16 ENCOUNTER — Other Ambulatory Visit: Payer: Self-pay | Admitting: Family Medicine

## 2019-09-22 ENCOUNTER — Ambulatory Visit (INDEPENDENT_AMBULATORY_CARE_PROVIDER_SITE_OTHER): Payer: PPO | Admitting: Family Medicine

## 2019-09-22 ENCOUNTER — Other Ambulatory Visit: Payer: Self-pay

## 2019-09-22 ENCOUNTER — Encounter: Payer: Self-pay | Admitting: Family Medicine

## 2019-09-22 VITALS — Temp 98.4°F

## 2019-09-22 DIAGNOSIS — R6883 Chills (without fever): Secondary | ICD-10-CM

## 2019-09-22 DIAGNOSIS — Z20828 Contact with and (suspected) exposure to other viral communicable diseases: Secondary | ICD-10-CM | POA: Diagnosis not present

## 2019-09-22 DIAGNOSIS — R0981 Nasal congestion: Secondary | ICD-10-CM | POA: Diagnosis not present

## 2019-09-22 DIAGNOSIS — Z20822 Contact with and (suspected) exposure to covid-19: Secondary | ICD-10-CM

## 2019-09-22 MED ORDER — AZITHROMYCIN 250 MG PO TABS: ORAL_TABLET | ORAL | 0 refills | Status: DC

## 2019-09-22 NOTE — Progress Notes (Signed)
Melinda Mack  MRN: [id number] DOB: 09-09-1951  Subjective:  HPI   The patient is a 68 year old female who presents via phone visit for ear and eye pain, head congestion and cough.  She states it started about 2 weeks ago.  She has continued to work despite the symptoms and feeling as though she has had a fever.  Virtual Visit via Telephone Note  I connected with Melinda Mack on 09/22/19 at  3:20 PM EST by a telemedicine application and verified that I am speaking with the correct person using two identifiers.  Location: Patient: Work Provider: Office   I discussed the limitations of evaluation and management by telemedicine and the availability of in person appointments. The patient expressed understanding and agreed to proceed.  Past Surgical History:  Procedure Laterality Date  . ABDOMINAL HYSTERECTOMY  04/18/2012   cancer at [facility]  . BILATERAL SALPINGOOPHORECTOMY  6/20123  . BREAST BIOPSY Left 05/08/2016   neg- [facility]  . BREAST BIOPSY Right 10/2015   neg- [facility]  . LYMPHADENECTOMY  03/2012  . 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 TONSILLECTOMY AND ADENOIDECTOMY  1970  . TUBAL LIGATION  1978   Family History  Problem Relation Age of Onset  . Lung disease Mother   . COPD Mother   . Hypertension Mother   . Heart disease Mother   . Lung disease Father   . Healthy Sister   . Healthy Sister   . Heart attack Maternal Grandmother   . Alzheimer's disease Paternal Grandmother   . Coronary artery disease Other   . Hypertension Maternal Uncle   . Breast cancer Paternal Aunt 39  . Lung cancer Maternal Uncle 15    Patient Active Problem List   Diagnosis Date Noted  . Osteopenia 01/31/2017  . Abnormal mammogram of right breast 11/10/2015  . Fatigue 06/16/2015  . History of endometrial cancer 06/15/2015  . Heartburn 06/15/2015  . Hot flash, menopausal 06/15/2015  . Adult hypothyroidism 06/15/2015  . Insomnia 06/15/2015  . Seborrheic keratosis  06/15/2015  . Obesity 06/15/2015  . Pre-diabetes 06/15/2015  . Recurrent cystitis 06/15/2015  . Imbalance 06/15/2015  . Malignant neoplasm of body of uterus ([place]) 03/05/2012  . Abnormal C-reactive protein 02/02/2009  . Hypercholesterolemia without hypertriglyceridemia 02/02/2009  . Personal history of methicillin resistant Staphylococcus aureus 06/01/2008  . History of tobacco use 10/23/1994    Past Medical History:  Diagnosis Date  . Cancer ([place]) 03/2012   uterine  . History of chicken pox   . History of measles   . History of mumps   . Hypercholesterolemia   . Hypothyroidism 2013    Social History   Socioeconomic History  . Marital status: Widowed    Spouse name: Not on file  . Number of children: 3  . Years of education: 38  . Highest education level: 12th grade  Occupational History    Comment: [employer]  Social Needs  . Financial resource strain: Not hard at all  . Food insecurity    Worry: Never true    Inability: Never true  . Transportation needs    Medical: No    Non-medical: No  Tobacco Use  . Smoking status: Former Smoker    Packs/day: 0.50    Years: 9.00    Pack years: 4.50    Types: Cigarettes    Quit date: 04/26/1992    Years since quitting: 27.4  . Smokeless tobacco: Never Used  . Tobacco  comment: QUIT IN 1996  Substance and Sexual Activity  . Alcohol use: No    Alcohol/week: 0.0 standard drinks  . Drug use: No  . Sexual activity: Not on file  Lifestyle  . Physical activity    Days per week: 0 days    Minutes per session: 0 min  . Stress: Not at all  Relationships  . Social Herbalist on phone: Patient refused    Gets together: Patient refused    Attends religious service: Patient refused    Active member of club or organization: Patient refused    Attends meetings of clubs or organizations: Patient refused    Relationship status: Patient refused  . Intimate partner violence    Fear of current or ex partner:  Patient refused    Emotionally abused: Patient refused    Physically abused: Patient refused    Forced sexual activity: Patient refused  Other Topics Concern  . Not on file  Social History Narrative   Widowed   Caffeine use- coffee - 2 cups daily    Outpatient Encounter Medications as of 09/22/2019  Medication Sig Note  . Aspirin 81 MG EC tablet 1 tablet every other day.  06/15/2015: Received from: [facility]:   . diclofenac (VOLTAREN) 75 MG EC tablet Take 1 tablet (75 mg total) by mouth 2 (two) times daily. (Patient not taking: Reported on 06/03/2019)   . levothyroxine (SYNTHROID) 50 MCG tablet Take 1 tablet by mouth once daily   . lovastatin (MEVACOR) 40 MG tablet TAKE 1 TABLET BY MOUTH AT BEDTIME   . Multiple Vitamin (MULTIVITAMIN) capsule Take 1 capsule by mouth daily.   Marland Kitchen omeprazole (PRILOSEC) 20 MG capsule Take 1 capsule (20 mg total) by mouth daily. (Patient taking differently: Take 20 mg by mouth daily. As needed)    No facility-administered encounter medications on file as of 09/22/2019.     Allergies  Allergen Reactions  . Codeine Hives  . Penicillins Swelling  . Tetracycline Hives  . Sulfa Antibiotics     Other reaction(s): Skin Rashes    Review of Systems  Constitutional: Positive for chills and fever (feels like she has fever). Negative for diaphoresis and malaise/fatigue.  HENT: Positive for congestion, ear discharge, ear pain, sinus pain and sore throat.   Eyes: Positive for pain. Negative for discharge and redness.  Respiratory: Positive for cough, sputum production and shortness of breath. Negative for wheezing.   Cardiovascular: Negative for chest pain, palpitations and leg swelling.  Gastrointestinal: Negative for abdominal pain and diarrhea.  Musculoskeletal: Positive for myalgias.  Neurological: Positive for dizziness and headaches.    Objective:  Temp 98.4 F (36.9 C)   Physical Exam: No apparent respiratory distress.  No wheeze or cough.  Assessment and Plan :  1. Stuffy nose Onset over the past 2 weeks without fever, but, having right nasal stuffiness, throbbing of face, ear and eye on that side. Temp 98.4 today. Will treat with allergy meds (Allegra) and add nasal steroid spray (OTC). Given Z-pak for sinusitis and recommend testing for COVID. - azithromycin (ZITHROMAX) 250 MG tablet; Take 2 tablets by mouth today then one tablet daily for 4 days.  Dispense: 6 tablet; Refill: 0  2. Chills No fever. No wheezing or shortness of breath. Recommend COVID testing and will treat with antibiotic for suspected sinusitis. May use Tylenol and increase fluid intake. - Novel Coronavirus, NAA ([facility]) - azithromycin (ZITHROMAX) 250 MG tablet; Take 2 tablets by mouth  today then one tablet daily for 4 days.  Dispense: 6 tablet; Refill: 0  3. Suspected COVID-19 virus infection Slight cough and chills with stuffy nose, hoarseness without loss of taste or smell. States 9 co-workers tested positive in September but her test was negative. Denies any fever or body aches. Recommend she consider retesting with these symptoms the past 2 weeks. Should stay home in quarantine until testing completed. - Novel Coronavirus, NAA ([facility]) - MyChart COVID-19 home monitoring program; Future - Temperature monitoring; Future    I discussed the assessment and treatment plan with the patient. The patient was provided an opportunity to ask questions and all were answered. The patient agreed with the plan and demonstrated an understanding of the instructions.   The patient was advised to call back or seek an in-person evaluation if the symptoms worsen or if the condition fails to improve as anticipated.  I provided 15 minutes of non-face-to-face time during this encounter.

## 2019-09-23 ENCOUNTER — Other Ambulatory Visit: Payer: Self-pay

## 2019-09-23 DIAGNOSIS — Z20822 Contact with and (suspected) exposure to covid-19: Secondary | ICD-10-CM

## 2019-09-25 LAB — NOVEL CORONAVIRUS, NAA: SARS-CoV-2, NAA: NOT DETECTED

## 2019-10-23 DIAGNOSIS — Z20828 Contact with and (suspected) exposure to other viral communicable diseases: Secondary | ICD-10-CM | POA: Diagnosis not present

## 2019-10-28 ENCOUNTER — Encounter: Payer: PPO | Admitting: Family Medicine

## 2019-12-23 ENCOUNTER — Other Ambulatory Visit: Payer: Self-pay | Admitting: Family Medicine

## 2019-12-23 NOTE — Progress Notes (Signed)
Patient: Melinda Mack, Female    DOB: [birth year], 69 y.o.   MRN: [id number] Visit Date: 12/24/2019  Today's Provider: Lelon Huh, MD   Chief Complaint  Patient presents with  . Annual Exam  . Hyperlipidemia  . Hypothyroidism  . Pre-diabetes   Subjective:     Patient had a AWE with [facility] on 06/03/2019   Annual physical exam Melinda Mack is a 69 y.o. female who presents today for health maintenance and complete physical. She feels well. She reports no regular exercising . She reports she is sleeping well.  -----------------------------------------------------------------  Lipid/Cholesterol, Follow-up:   Last seen for this 1 year ago.  Management since that visit includes no changes.  Last Lipid Panel: Lab Results  Component Value Date   CHOL 178 03/14/2018   HDL 46 03/14/2018   LDLCALC 99 03/14/2018   TRIG 163 (H) 03/14/2018   CHOLHDL 3.9 03/14/2018   She reports good compliance with treatment. She is not having side effects.   Wt Readings from Last 3 Encounters:  12/24/19 175 lb 12.8 oz (79.7 kg)  10/25/18 173 lb (78.5 kg)  07/24/18 175 lb (79.4 kg)    ------------------------------------------------------------------------   Hypothyroid, follow-up:  TSH  Date Value Ref Range Status  03/14/2018 1.930 0.450 - 4.500 uIU/mL Final  12/08/2016 2.350 0.450 - 4.500 uIU/mL Final  06/16/2015 2.690 0.450 - 4.500 uIU/mL Final   Wt Readings from Last 3 Encounters:  12/24/19 175 lb 12.8 oz (79.7 kg)  10/25/18 173 lb (78.5 kg)  07/24/18 175 lb (79.4 kg)    She was last seen for hypothyroid 1 years ago.  Management since that visit includes no change. She reports good compliance with treatment. She is not having side effects.  She is not exercising. She is experiencing fatigue She denies change in energy level, diarrhea, heat / cold intolerance, nervousness, palpitations and weight changes Weight trend:  stable  ------------------------------------------------------------------------  Prediabetes, Follow-up:   Lab Results  Component Value Date   HGBA1C 6.1 (H) 07/24/2018   HGBA1C 5.9 (H) 03/14/2018   HGBA1C 6.1 12/06/2016   GLUCOSE 119 (H) 03/14/2018   GLUCOSE 115 (H) 05/01/2017   GLUCOSE 115 (H) 12/08/2016    Last seen for for this1 years ago.  Management since that visit includes no change. Current symptoms include none   Weight trend: stable Prior visit with dietician: no Current diet: in general, a "healthy" diet   Current exercise: none  Pertinent Labs:    Component Value Date/Time   CHOL 178 03/14/2018 1110   TRIG 163 (H) 03/14/2018 1110   CHOLHDL 3.9 03/14/2018 1110   CREATININE 0.99 03/14/2018 1110   CREATININE 0.98 04/23/2014 1212    Wt Readings from Last 3 Encounters:  12/24/19 175 lb 12.8 oz (79.7 kg)  10/25/18 173 lb (78.5 kg)  07/24/18 175 lb (79.4 kg)    States that since having Covid in January she still has trouble with sense of taste and smell and more fatigued, feels cold all the time.   Feels light headed and short of breath more frequently.No trouble sleeping or resting.  She also complains of joints of fingers being stiff and swollen in the mornings.   Review of Systems  Constitutional: Negative.   HENT: Negative.   Eyes: Negative.   Respiratory: Negative.   Cardiovascular: Negative.   Gastrointestinal: Negative.   Endocrine: Negative.   Genitourinary: Negative.   Musculoskeletal: Negative.   Skin: Negative.   Allergic/Immunologic: Negative.  Neurological: Negative.   Hematological: Negative.   Psychiatric/Behavioral: Negative.     Social History      She  reports that she quit smoking about 27 years ago. Her smoking use included cigarettes. She has a 4.50 pack-year smoking history. She has never used smokeless tobacco. She reports that she does not drink alcohol or use drugs.       Social History   Socioeconomic History  .  Marital status: Widowed    Spouse name: Not on file  . Number of children: 3  . Years of education: 19  . Highest education level: 12th grade  Occupational History    Comment: [employer]  Tobacco Use  . Smoking status: Former Smoker    Packs/day: 0.50    Years: 9.00    Pack years: 4.50    Types: Cigarettes    Quit date: 04/26/1992    Years since quitting: 27.6  . Smokeless tobacco: Never Used  . Tobacco comment: QUIT IN 1996  Substance and Sexual Activity  . Alcohol use: No    Alcohol/week: 0.0 standard drinks  . Drug use: No  . Sexual activity: Not on file  Other Topics Concern  . Not on file  Social History Narrative   Widowed   Caffeine use- coffee - 2 cups daily   Social Determinants of Health   Financial Resource Strain:   . Difficulty of Paying Living Expenses: Not on file  Food Insecurity:   . Worried About Charity fundraiser in the Last Year: Not on file  . Ran Out of Food in the Last Year: Not on file  Transportation Needs:   . Lack of Transportation (Medical): Not on file  . Lack of Transportation (Non-Medical): Not on file  Physical Activity: Inactive  . Days of Exercise per Week: 0 days  . Minutes of Exercise per Session: 0 min  Stress: No Stress Concern Present  . Feeling of Stress : Not at all  Social Connections: Unknown  . Frequency of Communication with Friends and Family: Patient refused  . Frequency of Social Gatherings with Friends and Family: Patient refused  . Attends Religious Services: Patient refused  . Active Member of Clubs or Organizations: Patient refused  . Attends Archivist Meetings: Patient refused  . Marital Status: Patient refused    Past Medical History:  Diagnosis Date  . Cancer ([place]) 03/2012   uterine  . History of chicken pox   . History of measles   . History of mumps   . Hypercholesterolemia   . Hypothyroidism 2013     Patient Active Problem List   Diagnosis Date Noted  . Osteopenia 01/31/2017   . Abnormal mammogram of right breast 11/10/2015  . Fatigue 06/16/2015  . History of endometrial cancer 06/15/2015  . Heartburn 06/15/2015  . Hot flash, menopausal 06/15/2015  . Adult hypothyroidism 06/15/2015  . Insomnia 06/15/2015  . Seborrheic keratosis 06/15/2015  . Obesity 06/15/2015  . Pre-diabetes 06/15/2015  . Recurrent cystitis 06/15/2015  . Imbalance 06/15/2015  . Malignant neoplasm of body of uterus ([place]) 03/05/2012  . Abnormal C-reactive protein 02/02/2009  . Hypercholesterolemia without hypertriglyceridemia 02/02/2009  . Personal history of methicillin resistant Staphylococcus aureus 06/01/2008  . History of tobacco use 10/23/1994    Past Surgical History:  Procedure Laterality Date  . ABDOMINAL HYSTERECTOMY  04/18/2012   [facility]  . BILATERAL SALPINGOOPHORECTOMY  6/20123  . BREAST BIOPSY Left 05/08/2016   neg- [facility]  . BREAST BIOPSY Right  10/2015   neg- [facility]  . LYMPHADENECTOMY  03/2012  . 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 TONSILLECTOMY AND ADENOIDECTOMY  1970  . TUBAL LIGATION  1978    Family History        Family Status  Relation Name Status  . Mother  Alive  . Father  Alive  . Sister 1 Alive  . Sister 2 Alive  . MGM  Deceased  . MGF  Deceased  . PGM  Deceased  . PGF  Deceased  . Other  (Not Specified)  . Mat Uncle  (Not Specified)  . Ethlyn Daniels  (Not Specified)  . Mat Uncle  (Not Specified)        Her family history includes Alzheimer's disease in her paternal grandmother; Breast cancer (age of onset: 35) in her paternal aunt; COPD in her mother; Coronary artery disease in an other family member; Healthy in her sister and sister; Heart attack in her maternal grandmother; Heart disease in her mother; Hypertension in her maternal uncle and mother; Lung cancer (age of onset: 69) in her maternal uncle; Lung disease in her father and mother.      Allergies  Allergen Reactions  . Codeine Hives  . Penicillins Swelling  . Tetracycline  Hives  . Sulfa Antibiotics     Other reaction(s): Skin Rashes     Current Outpatient Medications:  .  Aspirin 81 MG EC tablet, 1 tablet every other day. , Disp: , Rfl:  .  levothyroxine (SYNTHROID) 50 MCG tablet, Take 1 tablet by mouth once daily, Disp: 90 tablet, Rfl: 2 .  lovastatin (MEVACOR) 40 MG tablet, TAKE 1 TABLET BY MOUTH AT BEDTIME, Disp: 30 tablet, Rfl: 0 .  Multiple Vitamin (MULTIVITAMIN) capsule, Take 1 capsule by mouth daily., Disp: , Rfl:  .  omeprazole (PRILOSEC) 20 MG capsule, Take 1 capsule (20 mg total) by mouth daily. (Patient taking differently: Take 20 mg by mouth daily. As needed), Disp: 30 capsule, Rfl: 12   Patient Care Team: Birdie Sons, MD as PCP - General (Family Medicine) Anell Barr, OD as Consulting Physician (Optometry) Will Bonnet, MD as Attending Physician (Obstetrics and Gynecology)    Objective:    Vitals: BP 132/74 (BP Location: Right Arm, Patient Position: Sitting, Cuff Size: Normal)   Pulse 68   Temp (!) 97.5 F (36.4 C) (Temporal)   Ht 5\' 5"  (1.651 m)   Wt 175 lb 12.8 oz (79.7 kg)   BMI 29.25 kg/m    Vitals:   12/24/19 0857  BP: 132/74  Pulse: 68  Temp: (!) 97.5 F (36.4 C)  TempSrc: Temporal  Weight: 175 lb 12.8 oz (79.7 kg)  Height: 5\' 5"  (1.651 m)     Physical Exam   General Appearance:     Overweight female. Alert, cooperative, in no acute distress, appears stated age   Head:    Normocephalic, without obvious abnormality, atraumatic  Eyes:    PERRL, conjunctiva/corneas clear, EOM's intact, fundi    benign, both eyes  Ears:    Normal TM's and external ear canals, both ears  Nose:   Nares normal, septum midline, mucosa normal, no drainage    or sinus tenderness  Throat:   Lips, mucosa, and tongue normal; teeth and gums normal  Neck:   Supple, symmetrical, trachea midline, no adenopathy;    thyroid:  no enlargement/tenderness/nodules; no carotid   bruit or JVD  Back:     Symmetric, no curvature, ROM  normal, no CVA tenderness  Lungs:     Clear to auscultation bilaterally, respirations unlabored  Chest Wall:    No tenderness or deformity   Heart:    Normal heart rate. Normal rhythm. No murmurs, rubs, or gallops.   Breast Exam:    patient declined  Abdomen:     Soft, non-tender, bowel sounds active all four quadrants,    no masses, no organomegaly  Pelvic:    deferred and not indicated; post-menopausal, no abnormal Pap smears in past  Extremities:   All extremities are intact. No cyanosis or edema. Mild swelling of DIPs and PIPs. No wrist swelling or tenderness.   Pulses:   2+ and symmetric all extremities  Skin:   Skin color, texture, turgor normal, no rashes or lesions  Lymph nodes:   Cervical, supraclavicular, and axillary nodes normal  Neurologic:   CNII-XII intact, normal strength, sensation and reflexes    throughout    Depression Screen PHQ 2/9 Scores 06/03/2019 05/24/2018 03/14/2018 04/24/2017  PHQ - 2 Score 1 1 1 2   PHQ- 9 Score - - - 8       Assessment & Plan:     Routine Health Maintenance and Physical Exam  Exercise Activities and Dietary recommendations Goals    . Cut out extra servings     Recommend cutting out junk food and sweets in daily diet. Pt to focus on protein snacks in between meals.     Marland Kitchen DIET - REDUCE SUGAR INTAKE     Recommend cutting out all desserts and junk food and substituting for fruits.        Immunization History  Administered Date(s) Administered  . Influenza, High Dose Seasonal PF 07/24/2018  . Influenza,inj,Quad PF,6+ Mos 06/16/2015, 08/01/2016  . Influenza-Unspecified 08/18/2017  . Pneumococcal Conjugate-13 12/06/2016  . Pneumococcal Polysaccharide-23 07/24/2018  . Tdap 01/21/2009  . Zoster 08/28/2011    Health Maintenance  Topic Date Due  . MAMMOGRAM  11/17/2017  . TETANUS/TDAP  01/22/2019  . INFLUENZA VACCINE  01/21/2020 (Originally 05/24/2019)  . DEXA SCAN  01/31/2020  . Fecal DNA (Cologuard)  03/27/2021  . Hepatitis C  Screening  Completed  . PNA vac Low Risk Adult  Completed     Discussed health benefits of physical activity, and encouraged her to engage in regular exercise appropriate for her age and condition.    --------------------------------------------------------------------  1. Annual physical exam   2. History of COVID-19 Continues to have altered sense of taste and smell, mild dyspnea, and fatigue 6 weeks post Covid. Counseled regarding persistent symptoms occasionally seen after recovering from Covid infections. Will check labs as below.   3. Pre-diabetes  - HgB A1c  4. Adult hypothyroidism  - TSH - T4, free  5. Hypercholesterolemia without hypertriglyceridemia She is tolerating lovastatin well with no adverse effects.   - Comprehensive metabolic panel - CBC - Lipid panel  6. Bilateral finger arthralgia  - Rheumatoid Factor - CYCLIC CITRUL PEPTIDE ANTIBODY, IGG/IGA - ANA Direct w/Reflex if Positive  7. Need for influenza vaccination  - Flu Vaccine QUAD High Dose IM (Fluad)   The entirety of the information documented in the History of Present Illness, Review of Systems and Physical Exam were personally obtained by me. Portions of this information were initially documented by Idelle Jo, CMA and reviewed by me for thoroughness and accuracy.   Lelon Huh, MD  [facility]

## 2019-12-24 ENCOUNTER — Encounter: Payer: Self-pay | Admitting: Family Medicine

## 2019-12-24 ENCOUNTER — Other Ambulatory Visit: Payer: Self-pay

## 2019-12-24 ENCOUNTER — Ambulatory Visit (INDEPENDENT_AMBULATORY_CARE_PROVIDER_SITE_OTHER): Payer: PPO | Admitting: Family Medicine

## 2019-12-24 VITALS — BP 132/74 | HR 68 | Temp 97.5°F | Ht 65.0 in | Wt 175.8 lb

## 2019-12-24 DIAGNOSIS — E039 Hypothyroidism, unspecified: Secondary | ICD-10-CM | POA: Diagnosis not present

## 2019-12-24 DIAGNOSIS — M25542 Pain in joints of left hand: Secondary | ICD-10-CM | POA: Diagnosis not present

## 2019-12-24 DIAGNOSIS — Z8616 Personal history of COVID-19: Secondary | ICD-10-CM | POA: Diagnosis not present

## 2019-12-24 DIAGNOSIS — M25541 Pain in joints of right hand: Secondary | ICD-10-CM

## 2019-12-24 DIAGNOSIS — E7800 Pure hypercholesterolemia, unspecified: Secondary | ICD-10-CM | POA: Diagnosis not present

## 2019-12-24 DIAGNOSIS — R7303 Prediabetes: Secondary | ICD-10-CM | POA: Diagnosis not present

## 2019-12-24 DIAGNOSIS — Z23 Encounter for immunization: Secondary | ICD-10-CM

## 2019-12-24 DIAGNOSIS — Z0000 Encounter for general adult medical examination without abnormal findings: Secondary | ICD-10-CM

## 2019-12-24 NOTE — Telephone Encounter (Signed)
Requested Prescriptions  Pending Prescriptions Disp Refills  . lovastatin (MEVACOR) 40 MG tablet [Pharmacy Med Name: Lovastatin 40 MG Oral Tablet] 90 tablet 0    Sig: TAKE 1 TABLET BY MOUTH AT BEDTIME     Cardiovascular:  Antilipid - Statins Failed - 12/23/2019  6:10 PM      Failed - Total Cholesterol in normal range and within 360 days    Cholesterol, Total  Date Value Ref Range Status  03/14/2018 178 100 - 199 mg/dL Final         Failed - LDL in normal range and within 360 days    LDL Calculated  Date Value Ref Range Status  03/14/2018 99 0 - 99 mg/dL Final         Failed - HDL in normal range and within 360 days    HDL  Date Value Ref Range Status  03/14/2018 46 >39 mg/dL Final         Failed - Triglycerides in normal range and within 360 days    Triglycerides  Date Value Ref Range Status  03/14/2018 163 (H) 0 - 149 mg/dL Final         Failed - Valid encounter within last 12 months    Recent Outpatient Visits          3 months ago Stuffy nose   [place], [place]   1 year ago Chronic heel pain, right   [facility], Vickki Muff, [place]   1 year ago Annual physical exam   [facility] Birdie Sons, MD   1 year ago [place], Donald E, [place]   2 years ago [place], [place], [place] - Patient is not pregnant

## 2019-12-24 NOTE — Patient Instructions (Addendum)
.   Please review the attached list of medications and notify my office if there are any errors.   . Please bring all of your medications to every appointment so we can make sure that our medication list is the same as yours.   Please call the [facility] at [phone] to schedule your mammogram.

## 2019-12-26 ENCOUNTER — Encounter: Payer: Self-pay | Admitting: Family Medicine

## 2019-12-26 LAB — LIPID PANEL
Chol/HDL Ratio: 4 ratio (ref 0.0–4.4)
Cholesterol, Total: 179 mg/dL (ref 100–199)
HDL: 45 mg/dL (ref >39)
LDL Chol Calc (NIH): 92 mg/dL (ref 0–99)
Triglycerides: 249 mg/dL — ABNORMAL HIGH (ref 0–149)
VLDL Cholesterol Cal: 42 mg/dL — ABNORMAL HIGH (ref 5–40)

## 2019-12-26 LAB — CYCLIC CITRUL PEPTIDE ANTIBODY, IGG/IGA: Cyclic Citrullin Peptide Ab: 6 units (ref 0–19)

## 2019-12-26 LAB — CBC
Hematocrit: 40.2 % (ref 34.0–46.6)
Hemoglobin: 13.9 g/dL (ref 11.1–15.9)
MCH: 30.4 pg (ref 26.6–33.0)
MCHC: 34.6 g/dL (ref 31.5–35.7)
MCV: 88 fL (ref 79–97)
Platelets: 303 10*3/uL (ref 150–450)
RBC: 4.57 x10E6/uL (ref 3.77–5.28)
RDW: 13 % (ref 11.7–15.4)
WBC: 6.4 10*3/uL (ref 3.4–10.8)

## 2019-12-26 LAB — COMPREHENSIVE METABOLIC PANEL
ALT: 20 IU/L (ref 0–32)
AST: 21 IU/L (ref 0–40)
Albumin/Globulin Ratio: 1.8 (ref 1.2–2.2)
Albumin: 4.5 g/dL (ref 3.8–4.8)
Alkaline Phosphatase: 98 IU/L (ref 39–117)
BUN/Creatinine Ratio: 15 (ref 12–28)
BUN: 16 mg/dL (ref 8–27)
Bilirubin Total: 0.4 mg/dL (ref 0.0–1.2)
CO2: 21 mmol/L (ref 20–29)
Calcium: 9.6 mg/dL (ref 8.7–10.3)
Chloride: 106 mmol/L (ref 96–106)
Creatinine, Ser: 1.05 mg/dL — ABNORMAL HIGH (ref 0.57–1.00)
GFR calc Af Amer: 63 mL/min/{1.73_m2} (ref >59)
GFR calc non Af Amer: 55 mL/min/{1.73_m2} — ABNORMAL LOW (ref >59)
Globulin, Total: 2.5 g/dL (ref 1.5–4.5)
Glucose: 111 mg/dL — ABNORMAL HIGH (ref 65–99)
Potassium: 4.5 mmol/L (ref 3.5–5.2)
Sodium: 143 mmol/L (ref 134–144)
Total Protein: 7 g/dL (ref 6.0–8.5)

## 2019-12-26 LAB — ANA W/REFLEX IF POSITIVE: Anti Nuclear Antibody (ANA): NEGATIVE

## 2019-12-26 LAB — RHEUMATOID FACTOR: Rheumatoid fact SerPl-aCnc: <10 IU/mL (ref 0.0–13.9)

## 2019-12-26 LAB — HEMOGLOBIN A1C
Est. average glucose Bld gHb Est-mCnc: 126 mg/dL
Hgb A1c MFr Bld: 6 % — ABNORMAL HIGH (ref 4.8–5.6)

## 2019-12-26 LAB — T4, FREE: Free T4: 1.42 ng/dL (ref 0.82–1.77)

## 2019-12-26 LAB — TSH: TSH: 1.77 u[IU]/mL (ref 0.450–4.500)

## 2020-01-19 ENCOUNTER — Encounter: Payer: Self-pay | Admitting: Physician Assistant

## 2020-01-19 ENCOUNTER — Ambulatory Visit (INDEPENDENT_AMBULATORY_CARE_PROVIDER_SITE_OTHER): Payer: PPO | Admitting: Physician Assistant

## 2020-01-19 ENCOUNTER — Other Ambulatory Visit: Payer: Self-pay

## 2020-01-19 ENCOUNTER — Ambulatory Visit: Payer: Self-pay

## 2020-01-19 VITALS — BP 137/84 | HR 73 | Temp 96.9°F | Resp 16 | Wt 174.4 lb

## 2020-01-19 DIAGNOSIS — M791 Myalgia, unspecified site: Secondary | ICD-10-CM

## 2020-01-19 DIAGNOSIS — R109 Unspecified abdominal pain: Secondary | ICD-10-CM

## 2020-01-19 LAB — POCT URINALYSIS DIPSTICK
Bilirubin, UA: NEGATIVE
Glucose, UA: NEGATIVE
Ketones, UA: NEGATIVE
Leukocytes, UA: NEGATIVE
Nitrite, UA: NEGATIVE
Protein, UA: NEGATIVE
Spec Grav, UA: 1.01 (ref 1.010–1.025)
Urobilinogen, UA: 0.2 E.U./dL
pH, UA: 6 (ref 5.0–8.0)

## 2020-01-19 NOTE — Progress Notes (Signed)
Patient: Melinda Mack Female    DOB: 1951/06/28   69 y.o.   MRN: [id number] Visit Date: 01/19/2020  Today's Provider: Mar Daring, PA-C   Chief Complaint  Patient presents with  . Flank Pain   Subjective:     Flank Pain This is a new (Right) problem. The current episode started 1 to 4 weeks ago (Last Saturday). The problem occurs constantly. The problem has been gradually worsening since onset. The pain is present in the lumbar spine (From the tail bone to the mid right radiating to right of upper and lower abdomen). The quality of the pain is described as aching (spasm and swollen pressure). The pain is at a severity of 6/10 (6/10 pressure). The pain is the same all the time. The symptoms are aggravated by sitting, bending, position and twisting. Associated symptoms include abdominal pain (lower abdominal pain/tender off an on). Pertinent negatives include no dysuria, fever, leg pain, numbness or pelvic pain. She has tried heat (Tylenol, hot shower) for the symptoms. The treatment provided no relief.  Reports prior to the pain starting she was lifting and moving things at her work as well as working outside in the yard at home. Warm showers make feel better. Pain is moving around. No changes in bowel habits or urinary symptoms. Pain does not radiate to the groin. No gross hematuria noted. 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   Allergies  Allergen Reactions  . Codeine Hives  . Penicillins Swelling  . Tetracycline Hives  . Sulfa Antibiotics     Other reaction(s): Skin Rashes     Current Outpatient Medications:  .  Aspirin 81 MG EC tablet, 1 tablet every other day. , Disp: , Rfl:  .  levothyroxine (SYNTHROID) 50 MCG tablet, Take 1 tablet by mouth once daily, Disp: 90 tablet, Rfl: 2 .  lovastatin (MEVACOR) 40 MG tablet, TAKE 1 TABLET BY MOUTH AT BEDTIME, Disp: 30 tablet, Rfl: 0 .  Multiple Vitamin (MULTIVITAMIN) capsule, Take  1 capsule by mouth daily., Disp: , Rfl:  .  omeprazole (PRILOSEC) 20 MG capsule, Take 1 capsule (20 mg total) by mouth daily. (Patient not taking: Reported on 01/19/2020), Disp: 30 capsule, Rfl: 12  Review of Systems  Constitutional: Negative for fever.  Gastrointestinal: Positive for abdominal pain (lower abdominal pain/tender off an on).  Genitourinary: Positive for flank pain (right). Negative for decreased urine volume, difficulty urinating, dysuria, hematuria, pelvic pain and urgency.  Musculoskeletal: Positive for back pain and myalgias.  Neurological: Negative for numbness.    Social History   Tobacco Use  . Smoking status: Former Smoker    Packs/day: 0.50    Years: 9.00    Pack years: 4.50    Types: Cigarettes    Quit date: 04/26/1992    Years since quitting: 27.7  . Smokeless tobacco: Never Used  . Tobacco comment: QUIT IN 1996  Substance Use Topics  . Alcohol use: No    Alcohol/week: 0.0 standard drinks      Objective:   BP 137/84 (BP Location: Left Arm, Patient Position: Sitting, Cuff Size: Large)   Pulse 73   Temp (!) 96.9 F (36.1 C) (Temporal)   Resp 16   Wt 174 lb 6.4 oz (79.1 kg)   BMI 29.02 kg/m  Vitals:   01/19/20 1115  BP: 137/84  Pulse: 73  Resp: 16  Temp: (!) 96.9 F (36.1 C)  TempSrc: Temporal  Weight: 174 lb 6.4  oz (79.1 kg)  Body mass index is 29.02 kg/m.   Physical Exam Vitals reviewed.  Constitutional:      General: She is not in acute distress.    Appearance: Normal appearance. She is well-developed and normal weight. She is ill-appearing (uncomfortable; unable to stay in one position). She is not diaphoretic.  Cardiovascular:     Rate and Rhythm: Normal rate and regular rhythm.     Pulses: Normal pulses.     Heart sounds: Normal heart sounds. No murmur. No friction rub. No gallop.   Pulmonary:     Effort: Pulmonary effort is normal. No respiratory distress.     Breath sounds: Normal breath sounds. No wheezing or rales.    Abdominal:     General: Abdomen is flat. Bowel sounds are normal. There is no distension.     Palpations: Abdomen is soft. There is no mass.     Tenderness: There is no abdominal tenderness. There is no right CVA tenderness, left CVA tenderness, guarding or rebound.  Musculoskeletal:     Cervical back: Normal.     Thoracic back: Spasms and tenderness present. No bony tenderness. Normal range of motion. No scoliosis.     Lumbar back: Normal.       Back:  Skin:    General: Skin is warm and dry.  Neurological:     General: No focal deficit present.     Mental Status: She is alert and oriented to person, place, and time.      Results for orders placed or performed in visit on 01/19/20  POCT urinalysis dipstick  Result Value Ref Range   Color, UA light yello    Clarity, UA clear    Glucose, UA Negative Negative   Bilirubin, UA Negative    Ketones, UA Negative    Spec Grav, UA 1.010 1.010 - 1.025   Blood, UA Hemolyzed Moderate    pH, UA 6.0 5.0 - 8.0   Protein, UA Negative Negative   Urobilinogen, UA 0.2 0.2 or 1.0 E.U./dL   Nitrite, UA Negative    Leukocytes, UA Negative Negative   Appearance     Odor         Assessment & Plan    1. Trigger point Suspect overuse and trigger points/spasm causing pain. Discussed Toradol IM, NSAIDs, steroid taper and muscle relaxer. Patient declined these at this time. Will use Tylenol, heat, biofreeze massaged in, and light stretches. Possible could be kidney stone as well. Discussed imaging but will await microscopic analysis report to see if any crystals are present. She agrees. Push fluids. She will call if symptoms worsen or fail to resolve and she desires any of the above treatments.   2. Flank pain See above medical treatment plan. - Urine Microscopic     Mar Daring, PA-C  [facility]

## 2020-01-19 NOTE — Telephone Encounter (Signed)
Pt. Reports she has had right flank pain for over 1 week. "I kept hoping it would get better." Pian is at her right flank."It feels swollen and like something could burst." Does not remember any injury. No bladder symptoms and no fever. Sleeps through the night if she takes Tylenol at bedtime. No availability with her PCP. Appointment made.  Reason for Disposition . [1] MODERATE back pain (e.g., interferes with normal activities) AND [2] present > 3 days  Answer Assessment - Initial Assessment Questions 1. ONSET: "When did the pain begin?"      Last Saturday - 1 week ago 2. LOCATION: "Where does it hurt?" (upper, mid or lower back)     Right flank 3. SEVERITY: "How bad is the pain?"  (e.g., Scale 1-10; mild, moderate, or severe)   - MILD (1-3): doesn't interfere with normal activities    - MODERATE (4-7): interferes with normal activities or awakens from sleep    - SEVERE (8-10): excruciating pain, unable to do any normal activities      3-4 4. PATTERN: "Is the pain constant?" (e.g., yes, no; constant, intermittent)      Comes and goes 5. RADIATION: "Does the pain shoot into your legs or elsewhere?"     No 6. CAUSE:  "What do you think is causing the back pain?"      Unsure 7. BACK OVERUSE:  "Any recent lifting of heavy objects, strenuous work or exercise?"     No 8. MEDICATIONS: "What have you taken so far for the pain?" (e.g., nothing, acetaminophen, NSAIDS)     Tylenol 9. NEUROLOGIC SYMPTOMS: "Do you have any weakness, numbness, or problems with bowel/bladder control?"     No 10. OTHER SYMPTOMS: "Do you have any other symptoms?" (e.g., fever, abdominal pain, burning with urination, blood in urine)       No 11. PREGNANCY: "Is there any chance you are pregnant?" (e.g., yes, no; LMP)       No  Protocols used: BACK PAIN-A-AH

## 2020-01-19 NOTE — Patient Instructions (Signed)

## 2020-01-20 DIAGNOSIS — R109 Unspecified abdominal pain: Secondary | ICD-10-CM | POA: Diagnosis not present

## 2020-01-21 LAB — URINALYSIS, MICROSCOPIC ONLY
Bacteria, UA: NONE SEEN
Casts: NONE SEEN /lpf
Epithelial Cells (non renal): NONE SEEN /hpf (ref 0–10)
RBC, Urine: NONE SEEN /hpf (ref 0–2)
WBC, UA: NONE SEEN /hpf (ref 0–5)

## 2020-01-22 ENCOUNTER — Telehealth: Payer: Self-pay

## 2020-01-22 NOTE — Telephone Encounter (Signed)
-----   Message from Mar Daring, PA-C sent at 01/22/2020  8:23 AM EDT ----- Urine microanalysis was normal which is reassuring.

## 2020-01-22 NOTE — Telephone Encounter (Signed)
Patient advised as directed below. 

## 2020-01-29 ENCOUNTER — Other Ambulatory Visit: Payer: Self-pay | Admitting: Family Medicine

## 2020-03-16 ENCOUNTER — Other Ambulatory Visit: Payer: Self-pay | Admitting: Family Medicine

## 2020-06-02 NOTE — Progress Notes (Signed)
Subjective:   Melinda Mack is a 69 y.o. female who presents for Medicare Annual (Subsequent) preventive examination.  I connected with Melinda Mack today by telephone and verified that I am speaking with the correct person using two identifiers. Location patient: home Location provider: work Persons participating in the virtual visit: patient, provider.   I discussed the limitations, risks, security and privacy concerns of performing an evaluation and management service by telephone and the availability of in person appointments. I also discussed with the patient that there may be a patient responsible charge related to this service. The patient expressed understanding and verbally consented to this telephonic visit.    Interactive audio and video telecommunications were attempted between this provider and patient, however failed, due to patient having technical difficulties OR patient did not have access to video capability.  We continued and completed visit with audio only.  Review of Systems    N/A  Cardiac Risk Factors include: advanced age (>44men, >84 women);dyslipidemia     Objective:    There were no vitals filed for this visit. There is no height or weight on file to calculate BMI.  Advanced Directives 06/03/2020 06/03/2019 05/24/2018 04/24/2017 07/05/2015  Does Patient Have a Medical Advance Directive? Yes Yes No No Yes  Type of Paramedic of [place];Living will Living will;Healthcare Power of Attorney - - -  Copy of [place] in Chart? No - copy requested No - copy requested - - No - copy requested  Would patient like information on creating a medical advance directive? - - Yes (MAU/Ambulatory/Procedural Areas - Information given) Yes (ED - Information included in AVS) -    Current Medications (verified) Outpatient Encounter Medications as of 06/03/2020  Medication Sig  . Aspirin 81 MG EC tablet 1 tablet every other day.     Arna Medici 50 MCG tablet Take 1 tablet by mouth once daily  . lovastatin (MEVACOR) 40 MG tablet TAKE 1 TABLET BY MOUTH AT BEDTIME (VISIT  NEEDED  FOR  ADDITIONAL  REFILLS)  . Multiple Vitamin (MULTIVITAMIN) capsule Take 1 capsule by mouth daily.  Marland Kitchen omeprazole (PRILOSEC) 20 MG capsule Take 1 capsule (20 mg total) by mouth daily. (Patient not taking: Reported on 01/19/2020)   No facility-administered encounter medications on file as of 06/03/2020.    Allergies (verified) Codeine, Penicillins, Tetracycline, and Sulfa antibiotics   History: Past Medical History:  Diagnosis Date  . 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 History of chicken pox   . History of measles   . History of mumps   . Hypercholesterolemia   . Hypothyroidism 2013   Past Surgical History:  Procedure Laterality Date  . ABDOMINAL HYSTERECTOMY  04/18/2012   [facility] at [facility]  . BILATERAL SALPINGOOPHORECTOMY  03/2012  . BREAST BIOPSY Left 05/08/2016   neg- [place]  . BREAST BIOPSY Right 10/2015   neg- [place]  . LYMPHADENECTOMY  03/2012  . 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 TONSILLECTOMY AND ADENOIDECTOMY  1970  . TUBAL LIGATION  1978   Family History  Problem Relation Age of Onset  . Lung disease Mother   . COPD Mother   . Hypertension Mother   . Heart disease Mother   . Lung disease Father   . Healthy Sister   . Healthy Sister   . Heart attack Maternal Grandmother   . Alzheimer's disease Paternal Grandmother   . Coronary artery disease Other   . Hypertension Maternal  Uncle   . Breast cancer Paternal Aunt 79  . Lung cancer Maternal Uncle 71   Social History   Socioeconomic History  . Marital status: Widowed    Spouse name: Not on file  . Number of children: 3  . Years of education: 73  . Highest education level: 12th grade  Occupational History    Comment: [employer]  Tobacco Use  . Smoking status: Former Smoker    Packs/day: 0.50    Years: 9.00    Pack years: 4.50    Types:  Cigarettes    Quit date: 04/26/1992    Years since quitting: 28.1  . Smokeless tobacco: Never Used  . Tobacco comment: QUIT IN 1996  Vaping Use  . Vaping Use: Never used  Substance and Sexual Activity  . Alcohol use: No    Alcohol/week: 0.0 standard drinks  . Drug use: No  . Sexual activity: Not on file  Other Topics Concern  . Not on file  Social History Narrative   Widowed   Caffeine use- coffee - 2 cups daily   Social Determinants of Health   Financial Resource Strain: Low Risk   . Difficulty of Paying Living Expenses: Not hard at all  Food Insecurity: No Food Insecurity  . Worried About Charity fundraiser in the Last Year: Never true  . Ran Out of Food in the Last Year: Never true  Transportation Needs: No Transportation Needs  . Lack of Transportation (Medical): No  . Lack of Transportation (Non-Medical): No  Physical Activity: Inactive  . Days of Exercise per Week: 0 days  . Minutes of Exercise per Session: 0 min  Stress: No Stress Concern Present  . Feeling of Stress : Not at all  Social Connections: Moderately Integrated  . Frequency of Communication with Friends and Family: More than three times a week  . Frequency of Social Gatherings with Friends and Family: More than three times a week  . Attends Religious Services: More than 4 times per year  . Active Member of Clubs or Organizations: Yes  . Attends Archivist Meetings: More than 4 times per year  . Marital Status: Widowed    Tobacco Counseling Counseling given: Not Answered Comment: QUIT IN 1996   Clinical Intake:  Pre-visit preparation completed: Yes  Pain : No/denies pain     Nutritional Risks: None Diabetes: No  How often do you need to have someone help you when you read instructions, pamphlets, or other written materials from your doctor or pharmacy?: 1 - Never  Diabetic? No  Interpreter Needed?: No  Information entered by :: [facility], LPN   Activities of Daily Living In  your present state of health, do you have any difficulty performing the following activities: 06/03/2020  Hearing? N  Vision? N  Difficulty concentrating or making decisions? N  Walking or climbing stairs? N  Dressing or bathing? N  Doing errands, shopping? N  Preparing Food and eating ? N  Using the Toilet? N  In the past six months, have you accidently leaked urine? N  Do you have problems with loss of bowel control? N  Managing your Medications? N  Managing your Finances? N  Housekeeping or managing your Housekeeping? N  Some recent data might be hidden    Patient Care Team: Birdie Sons, MD as PCP - General (Family Medicine) Anell Barr, OD as Consulting Physician (Optometry)  Indicate any recent Medical Services you may have received from other than [facility]  providers in the past year (date may be approximate).     Assessment:   This is a routine wellness examination for Melinda Mack.  Hearing/Vision screen No exam data present  Dietary issues and exercise activities discussed: Current Exercise Habits: The patient does not participate in regular exercise at present, Exercise limited by: None identified  Goals    . Cut out extra servings     Recommend cutting out junk food and sweets in daily diet. Pt to focus on protein snacks in between meals.     Marland Kitchen DIET - REDUCE SUGAR INTAKE     Recommend cutting out all desserts and junk food and substituting for fruits.       Depression Screen PHQ 2/9 Scores 06/03/2020 06/03/2019 05/24/2018 03/14/2018 04/24/2017 04/17/2017  PHQ - 2 Score 0 1 1 1 2 1   PHQ- 9 Score - - - - 8 -    Fall Risk Fall Risk  06/03/2020 12/24/2019 06/03/2019 05/24/2018 04/24/2017  Falls in the past year? 0 0 0 No No  Number falls in past yr: 0 0 - - -  Injury with Fall? 0 0 - - -    Any stairs in or around the home? No  If so, are there any without handrails? No  Home free of loose throw rugs in walkways, pet beds, electrical cords, etc? Yes  Adequate lighting  in your home to reduce risk of falls? Yes   ASSISTIVE DEVICES UTILIZED TO PREVENT FALLS:  Life alert? No  Use of a cane, walker or w/c? No  Grab bars in the bathroom? No  Shower chair or bench in shower? No  Elevated toilet seat or a handicapped toilet? No    Cognitive Function: Declined today.     6CIT Screen 04/24/2017  What Year? 0 points  What month? 0 points  What time? 0 points  Count back from 20 0 points  Months in reverse 0 points  Repeat phrase 0 points  Total Score 0    Immunizations Immunization History  Administered Date(s) Administered  . Fluad Quad(high Dose 65+) 12/24/2019  . Influenza, High Dose Seasonal PF 07/24/2018  . Influenza,inj,Quad PF,6+ Mos 06/16/2015, 08/01/2016  . Influenza-Unspecified 08/18/2017  . Pneumococcal Conjugate-13 12/06/2016  . Pneumococcal Polysaccharide-23 07/24/2018  . Tdap 01/21/2009  . Zoster 08/28/2011    TDAP status: Due, Education has been provided regarding the importance of this vaccine. Advised may receive this vaccine at local pharmacy or Health Dept. Aware to provide a copy of the vaccination record if obtained from local pharmacy or Health Dept. Verbalized acceptance and understanding. Flu Vaccine status: Up to date Pneumococcal vaccine status: Up to date Covid-19 vaccine status: Declined, Education has been provided regarding the importance of this vaccine but patient still declined. Advised may receive this vaccine at local pharmacy or Health Dept.or vaccine clinic. Aware to provide a copy of the vaccination record if obtained from local pharmacy or Health Dept. Verbalized acceptance and understanding.  Qualifies for Shingles Vaccine? Yes   Zostavax completed Yes   Shingrix Completed?: No.    Education has been provided regarding the importance of this vaccine. Patient has been advised to call insurance company to determine out of pocket expense if they have not yet received this vaccine. Advised may also receive vaccine  at local pharmacy or Health Dept. Verbalized acceptance and understanding.  Screening Tests Health Maintenance  Topic Date Due  . MAMMOGRAM  11/17/2017  . DEXA SCAN  01/31/2020  . INFLUENZA VACCINE  05/23/2020  .  COVID-19 Vaccine (1) 06/19/2020 (Originally 04/14/1963)  . TETANUS/TDAP  06/03/2021 (Originally 01/22/2019)  . Fecal DNA (Cologuard)  03/27/2021  . Hepatitis C Screening  Completed  . PNA vac Low Risk Adult  Completed    Health Maintenance  Health Maintenance Due  Topic Date Due  . MAMMOGRAM  11/17/2017  . DEXA SCAN  01/31/2020  . INFLUENZA VACCINE  05/23/2020    Colorectal cancer screening: Cologuard completed 03/27/18. Repeat every 3 years Mammogram status: Ordered today. Pt provided with contact info and advised to call to schedule appt.  Bone Density status: Ordered today. Pt provided with contact info and advised to call to schedule appt.  Lung Cancer Screening: (Low Dose CT Chest recommended if Age 75-80 years, 30 pack-year currently smoking OR have quit w/in 15years.) does not qualify.   Additional Screening:  Hepatitis C Screening: Up to date  Vision Screening: Recommended annual ophthalmology exams for early detection of glaucoma and other disorders of the eye. Is the patient up to date with their annual eye exam?  Yes  Who is the provider or what is the name of the office in which the patient attends annual eye exams? Dr Ellin Mayhew If pt is not established with a provider, would they like to be referred to a provider to establish care? No .   Dental Screening: Recommended annual dental exams for proper oral hygiene  Community Resource Referral / Chronic Care Management: CRR required this visit?  No   CCM required this visit?  No      Plan:     I have personally reviewed and noted the following in the patient's chart:   . Medical and social history . Use of alcohol, tobacco or illicit drugs  . Current medications and supplements . Functional ability  and status . Nutritional status . Physical activity . Advanced directives . List of other physicians . Hospitalizations, surgeries, and ER visits in previous 12 months . Vitals . Screenings to include cognitive, depression, and falls . Referrals and appointments  In addition, I have reviewed and discussed with patient certain preventive protocols, quality metrics, and best practice recommendations. A written personalized care plan for preventive services as well as general preventive health recommendations were provided to patient.     Shanigua Gibb [place], [place]   01/31/3012   Nurse Notes: None.

## 2020-06-03 ENCOUNTER — Ambulatory Visit (INDEPENDENT_AMBULATORY_CARE_PROVIDER_SITE_OTHER): Payer: PPO

## 2020-06-03 ENCOUNTER — Other Ambulatory Visit: Payer: Self-pay

## 2020-06-03 DIAGNOSIS — Z0000 Encounter for general adult medical examination without abnormal findings: Secondary | ICD-10-CM | POA: Diagnosis not present

## 2020-06-03 DIAGNOSIS — E2839 Other primary ovarian failure: Secondary | ICD-10-CM | POA: Diagnosis not present

## 2020-06-03 DIAGNOSIS — Z1231 Encounter for screening mammogram for malignant neoplasm of breast: Secondary | ICD-10-CM | POA: Diagnosis not present

## 2020-06-03 NOTE — Patient Instructions (Signed)
Melinda Mack , Thank you for taking time to come for your Medicare Wellness Visit. I appreciate your ongoing commitment to your health goals. Please review the following plan we discussed and let me know if I can assist you in the future.   Screening recommendations/referrals: Colonoscopy: Cologuard up to date, due 03/27/21 Mammogram: Ordered today. Advised pt to call to schedule apt. Bone Density: Ordered today. Pt aware the office will call WU:[id number]. Recommended yearly ophthalmology/optometry visit for glaucoma screening and checkup Recommended yearly dental visit for hygiene and checkup  Vaccinations: Influenza vaccine: Due fall 2021 Pneumococcal vaccine: Completed series Tdap vaccine: Currently due, declined at this time. Shingles vaccine: Shingrix discussed. Please contact your pharmacy for coverage information.     Advanced directives: Please bring a copy of your POA (Power of Attorney) and/or Living Will to your next appointment.   Conditions/risks identified: Recommend to continue to cut back on junk foods and increase fruits in diet.   Next appointment: 06/09/21 @ 9:00 AM for repeat AWV.    Preventive Care 38 Years and Older, Female Preventive care refers to lifestyle choices and visits with your health care provider that can promote health and wellness. What does preventive care include?  A yearly physical exam. This is also called an annual well check.  Dental exams once or twice a year.  Routine eye exams. Ask your health care provider how often you should have your eyes checked.  Personal lifestyle choices, including:  Daily care of your teeth and gums.  Regular physical activity.  Eating a healthy diet.  Avoiding tobacco and drug use.  Limiting alcohol use.  Practicing safe sex.  Taking low-dose aspirin every day.  Taking vitamin and mineral supplements as recommended by your health care provider. What happens during an annual well check? The services and  screenings done by your health care provider during your annual well check will depend on your age, overall health, lifestyle risk factors, and family history of disease. Counseling  Your health care provider may ask you questions about your:  Alcohol use.  Tobacco use.  Drug use.  Emotional well-being.  Home and relationship well-being.  Sexual activity.  Eating habits.  History of falls.  Memory and ability to understand (cognition).  Work and work Statistician.  Reproductive health. Screening  You may have the following tests or measurements:  Height, weight, and BMI.  Blood pressure.  Lipid and cholesterol levels. These may be checked every 5 years, or more frequently if you are over 76 years old.  Skin check.  Lung cancer screening. You may have this screening every year starting at age 39 if you have a 30-pack-year history of smoking and currently smoke or have quit within the past 15 years.  Fecal occult blood test (FOBT) of the stool. You may have this test every year starting at age 54.  Flexible sigmoidoscopy or colonoscopy. You may have a sigmoidoscopy every 5 years or a colonoscopy every 10 years starting at age 46.  Hepatitis C blood test.  Hepatitis B blood test.  Sexually transmitted disease (STD) testing.  Diabetes screening. This is done by checking your blood sugar (glucose) after you have not eaten for a while (fasting). You may have this done every 1-3 years.  Bone density scan. This is done to screen for osteoporosis. You may have this done starting at age 53.  Mammogram. This may be done every 1-2 years. Talk to your health care provider about how often you should have regular  mammograms. Talk with your health care provider about your test results, treatment options, and if necessary, the need for more tests. Vaccines  Your health care provider may recommend certain vaccines, such as:  Influenza vaccine. This is recommended every  year.  Tetanus, diphtheria, and acellular pertussis (Tdap, Td) vaccine. You may need a Td booster every 10 years.  Zoster vaccine. You may need this after age 85.  Pneumococcal 13-valent conjugate (PCV13) vaccine. One dose is recommended after age 48.  Pneumococcal polysaccharide (PPSV23) vaccine. One dose is recommended after age 40. Talk to your health care provider about which screenings and vaccines you need and how often you need them. This information is not intended to replace advice given to you by your health care provider. Make sure you discuss any questions you have with your health care provider. Document Released: 11/05/2015 Document Revised: 06/28/2016 Document Reviewed: 08/10/2015 Elsevier Interactive Patient Education  2017 [place] Prevention in the Home Falls can cause injuries. They can happen to people of all ages. There are many things you can do to make your home safe and to help prevent falls. What can I do on the outside of my home?  Regularly fix the edges of walkways and driveways and fix any cracks.  Remove anything that might make you trip as you walk through a door, such as a raised step or threshold.  Trim any bushes or trees on the path to your home.  Use bright outdoor lighting.  Clear any walking paths of anything that might make someone trip, such as rocks or tools.  Regularly check to see if handrails are loose or broken. Make sure that both sides of any steps have handrails.  Any raised decks and porches should have guardrails on the edges.  Have any leaves, snow, or ice cleared regularly.  Use sand or salt on walking paths during winter.  Clean up any spills in your garage right away. This includes oil or grease spills. What can I do in the bathroom?  Use night lights.  Install grab bars by the toilet and in the tub and shower. Do not use towel bars as grab bars.  Use non-skid mats or decals in the tub or shower.  If you  need to sit down in the shower, use a plastic, non-slip stool.  Keep the floor dry. Clean up any water that spills on the floor as soon as it happens.  Remove soap buildup in the tub or shower regularly.  Attach bath mats securely with double-sided non-slip rug tape.  Do not have throw rugs and other things on the floor that can make you trip. What can I do in the bedroom?  Use night lights.  Make sure that you have a light by your bed that is easy to reach.  Do not use any sheets or blankets that are too big for your bed. They should not hang down onto the floor.  Have a firm chair that has side arms. You can use this for support while you get dressed.  Do not have throw rugs and other things on the floor that can make you trip. What can I do in the kitchen?  Clean up any spills right away.  Avoid walking on wet floors.  Keep items that you use a lot in easy-to-reach places.  If you need to reach something above you, use a strong step stool that has a grab bar.  Keep electrical cords out of the way.  Do not use floor polish or wax that makes floors slippery. If you must use wax, use non-skid floor wax.  Do not have throw rugs and other things on the floor that can make you trip. What can I do with my stairs?  Do not leave any items on the stairs.  Make sure that there are handrails on both sides of the stairs and use them. Fix handrails that are broken or loose. Make sure that handrails are as long as the stairways.  Check any carpeting to make sure that it is firmly attached to the stairs. Fix any carpet that is loose or worn.  Avoid having throw rugs at the top or bottom of the stairs. If you do have throw rugs, attach them to the floor with carpet tape.  Make sure that you have a light switch at the top of the stairs and the bottom of the stairs. If you do not have them, ask someone to add them for you. What else can I do to help prevent falls?  Wear shoes  that:  Do not have high heels.  Have rubber bottoms.  Are comfortable and fit you well.  Are closed at the toe. Do not wear sandals.  If you use a stepladder:  Make sure that it is fully opened. Do not climb a closed stepladder.  Make sure that both sides of the stepladder are locked into place.  Ask someone to hold it for you, if possible.  Clearly mark and make sure that you can see:  Any grab bars or handrails.  First and last steps.  Where the edge of each step is.  Use tools that help you move around (mobility aids) if they are needed. These include:  Canes.  Walkers.  Scooters.  Crutches.  Turn on the lights when you go into a dark area. Replace any light bulbs as soon as they burn out.  Set up your furniture so you have a clear path. Avoid moving your furniture around.  If any of your floors are uneven, fix them.  If there are any pets around you, be aware of where they are.  Review your medicines with your doctor. Some medicines can make you feel dizzy. This can increase your chance of falling. Ask your doctor what other things that you can do to help prevent falls. This information is not intended to replace advice given to you by your health care provider. Make sure you discuss any questions you have with your health care provider. Document Released: 08/05/2009 Document Revised: 03/16/2016 Document Reviewed: 11/13/2014 Elsevier Interactive Patient Education  2017 [employer].

## 2020-06-14 ENCOUNTER — Other Ambulatory Visit: Payer: Self-pay | Admitting: Family Medicine

## 2020-08-18 ENCOUNTER — Ambulatory Visit
Admission: RE | Admit: 2020-08-18 | Discharge: 2020-08-18 | Disposition: A | Discharge status: Home / Self Care | Payer: PPO | Source: Ambulatory Visit | Attending: Family Medicine | Admitting: Family Medicine

## 2020-08-18 ENCOUNTER — Other Ambulatory Visit: Payer: Self-pay

## 2020-08-18 DIAGNOSIS — Z90710 Acquired absence of both cervix and uterus: Secondary | ICD-10-CM | POA: Diagnosis not present

## 2020-08-18 DIAGNOSIS — Z780 Asymptomatic menopausal state: Secondary | ICD-10-CM | POA: Diagnosis not present

## 2020-08-18 DIAGNOSIS — Z1231 Encounter for screening mammogram for malignant neoplasm of breast: Secondary | ICD-10-CM | POA: Insufficient documentation

## 2020-08-18 DIAGNOSIS — Z90722 Acquired absence of ovaries, bilateral: Secondary | ICD-10-CM | POA: Diagnosis not present

## 2020-08-18 DIAGNOSIS — M8589 Other specified disorders of bone density and structure, multiple sites: Secondary | ICD-10-CM | POA: Diagnosis not present

## 2020-08-18 DIAGNOSIS — E2839 Other primary ovarian failure: Secondary | ICD-10-CM | POA: Diagnosis not present

## 2020-09-10 ENCOUNTER — Other Ambulatory Visit: Payer: Self-pay | Admitting: Family Medicine

## 2020-10-18 DIAGNOSIS — H2513 Age-related nuclear cataract, bilateral: Secondary | ICD-10-CM | POA: Diagnosis not present

## 2020-10-18 DIAGNOSIS — H40013 Open angle with borderline findings, low risk, bilateral: Secondary | ICD-10-CM | POA: Diagnosis not present

## 2020-10-18 DIAGNOSIS — H43819 Vitreous degeneration, unspecified eye: Secondary | ICD-10-CM | POA: Diagnosis not present

## 2020-12-10 ENCOUNTER — Other Ambulatory Visit: Payer: Self-pay | Admitting: Family Medicine

## 2021-02-11 IMAGING — MG DIGITAL SCREENING BILAT W/ TOMO W/ CAD
6 of 10 series · 6 of 30 positions shown · non-contrast
Comparison: Previous exam(s).

CLINICAL DATA: Screening.

EXAM:
DIGITAL SCREENING BILATERAL MAMMOGRAM WITH TOMO AND CAD

[L MLO synth-2D]
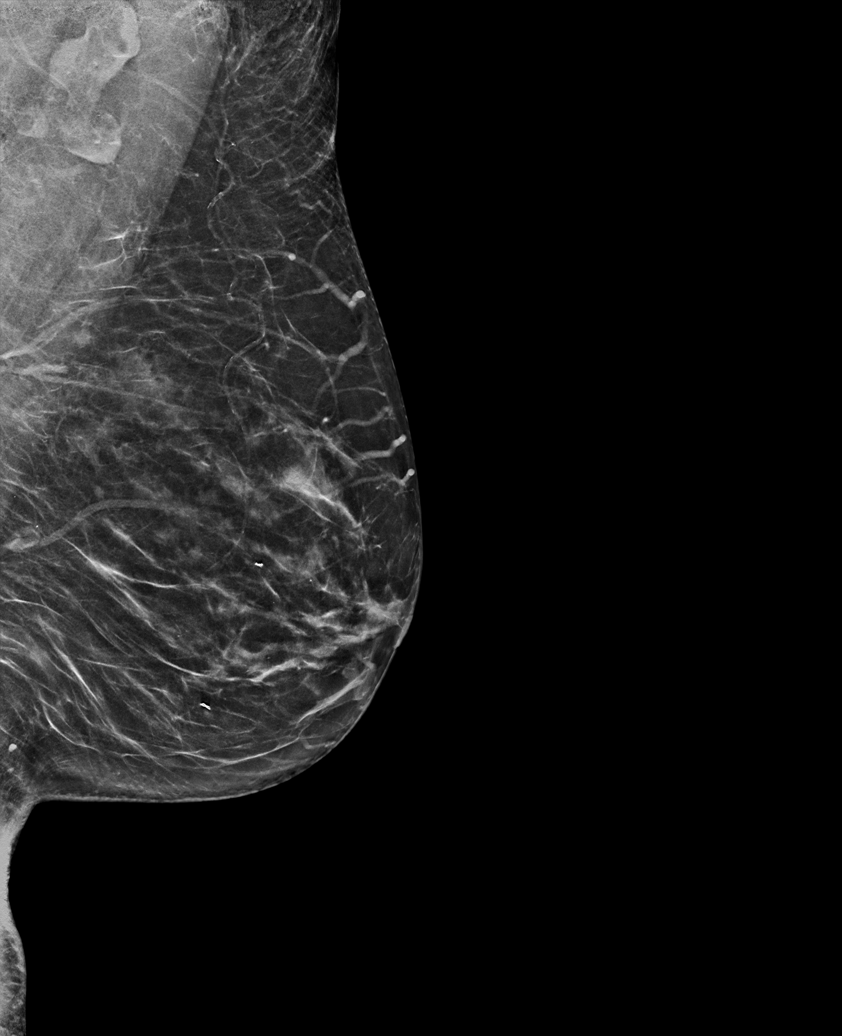

[R XCCL synth-2D]
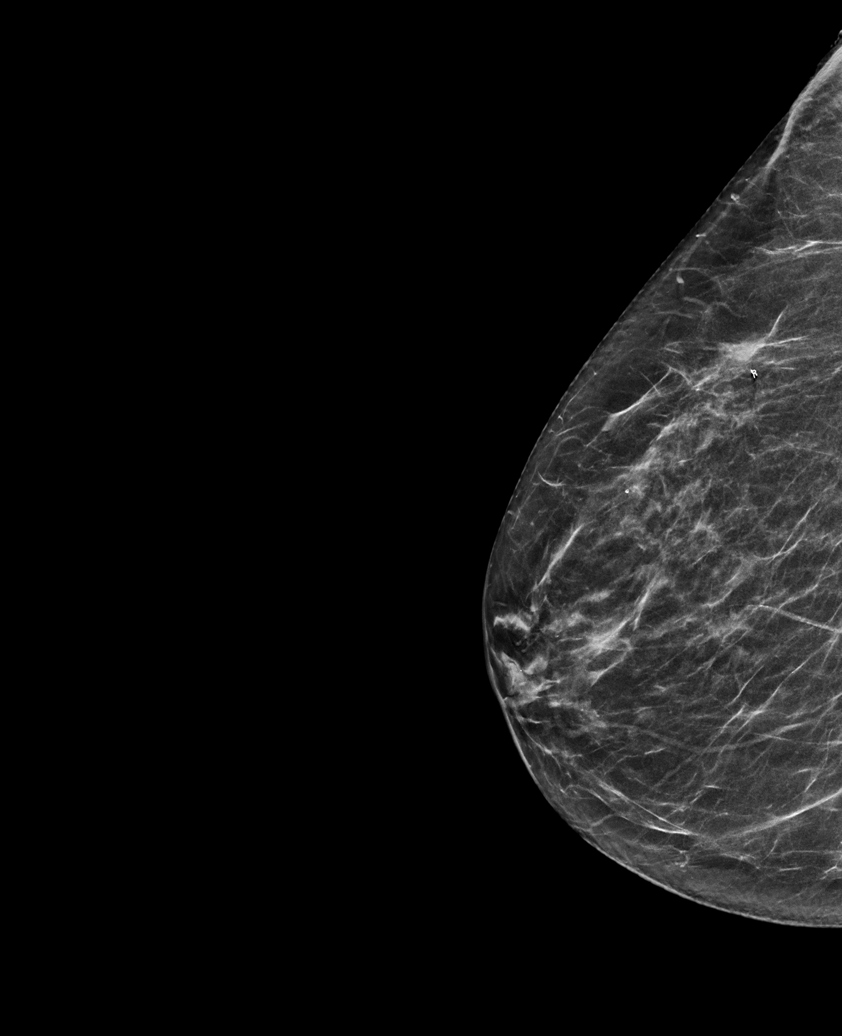

[R CC synth-2D]
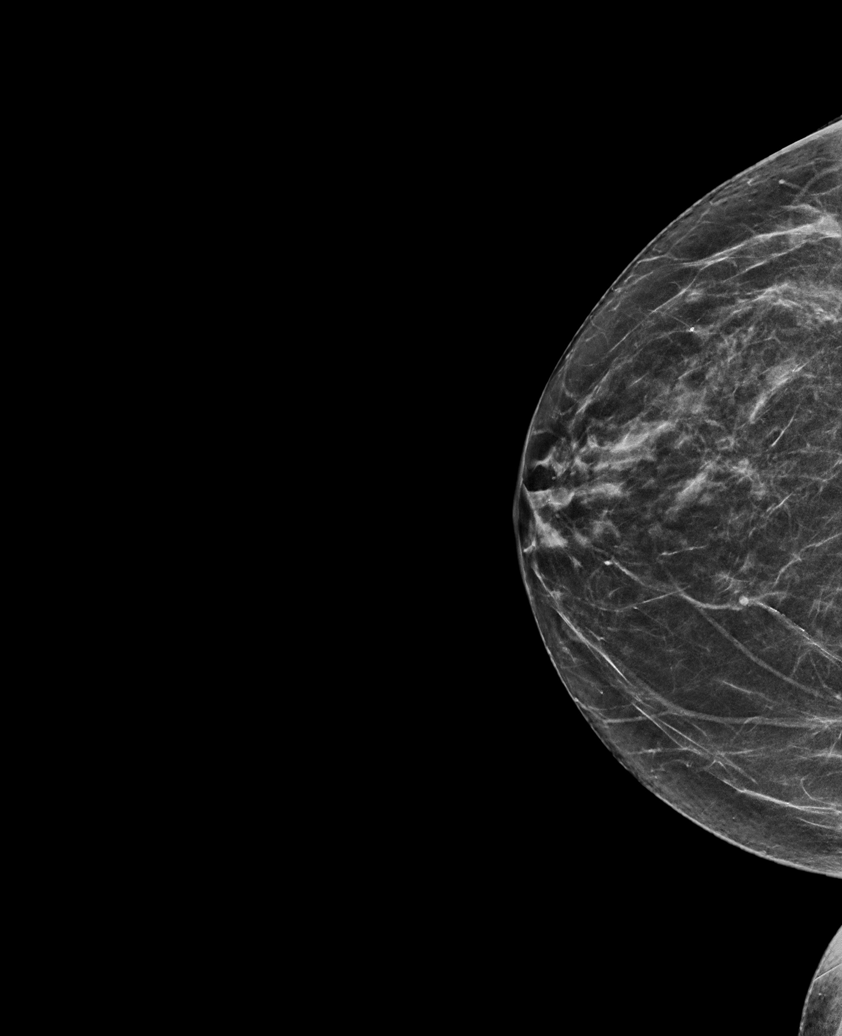

[L CC synth-2D]
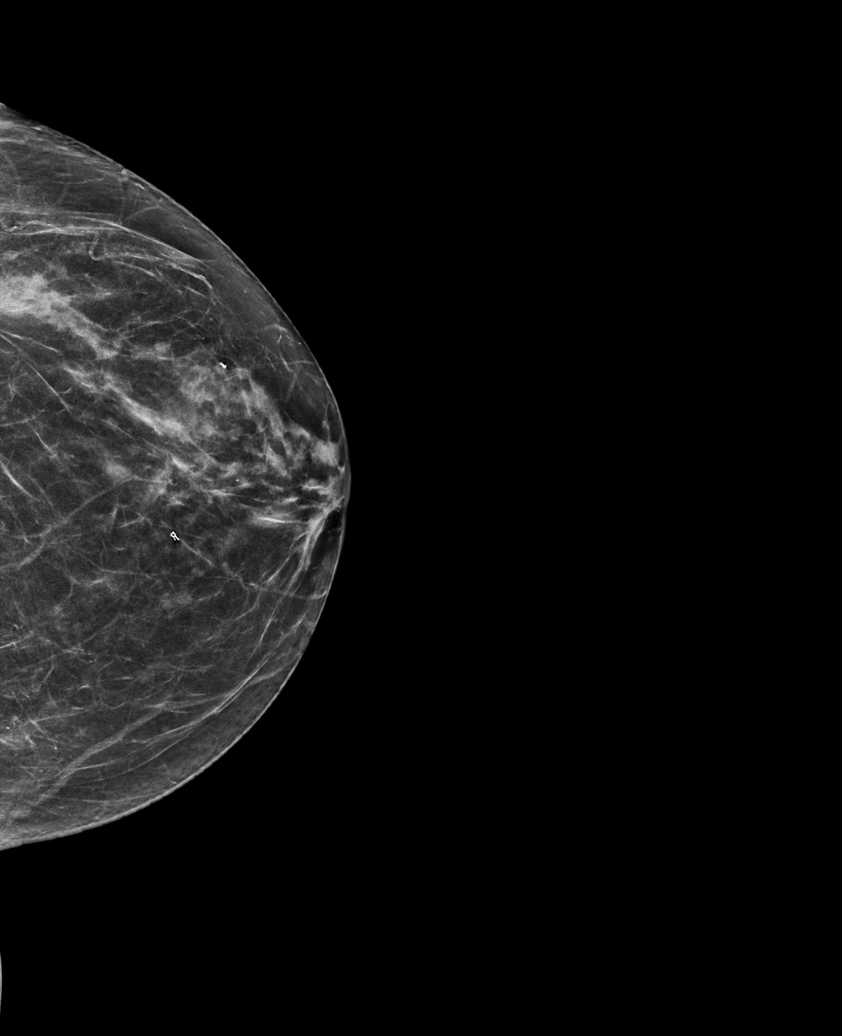

[R MLO synth-2D]
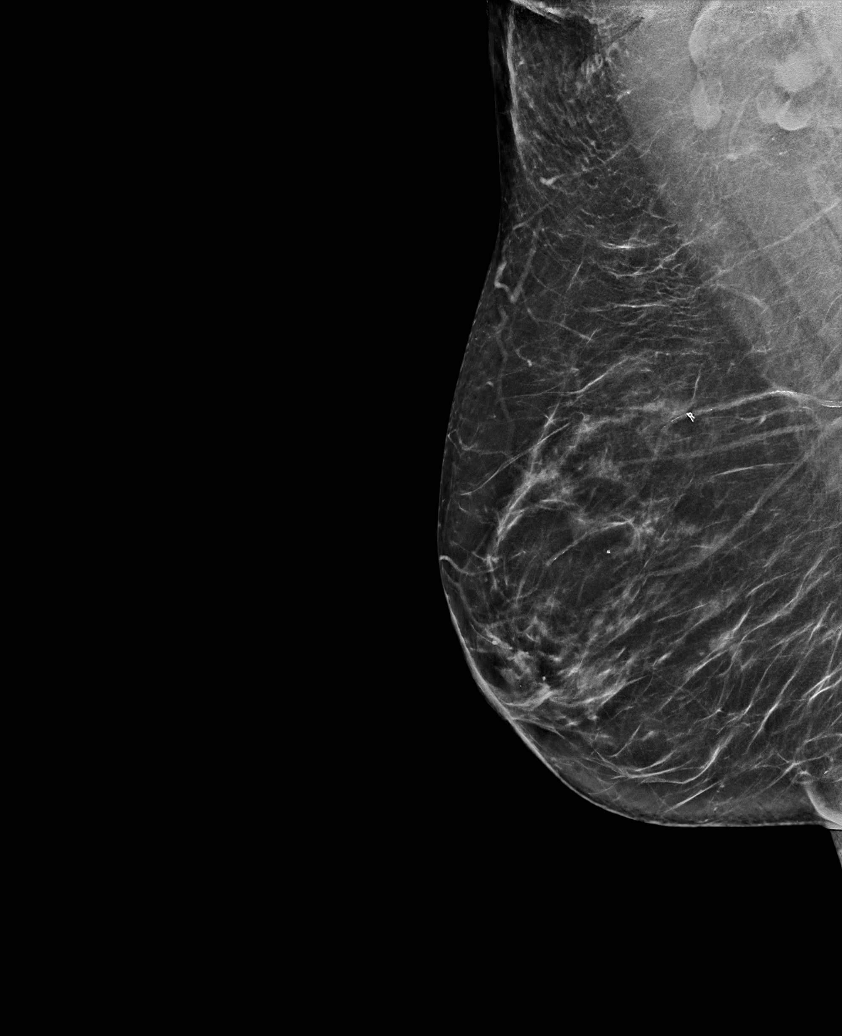

[R CC tomo · tomo slice 29/56.0]
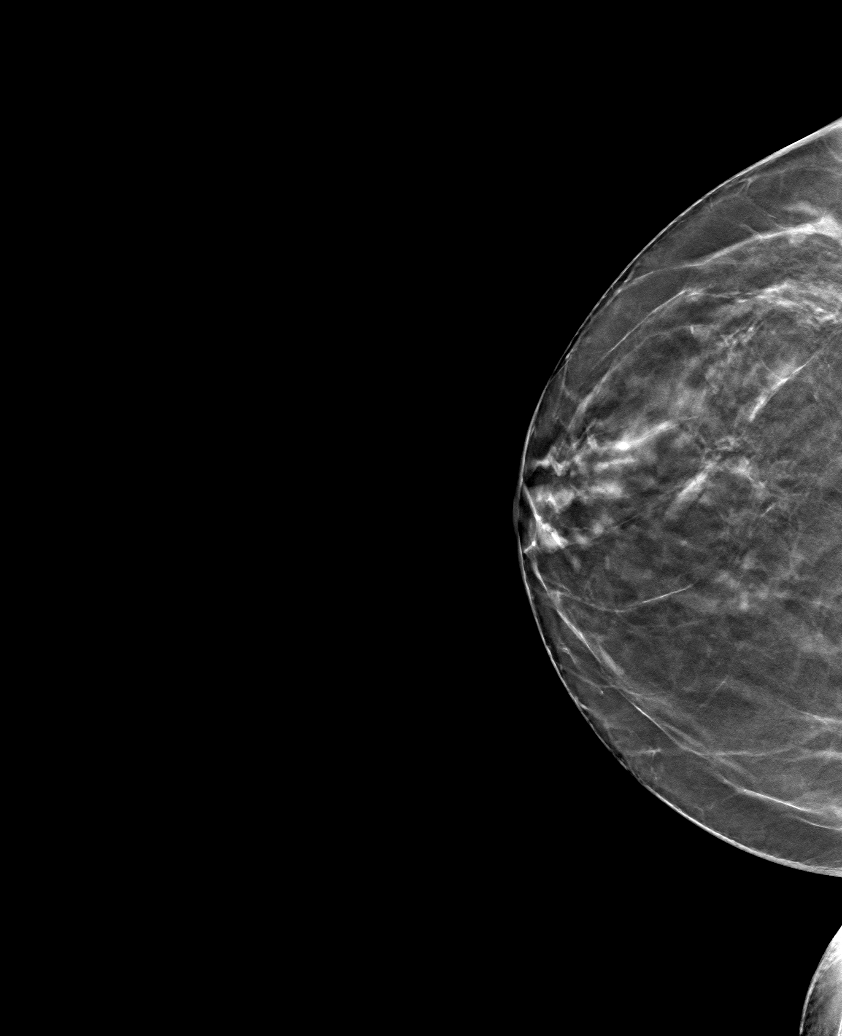

[6 of 30 positions shown; findings below may reference images not displayed]

ACR Breast Density Category b: There are scattered areas of
fibroglandular density.
FINDINGS: There are no findings suspicious for malignancy. Images were
processed with CAD.
IMPRESSION: No mammographic evidence of malignancy. A result letter of this
screening mammogram will be mailed directly to the patient.

RECOMMENDATION:
Screening mammogram in one year. (Code:[id number])

BI-RADS CATEGORY  1: Negative.

## 2021-02-14 ENCOUNTER — Other Ambulatory Visit: Payer: Self-pay | Admitting: Family Medicine

## 2021-02-14 NOTE — Telephone Encounter (Signed)
Requested medication (s) are due for refill today: expired medication  Requested medication (s) are on the active medication list: yes  Last refill:  01/29/20 #90 3 refills  Future visit scheduled: no  Notes to clinic:  expired medication, called patient to schedule future appt. No answer, LVMTCB. Do you want 2nd courtesy refill Rx?     Requested Prescriptions  Pending Prescriptions Disp Refills   lovastatin (MEVACOR) 40 MG tablet [Pharmacy Med Name: Lovastatin 40 MG Oral Tablet] 90 tablet 0    Sig: TAKE 1 TABLET BY MOUTH AT BEDTIME (VISIT  NEEDED  FOR  ADDITIONAL  REFILLS)      Cardiovascular:  Antilipid - Statins Failed - 02/14/2021  3:20 PM      Failed - Total Cholesterol in normal range and within 360 days    Cholesterol, Total  Date Value Ref Range Status  12/24/2019 179 100 - 199 mg/dL Final          Failed - LDL in normal range and within 360 days    LDL Chol Calc (NIH)  Date Value Ref Range Status  12/24/2019 92 0 - 99 mg/dL Final          Failed - HDL in normal range and within 360 days    HDL  Date Value Ref Range Status  12/24/2019 45 >39 mg/dL Final          Failed - Triglycerides in normal range and within 360 days    Triglycerides  Date Value Ref Range Status  12/24/2019 249 (H) 0 - 149 mg/dL Final          Failed - Valid encounter within last 12 months    Recent Outpatient Visits           1 year ago Trigger point   [place], Clearnce Sorrel, [place]   1 year ago Annual physical exam   [facility] Birdie Sons, MD   1 year ago Stuffy nose   [place], Vickki Muff, PA-C   2 years ago Chronic heel pain, right   [employer], Vickki Muff, PA-C   2 years ago Annual physical exam   [place], Kirstie Peri, MD                Passed - Patient is not pregnant

## 2021-02-21 ENCOUNTER — Encounter: Payer: Self-pay | Admitting: Family Medicine

## 2021-02-21 ENCOUNTER — Other Ambulatory Visit: Payer: Self-pay

## 2021-02-21 ENCOUNTER — Ambulatory Visit (INDEPENDENT_AMBULATORY_CARE_PROVIDER_SITE_OTHER): Payer: PPO | Admitting: Family Medicine

## 2021-02-21 VITALS — BP 138/76 | HR 75 | Wt 180.0 lb

## 2021-02-21 DIAGNOSIS — E039 Hypothyroidism, unspecified: Secondary | ICD-10-CM

## 2021-02-21 DIAGNOSIS — Z23 Encounter for immunization: Secondary | ICD-10-CM

## 2021-02-21 DIAGNOSIS — Z0000 Encounter for general adult medical examination without abnormal findings: Secondary | ICD-10-CM

## 2021-02-21 DIAGNOSIS — E7800 Pure hypercholesterolemia, unspecified: Secondary | ICD-10-CM | POA: Diagnosis not present

## 2021-02-21 DIAGNOSIS — Z289 Immunization not carried out for unspecified reason: Secondary | ICD-10-CM | POA: Diagnosis not present

## 2021-02-21 MED ORDER — TETANUS-DIPHTH-ACELL PERTUSSIS 5-2.5-18.5 LF-MCG/0.5 IM SUSY: 0.5000 mL | PREFILLED_SYRINGE | Freq: Once | INTRAMUSCULAR | 0 refills | Status: AC

## 2021-02-21 MED ORDER — SHINGRIX 50 MCG/0.5ML IM SUSR: 0.5000 mL | Freq: Once | INTRAMUSCULAR | 0 refills | Status: AC

## 2021-02-21 NOTE — Progress Notes (Signed)
Complete physical exam   Patient: Melinda Mack   DOB: 1951/08/07   70 y.o. Female  MRN: [id number] Visit Date: 02/21/2021  Today's healthcare provider: Lelon Huh, MD   Chief Complaint  Patient presents with  . Hypothyroidism  . Hyperlipidemia  . Annual Exam   Subjective    Melinda Mack is a 70 y.o. female who presents today for a complete physical exam.  She reports consuming a general diet.  She generally feels well. She reports sleeping fairly well. She does not have additional problems to discuss today.  HPI   Hypothyroid, follow-up  Lab Results  Component Value Date   TSH 1.770 12/24/2019   TSH 1.930 03/14/2018   TSH 2.350 12/08/2016   FREET4 1.42 12/24/2019   T4TOTAL 9.3 12/08/2016   T4TOTAL 9.8 06/16/2015   Wt Readings from Last 3 Encounters:  02/21/21 180 lb (81.6 kg)  01/19/20 174 lb 6.4 oz (79.1 kg)  12/24/19 175 lb 12.8 oz (79.7 kg)    She was last seen for hypothyroid 13 months ago.  Management since that visit includes continue current medication. She reports good compliance with treatment. She is not having side effects.   Symptoms: No change in energy level No constipation  No diarrhea No heat / cold intolerance  No nervousness No palpitations  No weight changes    ----------------------------------------------------------------------------------------- Lipid/Cholesterol, Follow-up  Last lipid panel Other pertinent labs  Lab Results  Component Value Date   CHOL 179 12/24/2019   HDL 45 12/24/2019   LDLCALC 92 12/24/2019   TRIG 249 (H) 12/24/2019   CHOLHDL 4.0 12/24/2019   Lab Results  Component Value Date   ALT 20 12/24/2019   AST 21 12/24/2019   PLT 303 12/24/2019   TSH 1.770 12/24/2019     She was last seen for this 13 months ago.  Management since that visit includes continue current medication.  She reports good compliance with treatment. She is not having side effects.   Symptoms: No chest pain No  chest pressure/discomfort  No dyspnea No lower extremity edema  No numbness or tingling of extremity No orthopnea  No palpitations No paroxysmal nocturnal dyspnea  No speech difficulty No syncope   Current diet: well balanced, low salt Current exercise: housecleaning and walking  The 10-year ASCVD risk score Mikey Bussing DC Brooke Bonito., et al., 2013) is: 9.9%   Past Medical History:  Diagnosis Date  . 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 History of chicken pox   . History of measles   . History of mumps    Past Surgical History:  Procedure Laterality Date  . ABDOMINAL HYSTERECTOMY  04/18/2012   [facility]  . BILATERAL SALPINGOOPHORECTOMY  03/2012  . BREAST BIOPSY Left 05/08/2016   neg- [facility]  . BREAST BIOPSY Right 10/2015   neg- [facility]  . LYMPHADENECTOMY  03/2012  . 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 TONSILLECTOMY AND ADENOIDECTOMY  1970  . TUBAL LIGATION  1978   Social History   Socioeconomic History  . Marital status: Widowed    Spouse name: Not on file  . Number of children: 3  . Years of education: 46  . Highest education level: 12th grade  Occupational History    Comment: [employer]  Tobacco Use  . Smoking status: Former Smoker    Packs/day: 0.50    Years: 9.00    Pack years: 4.50    Types: Cigarettes    Quit  date: 04/26/1992    Years since quitting: 28.8  . Smokeless tobacco: Never Used  . Tobacco comment: QUIT IN 1996  Vaping Use  . Vaping Use: Never used  Substance and Sexual Activity  . Alcohol use: No    Alcohol/week: 0.0 standard drinks  . Drug use: No  . Sexual activity: Not on file  Other Topics Concern  . Not on file  Social History Narrative   Widowed   Caffeine use- coffee - 2 cups daily   Social Determinants of Health   Financial Resource Strain: Low Risk   . Difficulty of Paying Living Expenses: Not hard at all  Food Insecurity: No Food Insecurity  . Worried About Charity fundraiser in the Last Year: Never true  . Ran Out of  Food in the Last Year: Never true  Transportation Needs: No Transportation Needs  . Lack of Transportation (Medical): No  . Lack of Transportation (Non-Medical): No  Physical Activity: Inactive  . Days of Exercise per Week: 0 days  . Minutes of Exercise per Session: 0 min  Stress: No Stress Concern Present  . Feeling of Stress : Not at all  Social Connections: Moderately Integrated  . Frequency of Communication with Friends and Family: More than three times a week  . Frequency of Social Gatherings with Friends and Family: More than three times a week  . Attends Religious Services: More than 4 times per year  . Active Member of Clubs or Organizations: Yes  . Attends Archivist Meetings: More than 4 times per year  . Marital Status: Widowed  Intimate Partner Violence: Not At Risk  . Fear of Current or Ex-Partner: No  . Emotionally Abused: No  . Physically Abused: No  . Sexually Abused: No   Family Status  Relation Name Status  . Mother  Alive  . Father  Alive  . Sister 1 Alive  . Sister 2 Alive  . MGM  Deceased  . MGF  Deceased  . PGM  Deceased  . PGF  Deceased  . Other  (Not Specified)  . Mat Uncle  (Not Specified)  . Ethlyn Daniels  (Not Specified)  . Mat Uncle  (Not Specified)   Family History  Problem Relation Age of Onset  . Lung disease Mother   . COPD Mother   . Hypertension Mother   . Heart disease Mother   . Lung disease Father   . Healthy Sister   . Healthy Sister   . Heart attack Maternal Grandmother   . Alzheimer's disease Paternal Grandmother   . Coronary artery disease Other   . Hypertension Maternal Uncle   . Breast cancer Paternal Aunt 69  . Lung cancer Maternal Uncle 71   Allergies  Allergen Reactions  . Codeine Hives  . Penicillins Swelling  . Tetracycline Hives  . Sulfa Antibiotics     Other reaction(s): Skin Rashes    Patient Care Team: Birdie Sons, MD as PCP - General (Family Medicine) Anell Barr, OD as Consulting  Physician (Optometry)   Medications: Outpatient Medications Prior to Visit  Medication Sig Note  . EUTHYROX 50 MCG tablet Take 1 tablet by mouth once daily   . lovastatin (MEVACOR) 40 MG tablet TAKE 1 TABLET BY MOUTH AT BEDTIME (VISIT  NEEDED  FOR  ADDITIONAL  REFILLS)   . Multiple Vitamin (MULTIVITAMIN) capsule Take 1 capsule by mouth daily.   . [DISCONTINUED] Aspirin 81 MG EC tablet 1 tablet every other day.    . [  DISCONTINUED] omeprazole (PRILOSEC) 20 MG capsule Take 1 capsule (20 mg total) by mouth daily. (Patient not taking: No sig reported) 02/21/2021: not needed anymore   No facility-administered medications prior to visit.    Review of Systems    Objective    BP 138/76 (BP Location: Left Arm, Patient Position: Sitting, Cuff Size: Normal)   Pulse 75   Wt 180 lb (81.6 kg)   SpO2 98%   BMI 29.95 kg/m    Physical Exam   General Appearance:     Overweight female. Alert, cooperative, in no acute distress, appears stated age   Head:    Normocephalic, without obvious abnormality, atraumatic  Eyes:    PERRL, conjunctiva/corneas clear, EOM's intact, fundi    benign, both eyes  Ears:    Normal TM's and external ear canals, both ears  Neck:   Supple, symmetrical, trachea midline, no adenopathy;    thyroid:  no enlargement/tenderness/nodules; no carotid   bruit or JVD  Back:     Symmetric, no curvature, ROM normal, no CVA tenderness  Lungs:     Clear to auscultation bilaterally, respirations unlabored  Chest Wall:    No tenderness or deformity   Heart:    Normal heart rate. Normal rhythm. No murmurs, rubs, or gallops.   Breast Exam:    deferred  Abdomen:     Soft, non-tender, bowel sounds active all four quadrants,    no masses, no organomegaly  Pelvic:    deferred  Extremities:   All extremities are intact. No cyanosis or edema  Pulses:   2+ and symmetric all extremities  Skin:   Skin color, texture, turgor normal, no rashes or lesions  Lymph nodes:   Cervical,  supraclavicular, and axillary nodes normal  Neurologic:   CNII-XII intact, normal strength, sensation and reflexes    throughout     Last depression screening scores PHQ 2/9 Scores 02/21/2021 06/03/2020 06/03/2019  PHQ - 2 Score 0 0 1  PHQ- 9 Score - - -   Last fall risk screening Fall Risk  02/21/2021  Falls in the past year? 0  Number falls in past yr: -  Injury with Fall? -   Last Audit-C alcohol use screening Alcohol Use Disorder Test (AUDIT) 06/03/2020  1. How often do you have a drink containing alcohol? 0  2. How many drinks containing alcohol do you have on a typical day when you are drinking? 0  3. How often do you have six or more drinks on one occasion? 0  AUDIT-C Score 0  Alcohol Brief Interventions/Follow-up AUDIT Score <7 follow-up not indicated   A score of 3 or more in women, and 4 or more in men indicates increased risk for alcohol abuse, EXCEPT if all of the points are from question 1   No results found for any visits on 02/21/21.  Assessment & Plan    Routine Health Maintenance and Physical Exam  Exercise Activities and Dietary recommendations Goals    . Cut out extra servings     Recommend cutting out junk food and sweets in daily diet. Pt to focus on protein snacks in between meals.     Marland Kitchen DIET - REDUCE SUGAR INTAKE     Recommend cutting out all desserts and junk food and substituting for fruits.        Immunization History  Administered Date(s) Administered  . Fluad Quad(high Dose 65+) 12/24/2019  . Influenza, High Dose Seasonal PF 07/24/2018  . Influenza,inj,Quad PF,6+ Mos 06/16/2015, 08/01/2016  .  Influenza-Unspecified 08/18/2017  . Pneumococcal Conjugate-13 12/06/2016  . Pneumococcal Polysaccharide-23 07/24/2018  . Tdap 01/21/2009  . Zoster 08/28/2011    Health Maintenance  Topic Date Due  . COVID-19 Vaccine (1) Never done  . TETANUS/TDAP  01/22/2019  . Fecal DNA (Cologuard)  03/27/2021  . INFLUENZA VACCINE  05/23/2021  . MAMMOGRAM   08/18/2021  . DEXA SCAN  08/19/2023  . Hepatitis C Screening  Completed  . PNA vac Low Risk Adult  Completed  . HPV VACCINES  Aged Out    Discussed health benefits of physical activity, and encouraged her to engage in regular exercise appropriate for her age and condition.  1. Annual physical exam Doing well. Mildly obese. Work on increasing exercise.   2. Adult hypothyroidism  - TSH  3. Hypercholesterolemia without hypertriglyceridemia She is tolerating lovastatin well with no adverse effects.   - Lipid panel - Comprehensive metabolic panel - CBC with Differential/Platelet  4. Prescription for Shingrix. Vaccine not administered in office.   - Zoster Vaccine Adjuvanted [facility]) injection; Inject 0.5 mLs into the muscle once for 1 dose. Repeat after 2 months.  Dispense: 0.5 mL; Refill: 0  5. Prescription for Tdap. Vaccine not administered in office.   - Tdap ([place]) 5-2.5-18.5 LF-MCG/0.5 injection; Inject 0.5 mLs into the muscle once for 1 dose.  Dispense: 0.5 mL; Refill: 0      The entirety of the information documented in the History of Present Illness, Review of Systems and Physical Exam were personally obtained by me. Portions of this information were initially documented by the CMA and reviewed by me for thoroughness and accuracy.      Lelon Huh, MD  [facility] [phone] (phone) [phone] (fax)  [place]

## 2021-02-21 NOTE — Patient Instructions (Addendum)
   The CDC recommends two doses of Shingrix (the shingles vaccine) separated by 2 to 6 months for adults age 70 years and older. I recommend checking with your pharmacy plan regarding coverage for this vaccine.      You will be due for Cologuard in June of this year. You should get a collection kit in the mail around that time.   . Covid-19 vaccines: The Covid vaccines have been given to hundreds of millions of people and found to be very effective and are as safe as any other vaccine.  The [employer] vaccine has been associated with very rare dangerous blood clots, but only in adult women under the age of 21.  The risk of dying from Covid infections is much higher than having a serious reaction to the vaccine.  I strongly recommend getting fully vaccinated against Covid-19.  I recommend that adult women under 60 get fully vaccinated, but the [place] vaccines may be safer for those women than the [employer] vaccine.   . Please go to the lab draw station in [address] on the second floor of [facility]  when you are fasting for 8 hours. Normal hours are 8:00am to 11:30am and 1:00pm to 4:00pm Monday through Friday

## 2021-02-23 DIAGNOSIS — E039 Hypothyroidism, unspecified: Secondary | ICD-10-CM | POA: Diagnosis not present

## 2021-02-23 DIAGNOSIS — E7800 Pure hypercholesterolemia, unspecified: Secondary | ICD-10-CM | POA: Diagnosis not present

## 2021-02-24 LAB — CBC WITH DIFFERENTIAL/PLATELET
Basophils Absolute: 0.1 10*3/uL (ref 0.0–0.2)
Basos: 1 %
EOS (ABSOLUTE): 0.3 10*3/uL (ref 0.0–0.4)
Eos: 4 %
Hematocrit: 41.9 % (ref 34.0–46.6)
Hemoglobin: 13.8 g/dL (ref 11.1–15.9)
Immature Grans (Abs): 0 10*3/uL (ref 0.0–0.1)
Immature Granulocytes: 0 %
Lymphocytes Absolute: 1.6 10*3/uL (ref 0.7–3.1)
Lymphs: 22 %
MCH: 29.8 pg (ref 26.6–33.0)
MCHC: 32.9 g/dL (ref 31.5–35.7)
MCV: 91 fL (ref 79–97)
Monocytes Absolute: 0.7 10*3/uL (ref 0.1–0.9)
Monocytes: 10 %
Neutrophils Absolute: 4.5 10*3/uL (ref 1.4–7.0)
Neutrophils: 63 %
Platelets: 271 10*3/uL (ref 150–450)
RBC: 4.63 x10E6/uL (ref 3.77–5.28)
RDW: 12.9 % (ref 11.7–15.4)
WBC: 7.1 10*3/uL (ref 3.4–10.8)

## 2021-02-24 LAB — COMPREHENSIVE METABOLIC PANEL
ALT: 27 IU/L (ref 0–32)
AST: 21 IU/L (ref 0–40)
Albumin/Globulin Ratio: 1.8 (ref 1.2–2.2)
Albumin: 4.3 g/dL (ref 3.8–4.8)
Alkaline Phosphatase: 99 IU/L (ref 44–121)
BUN/Creatinine Ratio: 23 (ref 12–28)
BUN: 24 mg/dL (ref 8–27)
Bilirubin Total: 0.4 mg/dL (ref 0.0–1.2)
CO2: 22 mmol/L (ref 20–29)
Calcium: 10 mg/dL (ref 8.7–10.3)
Chloride: 104 mmol/L (ref 96–106)
Creatinine, Ser: 1.05 mg/dL — ABNORMAL HIGH (ref 0.57–1.00)
Globulin, Total: 2.4 g/dL (ref 1.5–4.5)
Glucose: 131 mg/dL — ABNORMAL HIGH (ref 65–99)
Potassium: 4.6 mmol/L (ref 3.5–5.2)
Sodium: 142 mmol/L (ref 134–144)
Total Protein: 6.7 g/dL (ref 6.0–8.5)
eGFR: 58 mL/min/{1.73_m2} — ABNORMAL LOW (ref >59)

## 2021-02-24 LAB — TSH: TSH: 2.32 u[IU]/mL (ref 0.450–4.500)

## 2021-02-24 LAB — LIPID PANEL
Chol/HDL Ratio: 5.1 ratio — ABNORMAL HIGH (ref 0.0–4.4)
Cholesterol, Total: 215 mg/dL — ABNORMAL HIGH (ref 100–199)
HDL: 42 mg/dL (ref >39)
LDL Chol Calc (NIH): 120 mg/dL — ABNORMAL HIGH (ref 0–99)
Triglycerides: 301 mg/dL — ABNORMAL HIGH (ref 0–149)
VLDL Cholesterol Cal: 53 mg/dL — ABNORMAL HIGH (ref 5–40)

## 2021-03-13 ENCOUNTER — Other Ambulatory Visit: Payer: Self-pay | Admitting: Family Medicine

## 2021-03-18 ENCOUNTER — Other Ambulatory Visit: Payer: Self-pay | Admitting: Family Medicine

## 2021-03-18 DIAGNOSIS — Z1211 Encounter for screening for malignant neoplasm of colon: Secondary | ICD-10-CM

## 2021-04-12 DIAGNOSIS — Z1211 Encounter for screening for malignant neoplasm of colon: Secondary | ICD-10-CM | POA: Diagnosis not present

## 2021-04-17 LAB — COLOGUARD
COLOGUARD: NEGATIVE
Cologuard: NEGATIVE

## 2021-04-26 ENCOUNTER — Telehealth: Payer: Self-pay

## 2021-04-26 NOTE — Telephone Encounter (Signed)
Tried calling patient to advise that her recent cologaurd test result from 04/17/2021 was negative. Left message to call back. OK for [facility] triage to advise of results.

## 2021-04-26 NOTE — Telephone Encounter (Signed)
Pt returned call and was given the message her Cologuard test was negative.

## 2021-05-25 ENCOUNTER — Other Ambulatory Visit: Payer: Self-pay | Admitting: Family Medicine

## 2021-06-28 ENCOUNTER — Other Ambulatory Visit: Payer: Self-pay | Admitting: Family Medicine

## 2021-06-28 ENCOUNTER — Encounter: Payer: Self-pay | Admitting: Family Medicine

## 2021-06-28 DIAGNOSIS — E7800 Pure hypercholesterolemia, unspecified: Secondary | ICD-10-CM

## 2021-06-28 DIAGNOSIS — R7303 Prediabetes: Secondary | ICD-10-CM

## 2021-07-21 ENCOUNTER — Telehealth: Payer: Self-pay

## 2021-07-21 NOTE — Progress Notes (Signed)
Left message for patient to call back and schedule Medicare Annual Wellness Visit (AWV).    Please offer to do virtually or by telephone sometime this week    Last AWV:06/03/2020    Please schedule at anytime with [facility] Advisor.    If any questions, please contact me at [place], [place], [place],  [address] Direct Dial: [phone] [email or web address]: [email or web address]

## 2021-08-29 ENCOUNTER — Other Ambulatory Visit: Payer: Self-pay | Admitting: Family Medicine

## 2021-08-29 NOTE — Telephone Encounter (Signed)
Requested Prescriptions  Pending Prescriptions Disp Refills  . lovastatin (MEVACOR) 40 MG tablet [Pharmacy Med Name: Lovastatin 40 MG Oral Tablet] 90 tablet 0    Sig: TAKE 1 TABLET BY MOUTH AT BEDTIME . APPOINTMENT REQUIRED FOR FUTURE REFILLS     Cardiovascular:  Antilipid - Statins Failed - 08/29/2021  6:23 PM      Failed - Total Cholesterol in normal range and within 360 days    Cholesterol, Total  Date Value Ref Range Status  02/23/2021 215 (H) 100 - 199 mg/dL Final         Failed - LDL in normal range and within 360 days    LDL Chol Calc (NIH)  Date Value Ref Range Status  02/23/2021 120 (H) 0 - 99 mg/dL Final         Failed - Triglycerides in normal range and within 360 days    Triglycerides  Date Value Ref Range Status  02/23/2021 301 (H) 0 - 149 mg/dL Final         Passed - HDL in normal range and within 360 days    HDL  Date Value Ref Range Status  02/23/2021 42 >39 mg/dL Final         Passed - Patient is not pregnant      Passed - Valid encounter within last 12 months    Recent Outpatient Visits          6 months ago Annual physical exam   [facility] Birdie Sons, MD   1 year ago Trigger point   [place], [place], [place]   1 year ago Annual physical exam   [facility] Birdie Sons, MD   1 year ago Stuffy nose   [place], [place]   2 years ago Chronic heel pain, right   [place], Vickki Muff, [place]

## 2021-09-05 ENCOUNTER — Telehealth: Payer: Self-pay | Admitting: Family Medicine

## 2021-09-05 MED ORDER — LEVOTHYROXINE SODIUM 50 MCG PO TABS
50.0000 ug | ORAL_TABLET | Freq: Every day | ORAL | 0 refills | Status: DC
Start: 2021-09-05 — End: 2021-11-09

## 2021-09-05 NOTE — Telephone Encounter (Signed)
[facility] faxed refill request for the following medications:   levothyroxine (EUTHYROX) 50 MCG tablet     Please advise.  

## 2021-09-08 ENCOUNTER — Telehealth: Payer: Self-pay | Admitting: Family Medicine

## 2021-09-08 NOTE — Telephone Encounter (Signed)
[facility] faxed refill request for the following medications:   EUTHYROX 50 MCG tablet  Please advise.

## 2021-09-08 NOTE — Addendum Note (Signed)
Addended by: Ashley Royalty E on: 09/08/2021 04:14 PM   Modules accepted: Orders

## 2021-09-09 DIAGNOSIS — R7303 Prediabetes: Secondary | ICD-10-CM | POA: Diagnosis not present

## 2021-09-09 DIAGNOSIS — E7800 Pure hypercholesterolemia, unspecified: Secondary | ICD-10-CM | POA: Diagnosis not present

## 2021-09-10 LAB — LIPID PANEL
Chol/HDL Ratio: 3.7 ratio (ref 0.0–4.4)
Cholesterol, Total: 172 mg/dL (ref 100–199)
HDL: 46 mg/dL (ref >39)
LDL Chol Calc (NIH): 90 mg/dL (ref 0–99)
Triglycerides: 211 mg/dL — ABNORMAL HIGH (ref 0–149)
VLDL Cholesterol Cal: 36 mg/dL (ref 5–40)

## 2021-09-10 LAB — HEMOGLOBIN A1C
Est. average glucose Bld gHb Est-mCnc: 126 mg/dL
Hgb A1c MFr Bld: 6 % — ABNORMAL HIGH (ref 4.8–5.6)

## 2021-10-27 ENCOUNTER — Telehealth: Payer: Self-pay | Admitting: Family Medicine

## 2021-10-27 NOTE — Telephone Encounter (Signed)
[facility] faxed refill request for the following medications:   levothyroxine (EUTHYROX) 50 MCG tablet     Please advise.  

## 2021-11-09 ENCOUNTER — Other Ambulatory Visit: Payer: Self-pay | Admitting: Family Medicine

## 2021-11-09 MED ORDER — LEVOTHYROXINE SODIUM 50 MCG PO TABS: 50.0000 ug | ORAL_TABLET | Freq: Every day | ORAL | 1 refills | Status: DC

## 2021-11-28 ENCOUNTER — Other Ambulatory Visit: Payer: Self-pay | Admitting: Family Medicine

## 2021-12-27 ENCOUNTER — Ambulatory Visit (INDEPENDENT_AMBULATORY_CARE_PROVIDER_SITE_OTHER): Payer: PPO

## 2021-12-27 DIAGNOSIS — Z1231 Encounter for screening mammogram for malignant neoplasm of breast: Secondary | ICD-10-CM | POA: Diagnosis not present

## 2021-12-27 DIAGNOSIS — Z0000 Encounter for general adult medical examination without abnormal findings: Secondary | ICD-10-CM | POA: Diagnosis not present

## 2021-12-27 NOTE — Patient Instructions (Signed)
Melinda Mack , Thank you for taking time to come for your Medicare Wellness Visit. I appreciate your ongoing commitment to your health goals. Please review the following plan we discussed and let me know if I can assist you in the future.   Screening recommendations/referrals: Colonoscopy: Cologuard 04/17/21 Mammogram: 08/18/20, referral sent Bone Density: 08/18/20 Recommended yearly ophthalmology/optometry visit for glaucoma screening and checkup Recommended yearly dental visit for hygiene and checkup  Vaccinations: Influenza vaccine: 12/24/19 Pneumococcal vaccine: 07/24/18 Tdap vaccine: 01/21/09, due Shingles vaccine: Zostavax 08/28/11   Covid-19:n/d  Advanced directives: n/d  Conditions/risks identified: none  Next appointment: Follow up in one year for your annual wellness visit - 01/01/23 @ 2:20pm by phone   Preventive Care 57 Years and Older, Female Preventive care refers to lifestyle choices and visits with your health care provider that can promote health and wellness. What does preventive care include? A yearly physical exam. This is also called an annual well check. Dental exams once or twice a year. Routine eye exams. Ask your health care provider how often you should have your eyes checked. Personal lifestyle choices, including: Daily care of your teeth and gums. Regular physical activity. Eating a healthy diet. Avoiding tobacco and drug use. Limiting alcohol use. Practicing safe sex. Taking low-dose aspirin every day. Taking vitamin and mineral supplements as recommended by your health care provider. What happens during an annual well check? The services and screenings done by your health care provider during your annual well check will depend on your age, overall health, lifestyle risk factors, and family history of disease. Counseling  Your health care provider may ask you questions about your: Alcohol use. Tobacco use. Drug use. Emotional well-being. Home and  relationship well-being. Sexual activity. Eating habits. History of falls. Memory and ability to understand (cognition). Work and work Statistician. Reproductive health. Screening  You may have the following tests or measurements: Height, weight, and BMI. Blood pressure. Lipid and cholesterol levels. These may be checked every 5 years, or more frequently if you are over 48 years old. Skin check. Lung cancer screening. You may have this screening every year starting at age 42 if you have a 30-pack-year history of smoking and currently smoke or have quit within the past 15 years. Fecal occult blood test (FOBT) of the stool. You may have this test every year starting at age 38. Flexible sigmoidoscopy or colonoscopy. You may have a sigmoidoscopy every 5 years or a colonoscopy every 10 years starting at age 66. Hepatitis C blood test. Hepatitis B blood test. Sexually transmitted disease (STD) testing. Diabetes screening. This is done by checking your blood sugar (glucose) after you have not eaten for a while (fasting). You may have this done every 1-3 years. Bone density scan. This is done to screen for osteoporosis. You may have this done starting at age 53. Mammogram. This may be done every 1-2 years. Talk to your health care provider about how often you should have regular mammograms. Talk with your health care provider about your test results, treatment options, and if necessary, the need for more tests. Vaccines  Your health care provider may recommend certain vaccines, such as: Influenza vaccine. This is recommended every year. Tetanus, diphtheria, and acellular pertussis (Tdap, Td) vaccine. You may need a Td booster every 10 years. Zoster vaccine. You may need this after age 36. Pneumococcal 13-valent conjugate (PCV13) vaccine. One dose is recommended after age 99. Pneumococcal polysaccharide (PPSV23) vaccine. One dose is recommended after age 61. Talk to  your health care provider  about which screenings and vaccines you need and how often you need them. This information is not intended to replace advice given to you by your health care provider. Make sure you discuss any questions you have with your health care provider. Document Released: 11/05/2015 Document Revised: 06/28/2016 Document Reviewed: 08/10/2015 Elsevier Interactive Patient Education  2017 [employer] Prevention in the Home Falls can cause injuries. They can happen to people of all ages. There are many things you can do to make your home safe and to help prevent falls. What can I do on the outside of my home? Regularly fix the edges of walkways and driveways and fix any cracks. Remove anything that might make you trip as you walk through a door, such as a raised step or threshold. Trim any bushes or trees on the path to your home. Use bright outdoor lighting. Clear any walking paths of anything that might make someone trip, such as rocks or tools. Regularly check to see if handrails are loose or broken. Make sure that both sides of any steps have handrails. Any raised decks and porches should have guardrails on the edges. Have any leaves, snow, or ice cleared regularly. Use sand or salt on walking paths during winter. Clean up any spills in your garage right away. This includes oil or grease spills. What can I do in the bathroom? Use night lights. Install grab bars by the toilet and in the tub and shower. Do not use towel bars as grab bars. Use non-skid mats or decals in the tub or shower. If you need to sit down in the shower, use a plastic, non-slip stool. Keep the floor dry. Clean up any water that spills on the floor as soon as it happens. Remove soap buildup in the tub or shower regularly. Attach bath mats securely with double-sided non-slip rug tape. Do not have throw rugs and other things on the floor that can make you trip. What can I do in the bedroom? Use night lights. Make sure  that you have a light by your bed that is easy to reach. Do not use any sheets or blankets that are too big for your bed. They should not hang down onto the floor. Have a firm chair that has side arms. You can use this for support while you get dressed. Do not have throw rugs and other things on the floor that can make you trip. What can I do in the kitchen? Clean up any spills right away. Avoid walking on wet floors. Keep items that you use a lot in easy-to-reach places. If you need to reach something above you, use a strong step stool that has a grab bar. Keep electrical cords out of the way. Do not use floor polish or wax that makes floors slippery. If you must use wax, use non-skid floor wax. Do not have throw rugs and other things on the floor that can make you trip. What can I do with my stairs? Do not leave any items on the stairs. Make sure that there are handrails on both sides of the stairs and use them. Fix handrails that are broken or loose. Make sure that handrails are as long as the stairways. Check any carpeting to make sure that it is firmly attached to the stairs. Fix any carpet that is loose or worn. Avoid having throw rugs at the top or bottom of the stairs. If you do have throw rugs, attach  them to the floor with carpet tape. Make sure that you have a light switch at the top of the stairs and the bottom of the stairs. If you do not have them, ask someone to add them for you. What else can I do to help prevent falls? Wear shoes that: Do not have high heels. Have rubber bottoms. Are comfortable and fit you well. Are closed at the toe. Do not wear sandals. If you use a stepladder: Make sure that it is fully opened. Do not climb a closed stepladder. Make sure that both sides of the stepladder are locked into place. Ask someone to hold it for you, if possible. Clearly mark and make sure that you can see: Any grab bars or handrails. First and last steps. Where the edge of  each step is. Use tools that help you move around (mobility aids) if they are needed. These include: Canes. Walkers. Scooters. Crutches. Turn on the lights when you go into a dark area. Replace any light bulbs as soon as they burn out. Set up your furniture so you have a clear path. Avoid moving your furniture around. If any of your floors are uneven, fix them. If there are any pets around you, be aware of where they are. Review your medicines with your doctor. Some medicines can make you feel dizzy. This can increase your chance of falling. Ask your doctor what other things that you can do to help prevent falls. This information is not intended to replace advice given to you by your health care provider. Make sure you discuss any questions you have with your health care provider. Document Released: 08/05/2009 Document Revised: 03/16/2016 Document Reviewed: 11/13/2014 Elsevier Interactive Patient Education  2017 [employer].

## 2021-12-27 NOTE — Progress Notes (Signed)
Virtual Visit via Telephone Note  I connected with  Melinda Mack on 12/27/21 at  2:20 PM EST by telephone and verified that I am speaking with the correct person using two identifiers.  Location: Patient: home Provider: [facility] Persons participating in the virtual visit: [place]   I discussed the limitations, risks, security and privacy concerns of performing an evaluation and management service by telephone and the availability of in person appointments. The patient expressed understanding and agreed to proceed.  Interactive audio and video telecommunications were attempted between this nurse and patient, however failed, due to patient having technical difficulties OR patient did not have access to video capability.  We continued and completed visit with audio only.  Some vital signs may be absent or patient reported.   Melinda David, LPN  Subjective:   Melinda Mack is a 71 y.o. female who presents for Medicare Annual (Subsequent) preventive examination.  Review of Systems           Objective:    There were no vitals filed for this visit. There is no height or weight on file to calculate BMI.  Advanced Directives 06/03/2020 06/03/2019 05/24/2018 04/24/2017 07/05/2015  Does Patient Have a Medical Advance Directive? Yes Yes No No Yes  Type of Paramedic of [place];Living will Living will;Healthcare Power of Attorney - - -  Copy of [place] in Chart? No - copy requested No - copy requested - - No - copy requested  Would patient like information on creating a medical advance directive? - - Yes (MAU/Ambulatory/Procedural Areas - Information given) Yes (ED - Information included in AVS) -    Current Medications (verified) Outpatient Encounter Medications as of 12/27/2021  Medication Sig   levothyroxine (EUTHYROX) 50 MCG tablet Take 1 tablet (50 mcg total) by mouth daily.   lovastatin (MEVACOR) 40 MG tablet  TAKE 1 TABLET BY MOUTH AT BEDTIME APPOINTMENT  NEEDED  FOR  FUTURE  REFILLS   Multiple Vitamin (MULTIVITAMIN) capsule Take 1 capsule by mouth daily.   No facility-administered encounter medications on file as of 12/27/2021.    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   uterine   History of chicken pox    History of measles    History of mumps    Past Surgical History:  Procedure Laterality Date   ABDOMINAL HYSTERECTOMY  04/18/2012   cancer at [facility]  03/2012   BREAST BIOPSY Left 05/08/2016   neg- [facility]   BREAST BIOPSY Right 10/2015   neg- [facility]   LYMPHADENECTOMY  03/2012   MANDIBLE FRACTURE SURGERY  1984   TMJ   TONSILLECTOMY AND ADENOIDECTOMY  1970   TUBAL LIGATION  1978   Family History  Problem Relation Age of Onset   Lung disease Mother    COPD Mother    Hypertension Mother    Heart disease Mother    Lung disease Father    Healthy Sister    Healthy Sister    Heart attack Maternal Grandmother    Alzheimer's disease Paternal Grandmother    Coronary artery disease Other    Hypertension Maternal Uncle    Breast cancer Paternal Aunt 35   Lung cancer Maternal Uncle 71   Social History   Socioeconomic History   Marital status: Widowed    Spouse name: Not on file   Number of children:  3   Years of education: 2   Highest education level: 12th grade  Occupational History    Comment: [employer]  Tobacco Use   Smoking status: Former    Packs/day: 0.50    Years: 9.00    Pack years: 4.50    Types: Cigarettes    Quit date: 04/26/1992    Years since quitting: 29.6   Smokeless tobacco: Never   Tobacco comments:    QUIT IN 1996  Vaping Use   Vaping Use: Never used  Substance and Sexual Activity   Alcohol use: No    Alcohol/week: 0.0 standard drinks   Drug use: No   Sexual activity: Not on file  Other Topics  Concern   Not on file  Social History Narrative   Widowed   Caffeine use- coffee - 2 cups daily   Social Determinants of Health   Financial Resource Strain: Not on file  Food Insecurity: Not on file  Transportation Needs: Not on file  Physical Activity: Not on file  Stress: Not on file  Social Connections: Not on file    Tobacco Counseling Counseling given: Not Answered Tobacco comments: QUIT IN 1996   Clinical Intake:  Pre-visit preparation completed: Yes  Pain : No/denies pain     Nutritional Risks: None Diabetes: No  How often do you need to have someone help you when you read instructions, pamphlets, or other written materials from your doctor or pharmacy?: 1 - Never  Diabetic?no  Interpreter Needed?: No  Information entered by :: Kirke Shaggy, LPN   Activities of Daily Living No flowsheet data found.  Patient Care Team: Birdie Sons, MD as PCP - General (Family Medicine) Anell Barr, OD as Consulting Physician (Optometry)  Indicate any recent Medical Services you may have received from other than [facility] providers in the past year (date may be approximate).     Assessment:   This is a routine wellness examination for Melinda Mack.  Hearing/Vision screen No results found.  Dietary issues and exercise activities discussed:     Goals Addressed   None    Depression Screen PHQ 2/9 Scores 02/21/2021 06/03/2020 06/03/2019 05/24/2018 03/14/2018 04/24/2017 04/17/2017  PHQ - 2 Score 0 0 '1 1 1 2 1  '$ PHQ- 9 Score - - - - - 8 -    Fall Risk Fall Risk  02/21/2021 06/03/2020 12/24/2019 06/03/2019 05/24/2018  Falls in the past year? 0 0 0 0 No  Number falls in past yr: - 0 0 - -  Injury with Fall? - 0 0 - -    FALL RISK PREVENTION PERTAINING TO THE HOME:  Any stairs in or around the home? No  If so, are there any without handrails? No  Home free of loose throw rugs in walkways, pet beds, electrical cords, etc? Yes  Adequate lighting in your home to reduce risk of  falls? Yes   ASSISTIVE DEVICES UTILIZED TO PREVENT FALLS:  Life alert? No  Use of a cane, walker or w/c? No  Grab bars in the bathroom? No  Shower chair or bench in shower? No  Elevated toilet seat or a handicapped toilet? No    Cognitive Function:Normal cognitive status assessed by direct observation by this Nurse Health Advisor. No abnormalities found.       6CIT Screen 04/24/2017  What Year? 0 points  What month? 0 points  What time? 0 points  Count back from 20 0 points  Months in reverse 0 points  Repeat phrase  0 points  Total Score 0    Immunizations Immunization History  Administered Date(s) Administered   Fluad Quad(high Dose 65+) 12/24/2019   Influenza, High Dose Seasonal PF 07/24/2018   Influenza,inj,Quad PF,6+ Mos 06/16/2015, 08/01/2016   Influenza-Unspecified 08/18/2017   Pneumococcal Conjugate-13 12/06/2016   Pneumococcal Polysaccharide-23 07/24/2018   Tdap 01/21/2009   Zoster, Live 08/28/2011    TDAP status: Due, Education has been provided regarding the importance of this vaccine. Advised may receive this vaccine at local pharmacy or Health Dept. Aware to provide a copy of the vaccination record if obtained from local pharmacy or Health Dept. Verbalized acceptance and understanding.  Flu Vaccine status: Declined, Education has been provided regarding the importance of this vaccine but patient still declined. Advised may receive this vaccine at local pharmacy or Health Dept. Aware to provide a copy of the vaccination record if obtained from local pharmacy or Health Dept. Verbalized acceptance and understanding.  Pneumococcal vaccine status: Up to date  Covid-19 vaccine status: Declined, Education has been provided regarding the importance of this vaccine but patient still declined. Advised may receive this vaccine at local pharmacy or Health Dept.or vaccine clinic. Aware to provide a copy of the vaccination record if obtained from local pharmacy or Health Dept.  Verbalized acceptance and understanding.  Qualifies for Shingles Vaccine? Yes   Zostavax completed Yes   Shingrix Completed?: No.    Education has been provided regarding the importance of this vaccine. Patient has been advised to call insurance company to determine out of pocket expense if they have not yet received this vaccine. Advised may also receive vaccine at local pharmacy or Health Dept. Verbalized acceptance and understanding.  Screening Tests Health Maintenance  Topic Date Due   COVID-19 Vaccine (1) Never done   Zoster Vaccines- Shingrix (1 of 2) Never done   TETANUS/TDAP  01/22/2019   INFLUENZA VACCINE  05/23/2021   MAMMOGRAM  08/18/2021   DEXA SCAN  08/19/2023   Fecal DNA (Cologuard)  04/12/2024   Pneumonia Vaccine 28+ Years old  Completed   Hepatitis C Screening  Completed   HPV VACCINES  Aged Out    Health Maintenance  Health Maintenance Due  Topic Date Due   COVID-19 Vaccine (1) Never done   Zoster Vaccines- Shingrix (1 of 2) Never done   TETANUS/TDAP  01/22/2019   INFLUENZA VACCINE  05/23/2021   MAMMOGRAM  08/18/2021    Colorectal cancer screening: Type of screening: Cologuard. Completed 04/17/21. Repeat every 3 years  Mammogram status: Completed 08/18/20. Repeat every year  Bone Density status: Completed 08/18/20. Results reflect: Bone density results: OSTEOPOROSIS. Repeat every 3 years.  Lung Cancer Screening: (Low Dose CT Chest recommended if Age 75-80 years, 30 pack-year currently smoking OR have quit w/in 15years.) does not qualify.    Additional Screening:  Hepatitis C Screening: does qualify; Completed 10/24/12  Vision Screening: Recommended annual ophthalmology exams for early detection of glaucoma and other disorders of the eye. Is the patient up to date with their annual eye exam?  Yes  Who is the provider or what is the name of the office in which the patient attends annual eye exams? Dr.Woodard If pt is not established with a provider, would  they like to be referred to a provider to establish care? No .   Dental Screening: Recommended annual dental exams for proper oral hygiene  Community Resource Referral / Chronic Care Management: CRR required this visit?  No   CCM required this visit?  No  Plan:     I have personally reviewed and noted the following in the patients chart:   Medical and social history Use of alcohol, tobacco or illicit drugs  Current medications and supplements including opioid prescriptions.  Functional ability and status Nutritional status Physical activity Advanced directives List of other physicians Hospitalizations, surgeries, and ER visits in previous 12 months Vitals Screenings to include cognitive, depression, and falls Referrals and appointments  In addition, I have reviewed and discussed with patient certain preventive protocols, quality metrics, and best practice recommendations. A written personalized care plan for preventive services as well as general preventive health recommendations were provided to patient.     Melinda David, LPN   11/26/4693   Nurse Notes: none

## 2022-01-04 LAB — LIPID PANEL
Cholesterol: 167 (ref 0–200)
HDL: 42 (ref 35–70)
LDL Cholesterol: 56
Triglycerides: 345 — AB (ref 40–160)

## 2022-01-04 LAB — BASIC METABOLIC PANEL: Glucose: 110

## 2022-03-02 ENCOUNTER — Other Ambulatory Visit: Payer: Self-pay | Admitting: Family Medicine

## 2022-03-06 DIAGNOSIS — H2513 Age-related nuclear cataract, bilateral: Secondary | ICD-10-CM | POA: Diagnosis not present

## 2022-03-06 DIAGNOSIS — H43819 Vitreous degeneration, unspecified eye: Secondary | ICD-10-CM | POA: Diagnosis not present

## 2022-03-06 DIAGNOSIS — H40013 Open angle with borderline findings, low risk, bilateral: Secondary | ICD-10-CM | POA: Diagnosis not present

## 2022-04-07 ENCOUNTER — Other Ambulatory Visit: Payer: Self-pay | Admitting: Family Medicine

## 2022-04-07 MED ORDER — LOVASTATIN 40 MG PO TABS: ORAL_TABLET | ORAL | 0 refills | Status: DC

## 2022-04-07 NOTE — Telephone Encounter (Signed)
Medication Refill - Medication: lovastatin (MEVACOR) 40 MG tablet  Has the patient contacted their pharmacy? No.  Preferred Pharmacy (with phone number or street name):  [facility], [place] Phone:  [phone]  Fax:  [phone]      Has the patient been seen for an appointment in the last year OR does the patient have an upcoming appointment? Yes.    Agent: Please be advised that RX refills may take up to 3 business days. We ask that you follow-up with your pharmacy.

## 2022-04-07 NOTE — Telephone Encounter (Signed)
Requested Prescriptions  Pending Prescriptions Disp Refills  . lovastatin (MEVACOR) 40 MG tablet 30 tablet 0    Sig: TAKE 1 TABLET BY MOUTH AT BEDTIME . APPOINTMENT REQUIRED FOR FUTURE REFILLS     Cardiovascular:  Antilipid - Statins 2 Failed - 04/07/2022 10:10 AM      Failed - Cr in normal range and within 360 days    Creatinine  Date Value Ref Range Status  04/23/2014 0.98 0.60 - 1.30 mg/dL Final   Creatinine, Ser  Date Value Ref Range Status  02/23/2021 1.05 (H) 0.57 - 1.00 mg/dL Final         Failed - Lipid Panel in normal range within the last 12 months    Cholesterol, Total  Date Value Ref Range Status  09/09/2021 172 100 - 199 mg/dL Final   LDL Chol Calc (NIH)  Date Value Ref Range Status  09/09/2021 90 0 - 99 mg/dL Final   HDL  Date Value Ref Range Status  09/09/2021 46 >39 mg/dL Final   Triglycerides  Date Value Ref Range Status  09/09/2021 211 (H) 0 - 149 mg/dL Final         Passed - Patient is not pregnant      Passed - Valid encounter within last 12 months    Recent Outpatient Visits          1 year ago Annual physical exam   [facility] Birdie Sons, MD   2 years ago Trigger point   [facility], Clearnce Sorrel, [place]   2 years ago Annual physical exam   [facility] Birdie Sons, MD   2 years ago [place], Vickki Muff, PA-C   3 years ago Chronic heel pain, right   [facility], Vickki Muff, PA-C      Future Appointments            In 1 month Fisher, Kirstie Peri, MD [facility], [place]

## 2022-04-27 ENCOUNTER — Other Ambulatory Visit: Payer: Self-pay | Admitting: Family Medicine

## 2022-04-29 ENCOUNTER — Other Ambulatory Visit: Payer: Self-pay | Admitting: Family Medicine

## 2022-05-08 ENCOUNTER — Ambulatory Visit: Payer: PPO | Admitting: Family Medicine

## 2022-05-09 NOTE — Progress Notes (Signed)
Established patient visit  I,Joseline E Rosas,acting as a scribe for [employer], PA-C.,have documented all relevant documentation on the behalf of Mardene Speak, PA-C,as directed by  [employer], PA-C while in the presence of [employer], PA-C.   Patient: Melinda Mack   DOB: 12-14-1950   71 y.o. Female  MRN: [id number] Visit Date: 05/10/2022  Today's healthcare provider: Mardene Speak, PA-C   Chief Complaint  Patient presents with   Follow-up  Follow up cholesterol, pre-diabetes  Subjective    Lipid/Cholesterol, Follow-up  Last lipid panel Other pertinent labs  Lab Results  Component Value Date   CHOL 172 09/09/2021   HDL 46 09/09/2021   LDLCALC 90 09/09/2021   TRIG 211 (H) 09/09/2021   CHOLHDL 3.7 09/09/2021   Lab Results  Component Value Date   ALT 27 02/23/2021   AST 21 02/23/2021   PLT 271 02/23/2021   TSH 2.320 02/23/2021     She was last seen for this 14 months ago.  Management since that visit includes continue current medication .  She reports excellent compliance with treatment. She is not having side effects.   Symptoms: No chest pain No chest pressure/discomfort  No dyspnea No lower extremity edema  No numbness or tingling of extremity No orthopnea  No palpitations No paroxysmal nocturnal dyspnea  No speech difficulty No syncope   Current diet: in general, a "healthy" diet  , well balanced Current exercise: walking and yard work  The 10-year ASCVD risk score (Arnett DK, et al., 2019) is: 10.9%  ---------------------------------------------------------------------------------------------------  Prediabetes, Follow-up  Lab Results  Component Value Date   HGBA1C 6.0 (H) 09/09/2021   HGBA1C 6.0 (H) 12/24/2019   HGBA1C 6.1 (H) 07/24/2018   GLUCOSE 131 (H) 02/23/2021   GLUCOSE 111 (H) 12/24/2019   GLUCOSE 119 (H) 03/14/2018    Last seen for for this14 months ago.  Management since that visit includes : no need for medication at  this point.  Average blood sugar 126. Current symptoms include none and have been stable.  Prior visit with dietician: no Current diet: well balanced Current exercise: walking and yard work  Pertinent Labs:    Component Value Date/Time   CHOL 172 09/09/2021 0853   TRIG 211 (H) 09/09/2021 0853   CHOLHDL 3.7 09/09/2021 0853   CREATININE 1.05 (H) 02/23/2021 0803   CREATININE 0.98 04/23/2014 1212    Wt Readings from Last 3 Encounters:  05/10/22 167 lb 6.4 oz (75.9 kg)  02/21/21 180 lb (81.6 kg)  01/19/20 174 lb 6.4 oz (79.1 kg)    -----------------------------------------------------------------------------------------  Medications: Outpatient Medications Prior to Visit  Medication Sig   levothyroxine (SYNTHROID) 50 MCG tablet Take 1 tablet by mouth once daily   lovastatin (MEVACOR) 40 MG tablet TAKE 1 TABLET BY MOUTH AT BEDTIME (APPOINTMENT  REQUIRED  FOR  FUTURE  REFILLS)   Multiple Vitamin (MULTIVITAMIN) capsule Take 1 capsule by mouth daily.   No facility-administered medications prior to visit.    Review of Systems  All other systems reviewed and are negative. Except see HPI     Objective    BP 131/74 (BP Location: Left Arm, Patient Position: Sitting, Cuff Size: Normal)   Pulse 65   Temp 98.1 F (36.7 C) (Oral)   Resp 16   Wt 167 lb 6.4 oz (75.9 kg)   BMI 27.86 kg/m    Physical Exam Vitals reviewed.  Constitutional:      General: She is not in acute distress.  Appearance: Normal appearance. She is well-developed. She is not diaphoretic.  HENT:     Head: Normocephalic and atraumatic.  Eyes:     General: No scleral icterus.    Conjunctiva/sclera: Conjunctivae normal.  Neck:     Thyroid: No thyromegaly.  Cardiovascular:     Rate and Rhythm: Normal rate and regular rhythm.     Pulses: Normal pulses.     Heart sounds: Normal heart sounds. No murmur heard. Pulmonary:     Effort: Pulmonary effort is normal. No respiratory distress.     Breath sounds:  Normal breath sounds. No wheezing, rhonchi or rales.  Musculoskeletal:     Cervical back: Neck supple.     Right lower leg: No edema.     Left lower leg: No edema.  Lymphadenopathy:     Cervical: No cervical adenopathy.  Skin:    General: Skin is warm and dry.     Findings: No rash.  Neurological:     Mental Status: She is alert and oriented to person, place, and time. Mental status is at baseline.  Psychiatric:        Mood and Affect: Mood normal.        Behavior: Behavior normal.     No results found for any visits on 05/10/22.  Assessment & Plan     1. Hypercholesterolemia without hypertriglyceridemia The 10-year ASCVD risk score (Arnett DK, et al., 2019) is: 11%  - Comprehensive metabolic panel - Hemoglobin A1c - Lipid panel - CBC with Differential/Platelet - lovastatin (MEVACOR) 40 MG tablet; TAKE 1 TABLET BY MOUTH AT BEDTIME (APPOINTMENT  REQUIRED  FOR  FUTURE  REFILLS)  Dispense: 30 tablet; Refill: 0  2. Adult hypothyroidism  - TSH  3. Other fatigue  - CBC with Differential/Platelet   Fu as scheduled The patient was advised to call back or seek an in-person evaluation if the symptoms worsen or if the condition fails to improve as anticipated.  I discussed the assessment and treatment plan with the patient. The patient was provided an opportunity to ask questions and all were answered. The patient agreed with the plan and demonstrated an understanding of the instructions.  The entirety of the information documented in the History of Present Illness, Review of Systems and Physical Exam were personally obtained by me. Portions of this information were initially documented by the CMA and reviewed by me for thoroughness and accuracy.  Portions of this note were created using dictation software and may contain typographical errors.   Mardene Speak, PA-C  [facility] [phone] (phone) [phone] (fax)  [place]

## 2022-05-10 ENCOUNTER — Ambulatory Visit (INDEPENDENT_AMBULATORY_CARE_PROVIDER_SITE_OTHER): Payer: PPO | Admitting: Physician Assistant

## 2022-05-10 ENCOUNTER — Encounter: Payer: Self-pay | Admitting: Physician Assistant

## 2022-05-10 VITALS — BP 131/74 | HR 65 | Temp 98.1°F | Resp 16 | Wt 167.4 lb

## 2022-05-10 DIAGNOSIS — E039 Hypothyroidism, unspecified: Secondary | ICD-10-CM | POA: Diagnosis not present

## 2022-05-10 DIAGNOSIS — R5383 Other fatigue: Secondary | ICD-10-CM | POA: Diagnosis not present

## 2022-05-10 DIAGNOSIS — E7800 Pure hypercholesterolemia, unspecified: Secondary | ICD-10-CM | POA: Diagnosis not present

## 2022-05-10 DIAGNOSIS — R7303 Prediabetes: Secondary | ICD-10-CM | POA: Diagnosis not present

## 2022-05-10 MED ORDER — LOVASTATIN 40 MG PO TABS: ORAL_TABLET | ORAL | 0 refills | Status: DC

## 2022-05-11 LAB — CBC WITH DIFFERENTIAL/PLATELET
Basophils Absolute: 0 10*3/uL (ref 0.0–0.2)
Basos: 1 %
EOS (ABSOLUTE): 0.2 10*3/uL (ref 0.0–0.4)
Eos: 3 %
Hematocrit: 43 % (ref 34.0–46.6)
Hemoglobin: 14 g/dL (ref 11.1–15.9)
Immature Grans (Abs): 0 10*3/uL (ref 0.0–0.1)
Immature Granulocytes: 0 %
Lymphocytes Absolute: 1.4 10*3/uL (ref 0.7–3.1)
Lymphs: 21 %
MCH: 29.4 pg (ref 26.6–33.0)
MCHC: 32.6 g/dL (ref 31.5–35.7)
MCV: 90 fL (ref 79–97)
Monocytes Absolute: 0.5 10*3/uL (ref 0.1–0.9)
Monocytes: 8 %
Neutrophils Absolute: 4.4 10*3/uL (ref 1.4–7.0)
Neutrophils: 67 %
Platelets: 277 10*3/uL (ref 150–450)
RBC: 4.77 x10E6/uL (ref 3.77–5.28)
RDW: 13 % (ref 11.7–15.4)
WBC: 6.6 10*3/uL (ref 3.4–10.8)

## 2022-05-11 LAB — COMPREHENSIVE METABOLIC PANEL
ALT: 21 IU/L (ref 0–32)
AST: 20 IU/L (ref 0–40)
Albumin/Globulin Ratio: 1.8 (ref 1.2–2.2)
Albumin: 4.4 g/dL (ref 3.8–4.8)
Alkaline Phosphatase: 116 IU/L (ref 44–121)
BUN/Creatinine Ratio: 20 (ref 12–28)
BUN: 19 mg/dL (ref 8–27)
Bilirubin Total: 0.5 mg/dL (ref 0.0–1.2)
CO2: 21 mmol/L (ref 20–29)
Calcium: 9.5 mg/dL (ref 8.7–10.3)
Chloride: 107 mmol/L — ABNORMAL HIGH (ref 96–106)
Creatinine, Ser: 0.94 mg/dL (ref 0.57–1.00)
Globulin, Total: 2.4 g/dL (ref 1.5–4.5)
Glucose: 105 mg/dL — ABNORMAL HIGH (ref 70–99)
Potassium: 4.7 mmol/L (ref 3.5–5.2)
Sodium: 143 mmol/L (ref 134–144)
Total Protein: 6.8 g/dL (ref 6.0–8.5)
eGFR: 65 mL/min/{1.73_m2} (ref >59)

## 2022-05-11 LAB — TSH: TSH: 1.83 u[IU]/mL (ref 0.450–4.500)

## 2022-05-11 LAB — HEMOGLOBIN A1C
Est. average glucose Bld gHb Est-mCnc: 126 mg/dL
Hgb A1c MFr Bld: 6 % — ABNORMAL HIGH (ref 4.8–5.6)

## 2022-05-11 LAB — LIPID PANEL
Chol/HDL Ratio: 4.1 ratio (ref 0.0–4.4)
Cholesterol, Total: 172 mg/dL (ref 100–199)
HDL: 42 mg/dL (ref >39)
LDL Chol Calc (NIH): 92 mg/dL (ref 0–99)
Triglycerides: 223 mg/dL — ABNORMAL HIGH (ref 0–149)
VLDL Cholesterol Cal: 38 mg/dL (ref 5–40)

## 2022-05-11 NOTE — Progress Notes (Signed)
[place] ,   Your labwork results all are within normal limits/stable for you No changes need to be made to medications, and no further tests need to be ordered.  Any questions please reach out to the office or message me on MyChart!  Best,  Mardene Speak, PA-C

## 2022-05-22 LAB — C-REACTIVE PROTEIN: CRP: 3.1

## 2022-06-22 ENCOUNTER — Ambulatory Visit (INDEPENDENT_AMBULATORY_CARE_PROVIDER_SITE_OTHER): Payer: PPO | Admitting: Physician Assistant

## 2022-06-22 VITALS — BP 115/67 | HR 68 | Temp 98.1°F | Wt 167.0 lb

## 2022-06-22 DIAGNOSIS — E039 Hypothyroidism, unspecified: Secondary | ICD-10-CM | POA: Diagnosis not present

## 2022-06-22 DIAGNOSIS — Z0000 Encounter for general adult medical examination without abnormal findings: Secondary | ICD-10-CM | POA: Diagnosis not present

## 2022-06-22 DIAGNOSIS — R7303 Prediabetes: Secondary | ICD-10-CM | POA: Diagnosis not present

## 2022-06-22 DIAGNOSIS — E7800 Pure hypercholesterolemia, unspecified: Secondary | ICD-10-CM

## 2022-06-22 NOTE — Progress Notes (Unsigned)
[place],acting as a Education administrator for [employer], PA-C.,have documented all relevant documentation on the behalf of Mardene Speak, PA-C,as directed by  [employer], PA-C while in the presence of [employer], PA-C.    Complete physical exam   Patient: Melinda Mack   DOB: 11-Jul-1951   71 y.o. Female  MRN: [id number] Visit Date: 06/22/2022  Today's healthcare provider: Mardene Speak, PA-C   No chief complaint on file.  Subjective    Melinda Mack is a 71 y.o. female who presents today for a complete physical exam.    Past Medical History:  Diagnosis Date   Cancer ([place]) 03/2012   uterine   History of chicken pox    History of measles    History of mumps    Past Surgical History:  Procedure Laterality Date   ABDOMINAL HYSTERECTOMY  04/18/2012   cancer at [facility]  03/2012   BREAST BIOPSY Left 05/08/2016   neg- [facility]   BREAST BIOPSY Right 10/2015   neg- [facility]   LYMPHADENECTOMY  03/2012   MANDIBLE FRACTURE SURGERY  1984   TMJ   TONSILLECTOMY AND ADENOIDECTOMY  1970   TUBAL LIGATION  1978   Social History   Socioeconomic History   Marital status: Widowed    Spouse name: Not on file   Number of children: 3   Years of education: 52   Highest education level: 12th grade  Occupational History    Comment: [employer]  Tobacco Use   Smoking status: Former    Packs/day: 0.50    Years: 9.00    Total pack years: 4.50    Types: Cigarettes    Quit date: 04/26/1992    Years since quitting: 30.1   Smokeless tobacco: Never   Tobacco comments:    [place]  Vaping Use   Vaping Use: Never used  Substance and Sexual Activity   Alcohol use: No    Alcohol/week: 0.0 standard drinks of alcohol   Drug use: No   Sexual activity: Not on file  Other Topics Concern   Not on file  Social History Narrative   Widowed   Caffeine use- coffee - 2 cups daily   Social Determinants of Health   Financial Resource  Strain: Low Risk  (12/27/2021)   Overall Financial Resource Strain (CARDIA)    Difficulty of Paying Living Expenses: Not hard at all  Food Insecurity: No Food Insecurity (12/27/2021)   Hunger Vital Sign    Worried About Running Out of Food in the Last Year: Never true    Ran Out of Food in the Last Year: Never true  Transportation Needs: No Transportation Needs (12/27/2021)   PRAPARE - Hydrologist (Medical): No    Lack of Transportation (Non-Medical): No  Physical Activity: Insufficiently Active (12/27/2021)   Exercise Vital Sign    Days of Exercise per Week: 3 days    Minutes of Exercise per Session: 30 min  Stress: No Stress Concern Present (12/27/2021)   [place]    Feeling of Stress : Only a little  Social Connections: Moderately Integrated (12/27/2021)   Social Connection and Isolation Panel [NHANES]    Frequency of Communication with Friends and Family: More than three times a week    Frequency of Social Gatherings with Friends and Family: More than three times a week    Attends Religious Services: More than 4 times per  year    Active Member of Clubs or Organizations: Yes    Attends Archivist Meetings: More than 4 times per year    Marital Status: Widowed  Intimate Partner Violence: Not At Risk (12/27/2021)   Humiliation, Afraid, Rape, and Kick questionnaire    Fear of Current or Ex-Partner: No    Emotionally Abused: No    Physically Abused: No    Sexually Abused: No   Family Status  Relation Name Status   Mother  Alive   Father  Alive   Sister 1 Alive   Sister 2 Alive   MGM  Deceased   MGF  Deceased   PGM  Deceased   PGF  Deceased   Other  (Not Specified)   Administrator  (Not Specified)   Field seismologist  (Not Specified)   Administrator  (Not Specified)   Family History  Problem Relation Age of Onset   Lung disease Mother    COPD Mother    Hypertension Mother    Heart disease  Mother    Lung disease Father    Healthy Sister    Healthy Sister    Heart attack Maternal Grandmother    Alzheimer's disease Paternal Grandmother    Coronary artery disease Other    Hypertension Maternal Uncle    Breast cancer Paternal Aunt 81   Lung cancer Maternal Uncle 74   Allergies  Allergen Reactions   Codeine Hives   Penicillins Swelling   Tetracycline Hives   Sulfa Antibiotics     Other reaction(s): Skin Rashes    Patient Care Team: Birdie Sons, MD as PCP - General (Family Medicine) Anell Barr, OD as Consulting Physician (Optometry)   Medications: Outpatient Medications Prior to Visit  Medication Sig   levothyroxine (SYNTHROID) 50 MCG tablet Take 1 tablet by mouth once daily   lovastatin (MEVACOR) 40 MG tablet TAKE 1 TABLET BY MOUTH AT BEDTIME (APPOINTMENT  REQUIRED  FOR  FUTURE  REFILLS)   Multiple Vitamin (MULTIVITAMIN) capsule Take 1 capsule by mouth daily.   No facility-administered medications prior to visit.    Review of Systems  Constitutional:  Positive for fatigue.  HENT:  Positive for nosebleeds and voice change.   Eyes:  Positive for itching.  Respiratory: Negative.    Cardiovascular: Negative.   Gastrointestinal:  Positive for abdominal distention, constipation and diarrhea.  Endocrine: Positive for heat intolerance.  Genitourinary: Negative.   Musculoskeletal: Negative.   Skin: Negative.   Allergic/Immunologic: Positive for environmental allergies.  Neurological:  Positive for light-headedness.  Hematological: Negative.   Psychiatric/Behavioral: Negative.      {Labs  Heme  Chem  Endocrine  Serology  Results Review (optional):23779}  Objective     BP 115/67 (BP Location: Right Arm, Patient Position: Sitting, Cuff Size: Normal)   Pulse 68   Temp 98.1 F (36.7 C) (Oral)   Wt 167 lb (75.8 kg)   SpO2 98%   BMI 27.79 kg/m     Physical Exam Constitutional:      Appearance: Normal appearance. She is normal weight.  HENT:      Head: Normocephalic and atraumatic.     Right Ear: Tympanic membrane, ear canal and external ear normal.     Left Ear: Tympanic membrane, ear canal and external ear normal.     Nose: Nose normal.     Mouth/Throat:     Mouth: Mucous membranes are moist.     Pharynx: Oropharynx is clear.  Eyes:  Extraocular Movements: Extraocular movements intact.     Conjunctiva/sclera: Conjunctivae normal.     Pupils: Pupils are equal, round, and reactive to light.  Cardiovascular:     Rate and Rhythm: Normal rate and regular rhythm.     Pulses: Normal pulses.     Heart sounds: Normal heart sounds.  Pulmonary:     Effort: Pulmonary effort is normal.     Breath sounds: Normal breath sounds.  Abdominal:     General: Abdomen is flat. Bowel sounds are normal.     Palpations: Abdomen is soft.  Musculoskeletal:        General: Normal range of motion.     Cervical back: Normal range of motion and neck supple.  Skin:    General: Skin is warm and dry.  Neurological:     General: No focal deficit present.     Mental Status: She is alert and oriented to person, place, and time. Mental status is at baseline.  Psychiatric:        Mood and Affect: Mood normal.        Behavior: Behavior normal.        Thought Content: Thought content normal.        Judgment: Judgment normal.       Last depression screening scores    06/22/2022    9:04 AM 12/27/2021    2:26 PM 02/21/2021    1:39 PM  PHQ 2/9 Scores  PHQ - 2 Score 0 1 0  PHQ- 9 Score 1     Last fall risk screening    06/22/2022    9:04 AM  Fall Risk   Falls in the past year? 0   Last Audit-C alcohol use screening    06/22/2022    9:04 AM  Alcohol Use Disorder Test (AUDIT)  1. How often do you have a drink containing alcohol? 0  2. How many drinks containing alcohol do you have on a typical day when you are drinking? 0  3. How often do you have six or more drinks on one occasion? 0  AUDIT-C Score 0   A score of 3 or more in women, and 4  or more in men indicates increased risk for alcohol abuse, EXCEPT if all of the points are from question 1   No results found for any visits on 06/22/22.  Assessment & Plan    Routine Health Maintenance and Physical Exam  Exercise Activities and Dietary recommendations  Goals      Cut out extra servings     Recommend cutting out junk food and sweets in daily diet. Pt to focus on protein snacks in between meals.      DIET - EAT MORE FRUITS AND VEGETABLES     DIET - REDUCE SUGAR INTAKE     Recommend cutting out all desserts and junk food and substituting for fruits.         Immunization History  Administered Date(s) Administered   Fluad Quad(high Dose 65+) 12/24/2019   Influenza, High Dose Seasonal PF 07/24/2018   Influenza,inj,Quad PF,6+ Mos 06/16/2015, 08/01/2016   Influenza-Unspecified 08/18/2017   Pneumococcal Conjugate-13 12/06/2016   Pneumococcal Polysaccharide-23 07/24/2018   Tdap 01/21/2009   Zoster, Live 08/28/2011    Health Maintenance  Topic Date Due   COVID-19 Vaccine (1) Never done   Zoster Vaccines- Shingrix (1 of 2) Never done   TETANUS/TDAP  01/22/2019   MAMMOGRAM  08/18/2021   INFLUENZA VACCINE  05/23/2022   DEXA SCAN  08/19/2023   Fecal DNA (Cologuard)  04/12/2024   Pneumonia Vaccine 26+ Years old  Completed   Hepatitis C Screening  Completed   HPV VACCINES  Aged Out    Discussed health benefits of physical activity, and encouraged her to engage in regular exercise appropriate for her age and condition.  1. Annual physical exam ***  2. Hypercholesterolemia without hypertriglyceridemia ***  3. Adult hypothyroidism ***  4. Pre-diabetes ***   No follow-ups on file.     The patient was advised to call back or seek an in-person evaluation if the symptoms worsen or if the condition fails to improve as anticipated.  I discussed the assessment and treatment plan with the patient. The patient was provided an opportunity to ask questions and  all were answered. The patient agreed with the plan and demonstrated an understanding of the instructions.  The entirety of the information documented in the History of Present Illness, Review of Systems and Physical Exam were personally obtained by me. Portions of this information were initially documented by the CMA and reviewed by me for thoroughness and accuracy.  Portions of this note were created using dictation software and may contain typographical errors.    Mardene Speak, PA-C  [facility] [phone] (phone) [phone] (fax)  [place]

## 2022-07-13 ENCOUNTER — Other Ambulatory Visit: Payer: Self-pay | Admitting: Family Medicine

## 2022-07-13 DIAGNOSIS — E7800 Pure hypercholesterolemia, unspecified: Secondary | ICD-10-CM

## 2022-08-05 ENCOUNTER — Other Ambulatory Visit: Payer: Self-pay | Admitting: Family Medicine

## 2022-11-23 DIAGNOSIS — H1045 Other chronic allergic conjunctivitis: Secondary | ICD-10-CM | POA: Diagnosis not present

## 2022-11-23 DIAGNOSIS — H43819 Vitreous degeneration, unspecified eye: Secondary | ICD-10-CM | POA: Diagnosis not present

## 2022-11-23 DIAGNOSIS — H2513 Age-related nuclear cataract, bilateral: Secondary | ICD-10-CM | POA: Diagnosis not present

## 2022-11-23 DIAGNOSIS — H40013 Open angle with borderline findings, low risk, bilateral: Secondary | ICD-10-CM | POA: Diagnosis not present

## 2022-11-23 DIAGNOSIS — H04123 Dry eye syndrome of bilateral lacrimal glands: Secondary | ICD-10-CM | POA: Diagnosis not present

## 2022-12-18 ENCOUNTER — Telehealth: Payer: Self-pay | Admitting: Family Medicine

## 2022-12-18 NOTE — Telephone Encounter (Signed)
Contacted Banita Brame Willcox to schedule their annual wellness visit. Appointment made for 01/31/2023.  [place] Direct Dial: [phone]

## 2023-01-18 ENCOUNTER — Ambulatory Visit
Admission: RE | Admit: 2023-01-18 | Discharge: 2023-01-18 | Disposition: A | Discharge status: Home / Self Care | Payer: PPO | Source: Ambulatory Visit | Attending: Physician Assistant | Admitting: Physician Assistant

## 2023-01-18 ENCOUNTER — Encounter: Payer: Self-pay | Admitting: Physician Assistant

## 2023-01-18 ENCOUNTER — Ambulatory Visit (INDEPENDENT_AMBULATORY_CARE_PROVIDER_SITE_OTHER): Payer: PPO | Admitting: Physician Assistant

## 2023-01-18 ENCOUNTER — Ambulatory Visit
Admission: RE | Admit: 2023-01-18 | Discharge: 2023-01-18 | Disposition: A | Discharge status: Home / Self Care | Payer: PPO | Attending: Physician Assistant | Admitting: Physician Assistant

## 2023-01-18 VITALS — BP 126/72 | HR 77 | Temp 98.6°F | Resp 13 | Ht 64.0 in | Wt 171.5 lb

## 2023-01-18 DIAGNOSIS — J0100 Acute maxillary sinusitis, unspecified: Secondary | ICD-10-CM

## 2023-01-18 DIAGNOSIS — M19042 Primary osteoarthritis, left hand: Secondary | ICD-10-CM | POA: Diagnosis not present

## 2023-01-18 DIAGNOSIS — M79645 Pain in left finger(s): Secondary | ICD-10-CM

## 2023-01-18 DIAGNOSIS — M7989 Other specified soft tissue disorders: Secondary | ICD-10-CM | POA: Diagnosis not present

## 2023-01-18 MED ORDER — CLINDAMYCIN HCL 300 MG PO CAPS: 300.0000 mg | ORAL_CAPSULE | Freq: Four times a day (QID) | ORAL | 0 refills | Status: AC

## 2023-01-18 NOTE — Progress Notes (Signed)
     I,J'ya E Hunter,acting as a scribe for [employer], PA-C.,have documented all relevant documentation on the behalf of Mikey Kirschner, PA-C,as directed by  Mikey Kirschner, PA-C while in the presence of Mikey Kirschner, PA-C.   Established patient visit   Patient: Melinda Mack   DOB: Aug 19, 1951   72 y.o. Female  MRN: [id number] Visit Date: 01/18/2023  Today's healthcare provider: Mikey Kirschner, PA-C   Chief Complaint  Patient presents with   Mass    Swollen Finger   Subjective     Pt reports pain, redness, itchiness, throbbing, and edema on her left index finger x 1 week. She does not recall injury. She has taken tylenol, tried heat and not moving her finger. Medications: Outpatient Medications Prior to Visit  Medication Sig   levothyroxine (SYNTHROID) 50 MCG tablet Take 1 tablet by mouth once daily   lovastatin (MEVACOR) 40 MG tablet Take 1 tablet (40 mg total) by mouth every evening. TAKE 1 TABLET BY MOUTH ONCE DAILY AT BEDTIME . APPOINTMENT REQUIRED FOR FUTURE REFILLS   Multiple Vitamin (MULTIVITAMIN) capsule Take 1 capsule by mouth daily.   No facility-administered medications prior to visit.    Review of Systems  Gastrointestinal:  Positive for nausea.  Musculoskeletal:  Positive for joint swelling. Neck pain: as a result of the pain. Neurological:  Positive for numbness and headaches. Negative for weakness.      Objective    BP 126/72 (BP Location: Right Arm, Patient Position: Sitting, Cuff Size: Large)   Pulse 77   Temp 98.6 F (37 C) (Oral)   Resp 13   Ht 5\' 4"  (1.626 m)   Wt 171 lb 8 oz (77.8 kg)   SpO2 97%   BMI 29.44 kg/m   Physical Exam Vitals reviewed.  Constitutional:      Appearance: She is not ill-appearing.  HENT:     Head: Normocephalic.  Eyes:     Conjunctiva/sclera: Conjunctivae normal.  Cardiovascular:     Rate and Rhythm: Normal rate.  Pulmonary:     Effort: Pulmonary effort is normal. No respiratory distress.  Skin:     Comments: Left index finger PIP with a flucutant, erythematous nodule. Overall edema in finger  Neurological:     General: No focal deficit present.     Mental Status: She is alert and oriented to person, place, and time.  Psychiatric:        Mood and Affect: Mood normal.        Behavior: Behavior normal.     No results found for any visits on 01/18/23.  Assessment & Plan     Cellulitis vs abscess left index finger Rx clindamycin qid x 5 days Advised warm compresses Ordered xray L index finger  Return if symptoms worsen or fail to improve.      I, Mikey Kirschner, PA-C have reviewed all documentation for this visit. The documentation on 01/18/23  for the exam, diagnosis, procedures, and orders are all accurate and complete.  Mikey Kirschner, PA-C [facility] [address] [place], [place], [address] Office: [phone] Fax: [place]

## 2023-01-31 ENCOUNTER — Ambulatory Visit (INDEPENDENT_AMBULATORY_CARE_PROVIDER_SITE_OTHER): Payer: PPO

## 2023-01-31 VITALS — Ht 65.0 in | Wt 171.0 lb

## 2023-01-31 DIAGNOSIS — Z0000 Encounter for general adult medical examination without abnormal findings: Secondary | ICD-10-CM | POA: Diagnosis not present

## 2023-01-31 NOTE — Progress Notes (Signed)
I connected with  Melinda Mack on 01/31/23 by a audio enabled telemedicine application and verified that I am speaking with the correct person using two identifiers.  Patient Location: Home  Provider Location: Office/Clinic  I discussed the limitations of evaluation and management by telemedicine. The patient expressed understanding and agreed to proceed.  Subjective:   Melinda Mack is a 72 y.o. female who presents for Medicare Annual (Subsequent) preventive examination.  Review of Systems           Objective:    There were no vitals filed for this visit. There is no height or weight on file to calculate BMI.     12/27/2021    2:27 PM 06/03/2020    8:42 AM 06/03/2019    8:46 AM 05/24/2018    9:17 AM 04/24/2017   10:02 AM 07/05/2015    9:05 AM  Advanced Directives  Does Patient Have a Medical Advance Directive? No Yes Yes No No Yes  Type of Special educational needs teacher of [place];Living will Living will;Healthcare Power of Attorney     Copy of Healthcare Power of Attorney in Chart?  No - copy requested No - copy requested   No - copy requested  Would patient like information on creating a medical advance directive? No - Patient declined   Yes (MAU/Ambulatory/Procedural Areas - Information given) Yes (ED - Information included in AVS)     Current Medications (verified) Outpatient Encounter Medications as of 01/31/2023  Medication Sig   levothyroxine (SYNTHROID) 50 MCG tablet Take 1 tablet by mouth once daily   lovastatin (MEVACOR) 40 MG tablet Take 1 tablet (40 mg total) by mouth every evening. TAKE 1 TABLET BY MOUTH ONCE DAILY AT BEDTIME . APPOINTMENT REQUIRED FOR FUTURE REFILLS   Multiple Vitamin (MULTIVITAMIN) capsule Take 1 capsule by mouth daily.   No facility-administered encounter medications on file as of 01/31/2023.    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   History of chicken pox    History of measles    History of mumps    Past Surgical History:  Procedure Laterality Date   ABDOMINAL HYSTERECTOMY  04/18/2012   [facility] at [facility]   BILATERAL SALPINGOOPHORECTOMY  03/2012   BREAST BIOPSY Left 05/08/2016   neg- [place]   BREAST BIOPSY Right 10/2015   neg- [place]   LYMPHADENECTOMY  03/2012   MANDIBLE FRACTURE SURGERY  1984   TMJ   TONSILLECTOMY AND ADENOIDECTOMY  1970   TUBAL LIGATION  1978   Family History  Problem Relation Age of Onset   Lung disease Mother    COPD Mother    Hypertension Mother    Heart disease Mother    Lung disease Father    Healthy Sister    Healthy Sister    Heart attack Maternal Grandmother    Alzheimer's disease Paternal Grandmother    Coronary artery disease Other    Hypertension Maternal Uncle    Breast cancer Paternal Aunt 57   Lung cancer Maternal Uncle 43   Social History   Socioeconomic History   Marital status: Widowed    Spouse name: Not on file   Number of children: 3   Years of education: 53   Highest education level: 12th grade  Occupational History    Comment: Film/video editor  Tobacco Use   Smoking status: Former    Packs/day: 0.50  Years: 9.00    Additional pack years: 0.00    Total pack years: 4.50    Types: Cigarettes    Quit date: 04/26/1992    Years since quitting: 30.7   Smokeless tobacco: Never   Tobacco comments:    QUIT IN 1996  Vaping Use   Vaping Use: Never used  Substance and Sexual Activity   Alcohol use: No    Alcohol/week: 0.0 standard drinks of alcohol   Drug use: No   Sexual activity: Not on file  Other Topics Concern   Not on file  Social History Narrative   Widowed   Caffeine use- coffee - 2 cups daily   Social Determinants of Health   Financial Resource Strain: Low Risk  (12/27/2021)   Overall Financial Resource Strain (CARDIA)    Difficulty of Paying Living Expenses: Not hard at all  Food Insecurity: No Food  Insecurity (12/27/2021)   Hunger Vital Sign    Worried About Running Out of Food in the Last Year: Never true    Ran Out of Food in the Last Year: Never true  Transportation Needs: No Transportation Needs (12/27/2021)   PRAPARE - Administrator, Civil Service (Medical): No    Lack of Transportation (Non-Medical): No  Physical Activity: Insufficiently Active (12/27/2021)   Exercise Vital Sign    Days of Exercise per Week: 3 days    Minutes of Exercise per Session: 30 min  Stress: No Stress Concern Present (12/27/2021)   [employer] of Occupational Health - Occupational Stress Questionnaire    Feeling of Stress : Only a little  Social Connections: Moderately Integrated (12/27/2021)   Social Connection and Isolation Panel [NHANES]    Frequency of Communication with Friends and Family: More than three times a week    Frequency of Social Gatherings with Friends and Family: More than three times a week    Attends Religious Services: More than 4 times per year    Active Member of [employer] or Organizations: Yes    Attends Banker Meetings: More than 4 times per year    Marital Status: Widowed    Tobacco Counseling Counseling given: Not Answered Tobacco comments: QUIT IN 1996   Clinical Intake:                 Diabetic?no         Activities of Daily Living    01/18/2023    9:36 AM 06/22/2022    9:04 AM  In your present state of health, do you have any difficulty performing the following activities:  Hearing? 0 0  Vision? 0 0  Difficulty concentrating or making decisions? 0 0  Walking or climbing stairs? 0 0  Dressing or bathing? 0 0  Doing errands, shopping? 0 0    Patient Care Team: Malva Limes, MD as PCP - General (Family Medicine) Isla Pence, OD as Consulting Physician (Optometry)  Indicate any recent Medical Services you may have received from other than [facility] providers in the past year (date may be approximate).     Assessment:    This is a routine wellness examination for Melinda Mack.  Hearing/Vision screen No results found.  Dietary issues and exercise activities discussed:     Goals Addressed   None    Depression Screen    01/18/2023    9:35 AM 06/22/2022    9:04 AM 12/27/2021    2:26 PM 02/21/2021    1:39 PM 06/03/2020    8:40  AM 06/03/2019    8:47 AM 05/24/2018    9:16 AM  PHQ 2/9 Scores  PHQ - 2 Score 0 0 1 0 0 1 1  PHQ- 9 Score 1 1         Fall Risk    01/18/2023    9:36 AM 01/18/2023    9:35 AM 01/18/2023    9:33 AM 06/22/2022    9:04 AM 12/27/2021    2:28 PM  Fall Risk   Falls in the past year? 0 0 0 0 0  Number falls in past yr: 0    0  Injury with Fall? 0 0 0  0  Risk for fall due to : No Fall Risks    No Fall Risks  Follow up     Falls evaluation completed    FALL RISK PREVENTION PERTAINING TO THE HOME:  Any stairs in or around the home? No  If so, are there any without handrails? No  Home free of loose throw rugs in walkways, pet beds, electrical cords, etc? Yes  Adequate lighting in your home to reduce risk of falls? Yes   ASSISTIVE DEVICES UTILIZED TO PREVENT FALLS:  Life alert? No  Use of a cane, walker or w/c? No  Grab bars in the bathroom? Yes  Shower chair or bench in shower? No  Elevated toilet seat or a handicapped toilet? No   Cognitive Function:        04/24/2017   10:08 AM  6CIT Screen  What Year? 0 points  What month? 0 points  What time? 0 points  Count back from 20 0 points  Months in reverse 0 points  Repeat phrase 0 points  Total Score 0 points    Immunizations Immunization History  Administered Date(s) Administered   Fluad Quad(high Dose 65+) 12/24/2019   Influenza, High Dose Seasonal PF 07/24/2018   Influenza,inj,Quad PF,6+ Mos 06/16/2015, 08/01/2016   Influenza-Unspecified 08/18/2017   Pneumococcal Conjugate-13 12/06/2016   Pneumococcal Polysaccharide-23 07/24/2018   Tdap 01/21/2009   Zoster, Live 08/28/2011    TDAP status: Up to date  Flu  Vaccine status: Up to date  Pneumococcal vaccine status: Up to date  Covid-19 vaccine status: Declined, Education has been provided regarding the importance of this vaccine but patient still declined. Advised may receive this vaccine at local pharmacy or Health Dept.or vaccine clinic. Aware to provide a copy of the vaccination record if obtained from local pharmacy or Health Dept. Verbalized acceptance and understanding.  Qualifies for Shingles Vaccine? Yes   Zostavax completed No   Shingrix Completed?: No.    Education has been provided regarding the importance of this vaccine. Patient has been advised to call insurance company to determine out of pocket expense if they have not yet received this vaccine. Advised may also receive vaccine at local pharmacy or Health Dept. Verbalized acceptance and understanding.  Screening Tests Health Maintenance  Topic Date Due   COVID-19 Vaccine (1) Never done   Zoster Vaccines- Shingrix (1 of 2) Never done   DTaP/Tdap/Td (2 - Td or Tdap) 01/22/2019   MAMMOGRAM  08/18/2021   Medicare Annual Wellness (AWV)  12/28/2022   INFLUENZA VACCINE  05/24/2023   DEXA SCAN  08/19/2023   Fecal DNA (Cologuard)  04/12/2024   Pneumonia Vaccine 7665+ Years old  Completed   Hepatitis C Screening  Completed   HPV VACCINES  Aged Out    Health Maintenance  Health Maintenance Due  Topic Date Due   COVID-19 Vaccine (  1) Never done   Zoster Vaccines- Shingrix (1 of 2) Never done   DTaP/Tdap/Td (2 - Td or Tdap) 01/22/2019   MAMMOGRAM  08/18/2021   Medicare Annual Wellness (AWV)  12/28/2022    Colorectal cancer screening: Type of screening: Cologuard. Completed yes. Repeat every 3 years  Mammogram status: Completed no. Repeat every year pt declines  Bone Density status: Completed yes. Results reflect: Bone density results: OSTEOPENIA. Repeat every 5 years.  Lung Cancer Screening: (Low Dose CT Chest recommended if Age 38-80 years, 30 pack-year currently smoking OR  have quit w/in 15years.) does not qualify.   Lung Cancer Screening Referral: no  Additional Screening:  Hepatitis C Screening: does not qualify; Completed yes  Vision Screening: Recommended annual ophthalmology exams for early detection of glaucoma and other disorders of the eye. Is the patient up to date with their annual eye exam?  Yes  Who is the provider or what is the name of the office in which the patient attends annual eye exams? Dr Clydene Pugh If pt is not established with a provider, would they like to be referred to a provider to establish care? No .   Dental Screening: Recommended annual dental exams for proper oral hygiene  Community Resource Referral / Chronic Care Management: CRR required this visit?  No   CCM required this visit?  No      Plan:     I have personally reviewed and noted the following in the patient's chart:   Medical and social history Use of alcohol, tobacco or illicit drugs  Current medications and supplements including opioid prescriptions. Patient is not currently taking opioid prescriptions. Functional ability and status Nutritional status Physical activity Advanced directives List of other physicians Hospitalizations, surgeries, and ER visits in previous 12 months Vitals Screenings to include cognitive, depression, and falls Referrals and appointments  In addition, I have reviewed and discussed with patient certain preventive protocols, quality metrics, and best practice recommendations. A written personalized care plan for preventive services as well as general preventive health recommendations were provided to patient.     Sue Lush, LPN   1/61/0960   Nurse Notes: The patient states they are doing well and has no concerns or questions at this time.

## 2023-01-31 NOTE — Patient Instructions (Signed)
Melinda Mack , Thank you for taking time to come for your Medicare Wellness Visit. I appreciate your ongoing commitment to your health goals. Please review the following plan we discussed and let me know if I can assist you in the future.   These are the goals we discussed:  Goals      Cut out extra servings     Recommend cutting out junk food and sweets in daily diet. Pt to focus on protein snacks in between meals.      DIET - EAT MORE FRUITS AND VEGETABLES     DIET - REDUCE SUGAR INTAKE     Recommend cutting out all desserts and junk food and substituting for fruits.         This is a list of the screening recommended for you and due dates:  Health Maintenance  Topic Date Due   COVID-19 Vaccine (1) Never done   Zoster (Shingles) Vaccine (1 of 2) Never done   DTaP/Tdap/Td vaccine (2 - Td or Tdap) 01/22/2019   Mammogram  08/18/2021   Flu Shot  05/24/2023   DEXA scan (bone density measurement)  08/19/2023   Medicare Annual Wellness Visit  01/31/2024   Cologuard (Stool DNA test)  04/12/2024   Pneumonia Vaccine  Completed   Hepatitis C Screening: USPSTF Recommendation to screen - Ages 46-79 yo.  Completed   HPV Vaccine  Aged Out    Advanced directives: yes  Conditions/risks identified: none  Next appointment: Follow up in one year for your annual wellness visit 02/05/2024 @2 :30pm telephone   Preventive Care 65 Years and Older, Female Preventive care refers to lifestyle choices and visits with your health care provider that can promote health and wellness. What does preventive care include? A yearly physical exam. This is also called an annual well check. Dental exams once or twice a year. Routine eye exams. Ask your health care provider how often you should have your eyes checked. Personal lifestyle choices, including: Daily care of your teeth and gums. Regular physical activity. Eating a healthy diet. Avoiding tobacco and drug use. Limiting alcohol use. Practicing safe  sex. Taking low-dose aspirin every day. Taking vitamin and mineral supplements as recommended by your health care provider. What happens during an annual well check? The services and screenings done by your health care provider during your annual well check will depend on your age, overall health, lifestyle risk factors, and family history of disease. Counseling  Your health care provider may ask you questions about your: Alcohol use. Tobacco use. Drug use. Emotional well-being. Home and relationship well-being. Sexual activity. Eating habits. History of falls. Memory and ability to understand (cognition). Work and work Astronomer. Reproductive health. Screening  You may have the following tests or measurements: Height, weight, and BMI. Blood pressure. Lipid and cholesterol levels. These may be checked every 5 years, or more frequently if you are over 80 years old. Skin check. Lung cancer screening. You may have this screening every year starting at age 33 if you have a 30-pack-year history of smoking and currently smoke or have quit within the past 15 years. Fecal occult blood test (FOBT) of the stool. You may have this test every year starting at age 40. Flexible sigmoidoscopy or colonoscopy. You may have a sigmoidoscopy every 5 years or a colonoscopy every 10 years starting at age 68. Hepatitis C blood test. Hepatitis B blood test. Sexually transmitted disease (STD) testing. Diabetes screening. This is done by checking your blood sugar (glucose) after  you have not eaten for a while (fasting). You may have this done every 1-3 years. Bone density scan. This is done to screen for osteoporosis. You may have this done starting at age 1. Mammogram. This may be done every 1-2 years. Talk to your health care provider about how often you should have regular mammograms. Talk with your health care provider about your test results, treatment options, and if necessary, the need for more  tests. Vaccines  Your health care provider may recommend certain vaccines, such as: Influenza vaccine. This is recommended every year. Tetanus, diphtheria, and acellular pertussis (Tdap, Td) vaccine. You may need a Td booster every 10 years. Zoster vaccine. You may need this after age 57. Pneumococcal 13-valent conjugate (PCV13) vaccine. One dose is recommended after age 19. Pneumococcal polysaccharide (PPSV23) vaccine. One dose is recommended after age 56. Talk to your health care provider about which screenings and vaccines you need and how often you need them. This information is not intended to replace advice given to you by your health care provider. Make sure you discuss any questions you have with your health care provider. Document Released: 11/05/2015 Document Revised: 06/28/2016 Document Reviewed: 08/10/2015 Elsevier Interactive Patient Education  2017 [place] Prevention in the Home Falls can cause injuries. They can happen to people of all ages. There are many things you can do to make your home safe and to help prevent falls. What can I do on the outside of my home? Regularly fix the edges of walkways and driveways and fix any cracks. Remove anything that might make you trip as you walk through a door, such as a raised step or threshold. Trim any bushes or trees on the path to your home. Use bright outdoor lighting. Clear any walking paths of anything that might make someone trip, such as rocks or tools. Regularly check to see if handrails are loose or broken. Make sure that both sides of any steps have handrails. Any raised decks and porches should have guardrails on the edges. Have any leaves, snow, or ice cleared regularly. Use sand or salt on walking paths during winter. Clean up any spills in your garage right away. This includes oil or grease spills. What can I do in the bathroom? Use night lights. Install grab bars by the toilet and in the tub and shower.  Do not use towel bars as grab bars. Use non-skid mats or decals in the tub or shower. If you need to sit down in the shower, use a plastic, non-slip stool. Keep the floor dry. Clean up any water that spills on the floor as soon as it happens. Remove soap buildup in the tub or shower regularly. Attach bath mats securely with double-sided non-slip rug tape. Do not have throw rugs and other things on the floor that can make you trip. What can I do in the bedroom? Use night lights. Make sure that you have a light by your bed that is easy to reach. Do not use any sheets or blankets that are too big for your bed. They should not hang down onto the floor. Have a firm chair that has side arms. You can use this for support while you get dressed. Do not have throw rugs and other things on the floor that can make you trip. What can I do in the kitchen? Clean up any spills right away. Avoid walking on wet floors. Keep items that you use a lot in easy-to-reach places. If you  need to reach something above you, use a strong step stool that has a grab bar. Keep electrical cords out of the way. Do not use floor polish or wax that makes floors slippery. If you must use wax, use non-skid floor wax. Do not have throw rugs and other things on the floor that can make you trip. What can I do with my stairs? Do not leave any items on the stairs. Make sure that there are handrails on both sides of the stairs and use them. Fix handrails that are broken or loose. Make sure that handrails are as long as the stairways. Check any carpeting to make sure that it is firmly attached to the stairs. Fix any carpet that is loose or worn. Avoid having throw rugs at the top or bottom of the stairs. If you do have throw rugs, attach them to the floor with carpet tape. Make sure that you have a light switch at the top of the stairs and the bottom of the stairs. If you do not have them, ask someone to add them for you. What else  can I do to help prevent falls? Wear shoes that: Do not have high heels. Have rubber bottoms. Are comfortable and fit you well. Are closed at the toe. Do not wear sandals. If you use a stepladder: Make sure that it is fully opened. Do not climb a closed stepladder. Make sure that both sides of the stepladder are locked into place. Ask someone to hold it for you, if possible. Clearly mark and make sure that you can see: Any grab bars or handrails. First and last steps. Where the edge of each step is. Use tools that help you move around (mobility aids) if they are needed. These include: Canes. Walkers. Scooters. Crutches. Turn on the lights when you go into a dark area. Replace any light bulbs as soon as they burn out. Set up your furniture so you have a clear path. Avoid moving your furniture around. If any of your floors are uneven, fix them. If there are any pets around you, be aware of where they are. Review your medicines with your doctor. Some medicines can make you feel dizzy. This can increase your chance of falling. Ask your doctor what other things that you can do to help prevent falls. This information is not intended to replace advice given to you by your health care provider. Make sure you discuss any questions you have with your health care provider. Document Released: 08/05/2009 Document Revised: 03/16/2016 Document Reviewed: 11/13/2014 Elsevier Interactive Patient Education  2017 [employer].

## 2023-07-04 ENCOUNTER — Other Ambulatory Visit: Payer: Self-pay | Admitting: Family Medicine

## 2023-07-04 DIAGNOSIS — E7800 Pure hypercholesterolemia, unspecified: Secondary | ICD-10-CM

## 2023-07-24 ENCOUNTER — Other Ambulatory Visit: Payer: Self-pay | Admitting: Family Medicine

## 2023-07-24 NOTE — Telephone Encounter (Signed)
Requested Prescriptions  Pending Prescriptions Disp Refills   levothyroxine (SYNTHROID) 50 MCG tablet [Pharmacy Med Name: Levothyroxine Sodium 50 MCG Oral Tablet] 90 tablet 1    Sig: Take 1 tablet by mouth once daily     Endocrinology:  Hypothyroid Agents Failed - 07/24/2023  6:42 AM      Failed - TSH in normal range and within 360 days    TSH  Date Value Ref Range Status  05/10/2022 1.830 0.450 - 4.500 uIU/mL Final         Passed - Valid encounter within last 12 months    Recent Outpatient Visits           6 months ago Finger pain, left   [place] [facility] Alfredia Ferguson, PA-C   1 year ago Annual physical exam   [facility] [place], [place], PA-C   1 year ago Hypercholesterolemia without hypertriglyceridemia   [place] [facility] [place], [place], PA-C   2 years ago Annual physical exam   [facility] Malva Limes, MD   3 years ago Trigger point   [facility] [place], [place], [place]

## 2023-10-04 ENCOUNTER — Other Ambulatory Visit: Payer: Self-pay | Admitting: Family Medicine

## 2023-10-04 DIAGNOSIS — E7800 Pure hypercholesterolemia, unspecified: Secondary | ICD-10-CM

## 2023-10-04 NOTE — Telephone Encounter (Signed)
Requested medications are due for refill today.  yes  Requested medications are on the active medications list.  yes  Last refill. 07/04/2023 #90 0 rf  Future visit scheduled.   no  Notes to clinic.  Labs are expired.    Requested Prescriptions  Pending Prescriptions Disp Refills   lovastatin (MEVACOR) 40 MG tablet [Pharmacy Med Name: Lovastatin 40 MG Oral Tablet] 90 tablet 0    Sig: TAKE 1 TABLET BY MOUTH AT BEDTIME     Cardiovascular:  Antilipid - Statins 2 Failed - 10/04/2023 11:06 AM      Failed - Cr in normal range and within 360 days    Creatinine  Date Value Ref Range Status  04/23/2014 0.98 0.60 - 1.30 mg/dL Final   Creatinine, Ser  Date Value Ref Range Status  05/10/2022 0.94 0.57 - 1.00 mg/dL Final         Failed - Lipid Panel in normal range within the last 12 months    Cholesterol, Total  Date Value Ref Range Status  05/10/2022 172 100 - 199 mg/dL Final   LDL Chol Calc (NIH)  Date Value Ref Range Status  05/10/2022 92 0 - 99 mg/dL Final   HDL  Date Value Ref Range Status  05/10/2022 42 >39 mg/dL Final   Triglycerides  Date Value Ref Range Status  05/10/2022 223 (H) 0 - 149 mg/dL Final         Passed - Patient is not pregnant      Passed - Valid encounter within last 12 months    Recent Outpatient Visits           8 months ago Finger pain, left   [place] [facility] Alfredia Ferguson, PA-C   1 year ago Annual physical exam   [place] [facility] [place], [place], PA-C   1 year ago Hypercholesterolemia without hypertriglyceridemia    [facility] [place], [place], PA-C   2 years ago Annual physical exam   [facility] Malva Limes, MD   3 years ago Trigger point   [facility] [place], [place], [place]

## 2023-10-22 ENCOUNTER — Ambulatory Visit (INDEPENDENT_AMBULATORY_CARE_PROVIDER_SITE_OTHER): Payer: PPO | Admitting: Family Medicine

## 2023-10-22 ENCOUNTER — Encounter: Payer: Self-pay | Admitting: Family Medicine

## 2023-10-22 VITALS — BP 124/54 | HR 69 | Ht 65.0 in | Wt 172.0 lb

## 2023-10-22 DIAGNOSIS — M8580 Other specified disorders of bone density and structure, unspecified site: Secondary | ICD-10-CM

## 2023-10-22 DIAGNOSIS — E039 Hypothyroidism, unspecified: Secondary | ICD-10-CM

## 2023-10-22 DIAGNOSIS — Z1231 Encounter for screening mammogram for malignant neoplasm of breast: Secondary | ICD-10-CM

## 2023-10-22 DIAGNOSIS — M25541 Pain in joints of right hand: Secondary | ICD-10-CM | POA: Diagnosis not present

## 2023-10-22 DIAGNOSIS — R0609 Other forms of dyspnea: Secondary | ICD-10-CM | POA: Diagnosis not present

## 2023-10-22 DIAGNOSIS — R7303 Prediabetes: Secondary | ICD-10-CM

## 2023-10-22 DIAGNOSIS — E7800 Pure hypercholesterolemia, unspecified: Secondary | ICD-10-CM | POA: Diagnosis not present

## 2023-10-22 DIAGNOSIS — M791 Myalgia, unspecified site: Secondary | ICD-10-CM

## 2023-10-22 DIAGNOSIS — M25542 Pain in joints of left hand: Secondary | ICD-10-CM

## 2023-10-22 NOTE — Patient Instructions (Signed)
Please review the attached list of medications and notify my office if there are any errors.   Please call the [facility] [phone]) to schedule a routine screening mammogram.

## 2023-10-22 NOTE — Progress Notes (Signed)
Established patient visit   Patient: Melinda Mack   DOB: February 07, 1951   72 y.o. Female  MRN: [id number] Visit Date: 10/22/2023  Today's healthcare provider: Mila Merry, MD   Chief Complaint  Patient presents with   Follow-up   Subjective    Discussed the use of AI scribe software for clinical note transcription with the patient, who gave verbal consent to proceed.  History of Present Illness   The patient, with a history of hypothyroidism and hyperlipidemia, presents with complaints of chronic fatigue, joint pains in hand, and muscle soreness. She reports feeling 'blah' and 'tired all the time,' similar to when she was first diagnosed with hypothyroidism twelve years ago. The fatigue is so severe that it limits her daily activities. She denies any sleep disturbances or unusual aches, apart from arthritis pains. She had xray of hand in April showing osteoarthritis, but since than her fingers have been persistent swollen with progressive joint deformities.  She also reports a skin condition that started in March, characterized by peeling skin and the development of hard knots. She is unsure if this is related to her osteoarthritis or if it contributes to her fatigue and muscle soreness.       Medications: Outpatient Medications Prior to Visit  Medication Sig   levothyroxine (SYNTHROID) 50 MCG tablet Take 1 tablet by mouth once daily   lovastatin (MEVACOR) 40 MG tablet TAKE 1 TABLET BY MOUTH ONCE DAILY IN THE EVENING (AT  BEDTIME)   Multiple Vitamin (MULTIVITAMIN) capsule Take 1 capsule by mouth daily.   No facility-administered medications prior to visit.   Review of Systems  Constitutional:  Positive for fatigue. Negative for appetite change, chills and fever.  Respiratory:  Positive for shortness of breath. Negative for chest tightness and wheezing.   Cardiovascular:  Negative for chest pain, palpitations and leg swelling.  Gastrointestinal:  Negative for abdominal  pain, nausea and vomiting.  Neurological:  Positive for weakness. Negative for dizziness.       Objective    BP (!) 124/54 (BP Location: Right Arm, Patient Position: Sitting, Cuff Size: Large)   Pulse 69   Ht 5\' 5"  (1.651 m)   Wt 172 lb (78 kg)   BMI 28.62 kg/m   Physical Exam   General: Appearance:    Well developed, well nourished female in no acute distress  Eyes:    PERRL, conjunctiva/corneas clear, EOM's intact       Lungs:     Clear to auscultation bilaterally, respirations unlabored  Heart:    Normal heart rate. Normal rhythm. No murmurs, rubs, or gallops.    MS:   All extremities are intact.  Diffuse swelling of bilateral hand  Ips with some joint deviation.  Neurologic:   Awake, alert, oriented x 3. No apparent focal neurological defect.         Assessment & Plan     1. Arthralgia of both hands (Primary)  - ANA w/Reflex if Positive - CYCLIC CITRUL PEPTIDE ANTIBODY, IGG/IGA - Rheumatoid factor - Sedimentation rate  2. Myalgia  - CK (Creatine Kinase)  3. Pre-diabetes  - Hemoglobin A1c  4. Hypercholesterolemia without hypertriglyceridemia She is tolerating lovastatin well with no adverse effects.   - CBC - Comprehensive metabolic panel - Lipid panel  5. Adult hypothyroidism  - TSH - T4, free - VITAMIN D 25 Hydroxy (Vit-D Deficiency, Fractures)  6. Osteopenia, unspecified location  - VITAMIN D 25 Hydroxy (Vit-D Deficiency, Fractures) - DG Bone Density;  Future  7. Dyspnea on exertion  - B Nat Peptide  8. Encounter for screening mammogram for malignant neoplasm of breast  - MM DIGITAL SCREENING BILATERAL; Future    Mila Merry, MD  [facility] [phone] (phone) [phone] (fax)  [facility]

## 2023-10-25 LAB — COMPREHENSIVE METABOLIC PANEL
ALT: 29 [IU]/L (ref 0–32)
AST: 22 [IU]/L (ref 0–40)
Albumin: 4.4 g/dL (ref 3.8–4.8)
Alkaline Phosphatase: 137 [IU]/L — ABNORMAL HIGH (ref 44–121)
BUN/Creatinine Ratio: 21 (ref 12–28)
BUN: 21 mg/dL (ref 8–27)
Bilirubin Total: 0.5 mg/dL (ref 0.0–1.2)
CO2: 22 mmol/L (ref 20–29)
Calcium: 9.7 mg/dL (ref 8.7–10.3)
Chloride: 107 mmol/L — ABNORMAL HIGH (ref 96–106)
Creatinine, Ser: 1.01 mg/dL — ABNORMAL HIGH (ref 0.57–1.00)
Globulin, Total: 2.4 g/dL (ref 1.5–4.5)
Glucose: 121 mg/dL — ABNORMAL HIGH (ref 70–99)
Potassium: 4.5 mmol/L (ref 3.5–5.2)
Sodium: 142 mmol/L (ref 134–144)
Total Protein: 6.8 g/dL (ref 6.0–8.5)
eGFR: 59 mL/min/{1.73_m2} — ABNORMAL LOW (ref >59)

## 2023-10-25 LAB — BRAIN NATRIURETIC PEPTIDE: BNP: 28.6 pg/mL (ref 0.0–100.0)

## 2023-10-25 LAB — CBC
Hematocrit: 41.4 % (ref 34.0–46.6)
Hemoglobin: 13.6 g/dL (ref 11.1–15.9)
MCH: 29.9 pg (ref 26.6–33.0)
MCHC: 32.9 g/dL (ref 31.5–35.7)
MCV: 91 fL (ref 79–97)
Platelets: 273 10*3/uL (ref 150–450)
RBC: 4.55 x10E6/uL (ref 3.77–5.28)
RDW: 13 % (ref 11.7–15.4)
WBC: 6.2 10*3/uL (ref 3.4–10.8)

## 2023-10-25 LAB — HEMOGLOBIN A1C
Est. average glucose Bld gHb Est-mCnc: 131 mg/dL
Hgb A1c MFr Bld: 6.2 % — ABNORMAL HIGH (ref 4.8–5.6)

## 2023-10-25 LAB — VITAMIN D 25 HYDROXY (VIT D DEFICIENCY, FRACTURES): Vit D, 25-Hydroxy: 35.2 ng/mL (ref 30.0–100.0)

## 2023-10-25 LAB — LIPID PANEL
Chol/HDL Ratio: 4 {ratio} (ref 0.0–4.4)
Cholesterol, Total: 178 mg/dL (ref 100–199)
HDL: 44 mg/dL (ref >39)
LDL Chol Calc (NIH): 106 mg/dL — ABNORMAL HIGH (ref 0–99)
Triglycerides: 157 mg/dL — ABNORMAL HIGH (ref 0–149)
VLDL Cholesterol Cal: 28 mg/dL (ref 5–40)

## 2023-10-25 LAB — RHEUMATOID FACTOR: Rheumatoid fact SerPl-aCnc: <10 [IU]/mL (ref <14.0)

## 2023-10-25 LAB — TSH: TSH: 1.85 u[IU]/mL (ref 0.450–4.500)

## 2023-10-25 LAB — CK: Total CK: 64 U/L (ref 32–182)

## 2023-10-25 LAB — SEDIMENTATION RATE: Sed Rate: 23 mm/h (ref 0–40)

## 2023-10-25 LAB — CYCLIC CITRUL PEPTIDE ANTIBODY, IGG/IGA: Cyclic Citrullin Peptide Ab: 4 U (ref 0–19)

## 2023-10-25 LAB — T4, FREE: Free T4: 1.41 ng/dL (ref 0.82–1.77)

## 2023-10-25 LAB — ANA W/REFLEX IF POSITIVE: Anti Nuclear Antibody (ANA): NEGATIVE

## 2023-10-30 ENCOUNTER — Other Ambulatory Visit: Payer: Self-pay | Admitting: Family Medicine

## 2023-10-30 DIAGNOSIS — E7800 Pure hypercholesterolemia, unspecified: Secondary | ICD-10-CM

## 2023-10-31 ENCOUNTER — Telehealth: Payer: Self-pay

## 2023-10-31 DIAGNOSIS — M25541 Pain in joints of right hand: Secondary | ICD-10-CM

## 2023-10-31 NOTE — Telephone Encounter (Signed)
 Copied from CRM [id number]. Topic: Referral - Request for Referral >> Oct 31, 2023  1:10 PM Phill Myron wrote: Rheumatologist referral needed at the suggestion of Dr Sherrie Mustache. Please advise

## 2023-12-19 DIAGNOSIS — M79641 Pain in right hand: Secondary | ICD-10-CM | POA: Diagnosis not present

## 2023-12-19 DIAGNOSIS — M79642 Pain in left hand: Secondary | ICD-10-CM | POA: Diagnosis not present

## 2023-12-21 DIAGNOSIS — M79641 Pain in right hand: Secondary | ICD-10-CM | POA: Diagnosis not present

## 2023-12-21 DIAGNOSIS — M79642 Pain in left hand: Secondary | ICD-10-CM | POA: Diagnosis not present

## 2023-12-25 DIAGNOSIS — H04123 Dry eye syndrome of bilateral lacrimal glands: Secondary | ICD-10-CM | POA: Diagnosis not present

## 2023-12-25 DIAGNOSIS — H43813 Vitreous degeneration, bilateral: Secondary | ICD-10-CM | POA: Diagnosis not present

## 2023-12-25 DIAGNOSIS — H1045 Other chronic allergic conjunctivitis: Secondary | ICD-10-CM | POA: Diagnosis not present

## 2023-12-25 DIAGNOSIS — H5213 Myopia, bilateral: Secondary | ICD-10-CM | POA: Diagnosis not present

## 2023-12-25 DIAGNOSIS — H40013 Open angle with borderline findings, low risk, bilateral: Secondary | ICD-10-CM | POA: Diagnosis not present

## 2023-12-25 DIAGNOSIS — H2513 Age-related nuclear cataract, bilateral: Secondary | ICD-10-CM | POA: Diagnosis not present

## 2024-01-18 ENCOUNTER — Other Ambulatory Visit: Payer: Self-pay | Admitting: Family Medicine

## 2024-01-28 ENCOUNTER — Other Ambulatory Visit: Payer: Self-pay | Admitting: Family Medicine

## 2024-01-28 DIAGNOSIS — E7800 Pure hypercholesterolemia, unspecified: Secondary | ICD-10-CM

## 2024-01-31 DIAGNOSIS — M79642 Pain in left hand: Secondary | ICD-10-CM | POA: Diagnosis not present

## 2024-01-31 DIAGNOSIS — M79641 Pain in right hand: Secondary | ICD-10-CM | POA: Diagnosis not present

## 2024-01-31 DIAGNOSIS — R202 Paresthesia of skin: Secondary | ICD-10-CM | POA: Diagnosis not present

## 2024-02-20 ENCOUNTER — Ambulatory Visit (INDEPENDENT_AMBULATORY_CARE_PROVIDER_SITE_OTHER): Payer: PPO

## 2024-02-20 DIAGNOSIS — Z780 Asymptomatic menopausal state: Secondary | ICD-10-CM

## 2024-02-20 DIAGNOSIS — Z1211 Encounter for screening for malignant neoplasm of colon: Secondary | ICD-10-CM

## 2024-02-20 DIAGNOSIS — Z0000 Encounter for general adult medical examination without abnormal findings: Secondary | ICD-10-CM

## 2024-02-20 NOTE — Progress Notes (Signed)
 Subjective:   Melinda Mack is a 73 y.o. who presents for a Medicare Wellness preventive visit.  Visit Complete: Virtual I connected with  Melinda Mack on 02/20/24 by a audio enabled telemedicine application and verified that I am speaking with the correct person using two identifiers.  Patient Location: Home  Provider Location: Home Office  I discussed the limitations of evaluation and management by telemedicine. The patient expressed understanding and agreed to proceed.  Vital Signs: Because this visit was a virtual/telehealth visit, some criteria may be missing or patient reported. Any vitals not documented were not able to be obtained and vitals that have been documented are patient reported.  VideoDeclined- This patient declined Librarian, academic. Therefore the visit was completed with audio only.  Persons Participating in Visit: Patient.  AWV Questionnaire: No: Patient Medicare AWV questionnaire was not completed prior to this visit.  Cardiac Risk Factors include: advanced age (>61men, >27 women);dyslipidemia     Objective:    Today's Vitals   02/20/24 1506  PainSc: 4    There is no height or weight on file to calculate BMI.     02/20/2024    3:11 PM 01/31/2023    2:23 PM 12/27/2021    2:27 PM 06/03/2020    8:42 AM 06/03/2019    8:46 AM 05/24/2018    9:17 AM 04/24/2017   10:02 AM  Advanced Directives  Does Patient Have a Medical Advance Directive? No Yes No Yes Yes No No  Type of Aeronautical engineer of [place];Living will Living will;Healthcare Power of Attorney    Copy of Healthcare Power of Attorney in Chart?    No - copy requested No - copy requested    Would patient like information on creating a medical advance directive? No - Patient declined  No - Patient declined   Yes (MAU/Ambulatory/Procedural Areas - Information given) Yes (ED - Information included in AVS)    Current Medications  (verified) Outpatient Encounter Medications as of 02/20/2024  Medication Sig   levothyroxine  (SYNTHROID ) 50 MCG tablet Take 1 tablet by mouth once daily   lovastatin  (MEVACOR ) 40 MG tablet TAKE 1 TABLET BY MOUTH AT BEDTIME   Multiple Vitamin (MULTIVITAMIN) capsule Take 1 capsule by mouth daily.   No facility-administered encounter medications on file as of 02/20/2024.    Allergies (verified) Codeine, Penicillins, Tetracycline, Misc. 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   History of chicken pox    History of measles    History of mumps    Past Surgical History:  Procedure Laterality Date   ABDOMINAL HYSTERECTOMY  04/18/2012   cancer at [facility]   BILATERAL SALPINGOOPHORECTOMY  03/2012   BREAST BIOPSY Left 05/08/2016   neg- [facility]   BREAST BIOPSY Right 10/2015   neg- [place]   LYMPHADENECTOMY  03/2012   MANDIBLE FRACTURE SURGERY  1984   TMJ   TONSILLECTOMY AND ADENOIDECTOMY  1970   TUBAL LIGATION  1978   Family History  Problem Relation Age of Onset   Lung disease Mother    COPD Mother    Hypertension Mother    Heart disease Mother    Lung disease Father    Healthy Sister    Healthy Sister    Heart attack Maternal Grandmother    Alzheimer's disease Paternal Grandmother    Coronary artery disease Other    Hypertension  Maternal Uncle    Breast cancer Paternal Aunt 86   Lung cancer Maternal Uncle 78   Social History   Socioeconomic History   Marital status: Widowed    Spouse name: Not on file   Number of children: 3   Years of education: 27   Highest education level: 12th grade  Occupational History    Comment: Film/video editor  Tobacco Use   Smoking status: Former    Current packs/day: 0.00    Average packs/day: 0.5 packs/day for 9.0 years (4.5 ttl pk-yrs)    Types: Cigarettes    Start date: 04/27/1983    Quit date: 04/26/1992    Years since quitting: 31.8    Smokeless tobacco: Never   Tobacco comments:    QUIT IN 1996  Vaping Use   Vaping status: Never Used  Substance and Sexual Activity   Alcohol use: No    Alcohol/week: 0.0 standard drinks of alcohol   Drug use: No   Sexual activity: Not on file  Other Topics Concern   Not on file  Social History Narrative   Widowed   Caffeine use- coffee - 2 cups daily   Social Drivers of Health   Financial Resource Strain: Low Risk  (02/20/2024)   Overall Financial Resource Strain (CARDIA)    Difficulty of Paying Living Expenses: Not hard at all  Food Insecurity: No Food Insecurity (02/20/2024)   Hunger Vital Sign    Worried About Running Out of Food in the Last Year: Never true    Ran Out of Food in the Last Year: Never true  Transportation Needs: No Transportation Needs (02/20/2024)   PRAPARE - Administrator, Civil Service (Medical): No    Lack of Transportation (Non-Medical): No  Physical Activity: Insufficiently Active (02/20/2024)   Exercise Vital Sign    Days of Exercise per Week: 3 days    Minutes of Exercise per Session: 30 min  Stress: No Stress Concern Present (02/20/2024)   [employer] of Occupational Health - Occupational Stress Questionnaire    Feeling of Stress : Not at all  Social Connections: Moderately Integrated (02/20/2024)   Social Connection and Isolation Panel [NHANES]    Frequency of Communication with Friends and Family: More than three times a week    Frequency of Social Gatherings with Friends and Family: Three times a week    Attends Religious Services: More than 4 times per year    Active Member of Clubs or Organizations: Yes    Attends Banker Meetings: More than 4 times per year    Marital Status: Widowed    Tobacco Counseling Counseling given: Not Answered Tobacco comments: QUIT IN 1996    Clinical Intake:  Pre-visit preparation completed: Yes  Pain : 0-10 Pain Score: 4  Pain Type: Chronic pain Pain Location: Hand Pain  Orientation: Right, Left Pain Descriptors / Indicators: Aching, Constant Pain Onset: More than a month ago Pain Frequency: Constant     BMI - recorded: 28.6 Nutritional Status: BMI 25 -29 Overweight Nutritional Risks: None Diabetes: No  Lab Results  Component Value Date   HGBA1C 6.2 (H) 10/22/2023   HGBA1C 6.0 (H) 05/10/2022   HGBA1C 6.0 (H) 09/09/2021     How often do you need to have someone help you when you read instructions, pamphlets, or other written materials from your doctor or pharmacy?: 1 - Never  Interpreter Needed?: No  Information entered by :: Dellie Fergusson, LPN   Activities of Daily Living  02/20/2024    3:11 PM  In your present state of health, do you have any difficulty performing the following activities:  Hearing? 0  Vision? 0  Difficulty concentrating or making decisions? 0  Walking or climbing stairs? 1  Comment OA IN KNEES  Dressing or bathing? 0  Doing errands, shopping? 0  Preparing Food and eating ? N  Using the Toilet? N  In the past six months, have you accidently leaked urine? N  Do you have problems with loss of bowel control? N  Managing your Medications? N  Managing your Finances? N  Housekeeping or managing your Housekeeping? N    Patient Care Team: Lamon Pillow, MD as PCP - General (Family Medicine) Julia Oats, OD as Consulting Physician (Optometry)  Indicate any recent Medical Services you may have received from other than [facility] providers in the past year (date may be approximate).     Assessment:   This is a routine wellness examination for Bonnetta.  Hearing/Vision screen Hearing Screening - Comments:: NO AIDS Vision Screening - Comments:: READERS- DR.WOODARD   Goals Addressed             This Visit's Progress    DIET - INCREASE WATER INTAKE         Depression Screen     02/20/2024    3:09 PM 01/31/2023    2:14 PM 01/18/2023    9:35 AM 06/22/2022    9:04 AM 12/27/2021    2:26 PM 02/21/2021    1:39  PM 06/03/2020    8:40 AM  PHQ 2/9 Scores  PHQ - 2 Score 1 0 0 0 1 0 0  PHQ- 9 Score 1 0 1 1       Fall Risk     02/20/2024    3:11 PM 01/31/2023    2:09 PM 01/18/2023    9:36 AM 01/18/2023    9:35 AM 01/18/2023    9:33 AM  Fall Risk   Falls in the past year? 0 0 0 0 0  Number falls in past yr: 0 0 0    Injury with Fall? 0 0 0 0 0  Risk for fall due to : No Fall Risks No Fall Risks No Fall Risks    Follow up Falls prevention discussed;Falls evaluation completed Education provided;Falls prevention discussed       MEDICARE RISK AT HOME:  Medicare Risk at Home Any stairs in or around the home?: No If so, are there any without handrails?: No Home free of loose throw rugs in walkways, pet beds, electrical cords, etc?: Yes Adequate lighting in your home to reduce risk of falls?: Yes Life alert?: No Use of a cane, walker or w/c?: No Grab bars in the bathroom?: No Shower chair or bench in shower?: Yes Elevated toilet seat or a handicapped toilet?: Yes  TIMED UP AND GO:  Was the test performed?  No  Cognitive Function: 6CIT completed        02/20/2024    3:13 PM 01/31/2023    2:30 PM 04/24/2017   10:08 AM  6CIT Screen  What Year? 0 points 0 points 0 points  What month? 0 points 0 points 0 points  What time? 0 points 0 points 0 points  Count back from 20 0 points 0 points 0 points  Months in reverse 0 points 0 points 0 points  Repeat phrase 0 points 0 points 0 points  Total Score 0 points 0 points 0 points  Immunizations Immunization History  Administered Date(s) Administered   Fluad Quad(high Dose 65+) 12/24/2019   Influenza, High Dose Seasonal PF 07/24/2018   Influenza,inj,Quad PF,6+ Mos 06/16/2015, 08/01/2016   Influenza-Unspecified 08/18/2017   Pneumococcal Conjugate-13 12/06/2016   Pneumococcal Polysaccharide-23 07/24/2018   Tdap 01/21/2009   Zoster, Live 08/28/2011    Screening Tests Health Maintenance  Topic Date Due   COVID-19 Vaccine (1) Never done    Zoster Vaccines- Shingrix  (1 of 2) 04/13/1970   DTaP/Tdap/Td (2 - Td or Tdap) 01/22/2019   MAMMOGRAM  08/18/2021   DEXA SCAN  08/19/2023   Fecal DNA (Cologuard)  04/12/2024   INFLUENZA VACCINE  05/23/2024   Medicare Annual Wellness (AWV)  02/19/2025   Pneumonia Vaccine 83+ Years old  Completed   Hepatitis C Screening  Completed   HPV VACCINES  Aged Out   Meningococcal B Vaccine  Aged Out    Health Maintenance  Health Maintenance Due  Topic Date Due   COVID-19 Vaccine (1) Never done   Zoster Vaccines- Shingrix  (1 of 2) 04/13/1970   DTaP/Tdap/Td (2 - Td or Tdap) 01/22/2019   MAMMOGRAM  08/18/2021   DEXA SCAN  08/19/2023   Health Maintenance Items Addressed: Mammogram ordered IN DECEMBER; BDS ORDERED, COLOGUARD ORDERED; NO COVID SHOTS PER PT; TDAP IS DUE   Additional Screening:  Vision Screening: Recommended annual ophthalmology exams for early detection of glaucoma and other disorders of the eye.  Dental Screening: Recommended annual dental exams for proper oral hygiene  Community Resource Referral / Chronic Care Management: CRR required this visit?  No   CCM required this visit?  No     Plan:     I have personally reviewed and noted the following in the patient's chart:   Medical and social history Use of alcohol, tobacco or illicit drugs  Current medications and supplements including opioid prescriptions. Patient is not currently taking opioid prescriptions. Functional ability and status Nutritional status Physical activity Advanced directives List of other physicians Hospitalizations, surgeries, and ER visits in previous 12 months Vitals Screenings to include cognitive, depression, and falls Referrals and appointments  In addition, I have reviewed and discussed with patient certain preventive protocols, quality metrics, and best practice recommendations. A written personalized care plan for preventive services as well as general preventive health  recommendations were provided to patient.     Pinky Bright, LPN   9/52/8413   After Visit Summary: (MyChart) Due to this being a telephonic visit, the after visit summary with patients personalized plan was offered to patient via MyChart   Notes: BDS ORDERED, COLOGUARD ORDERED

## 2024-02-20 NOTE — Patient Instructions (Addendum)
 [name] , Thank you for taking time to come for your Medicare Wellness Visit. I appreciate your ongoing commitment to your health goals. Please review the following plan we discussed and let me know if I can assist you in the future.   Referrals/Orders/Follow-Ups/Clinician Recommendations: BONE DENSITY ORDERED, COLOGUARD ORDERED  You have an order for:  []   2D Mammogram  []   3D Mammogram  [x]   Bone Density     Please call for appointment:  [facility]  [address] [place] [address] [phone] [facility] [address] [place], [place] [address] [phone] [facility] [address] Tracey [address] [place], [place] [address] [phone]   Make sure to wear two-piece clothing.  No lotions, powders, or deodorants the day of the appointment. Make sure to bring picture ID and insurance card.  Bring list of medications you are currently taking including any supplements.   Schedule your [place] screening mammogram through MyChart!   Log into your MyChart account.  Go to 'Visit' (or 'Appointments' if on mobile App) --> Schedule an Appointment  Under 'Select a Reason for Visit' choose the Mammogram Screening option.  Complete the pre-visit questions and select the time and place that best fits your schedule.   This is a list of the screening recommended for you and due dates:  Health Maintenance  Topic Date Due   COVID-19 Vaccine (1) Never done   Zoster (Shingles) Vaccine (1 of 2) 04/13/1970   DTaP/Tdap/Td vaccine (2 - Td or Tdap) 01/22/2019   Mammogram  08/18/2021   DEXA scan (bone density measurement)  08/19/2023   Cologuard (Stool DNA test)  04/12/2024   Flu Shot  05/23/2024   Medicare Annual Wellness Visit  02/19/2025   Pneumonia Vaccine  Completed   Hepatitis C Screening  Completed   HPV Vaccine  Aged Out   Meningitis B Vaccine  Aged Out     Advanced directives: (ACP Link)Information on Advanced Care Planning can be found at [facility]  Best boy Advance Health Care Directives Advance Health Care Directives. [email or web address]   Next Medicare Annual Wellness Visit scheduled for next year: Yes  02/25/25 @ 3:10 PM BY PHONE

## 2024-03-18 DIAGNOSIS — M1612 Unilateral primary osteoarthritis, left hip: Secondary | ICD-10-CM | POA: Diagnosis not present

## 2024-03-18 DIAGNOSIS — R61 Generalized hyperhidrosis: Secondary | ICD-10-CM | POA: Diagnosis not present

## 2024-03-18 DIAGNOSIS — M5416 Radiculopathy, lumbar region: Secondary | ICD-10-CM | POA: Diagnosis not present

## 2024-03-18 DIAGNOSIS — M160 Bilateral primary osteoarthritis of hip: Secondary | ICD-10-CM | POA: Diagnosis not present

## 2024-03-18 DIAGNOSIS — R52 Pain, unspecified: Secondary | ICD-10-CM | POA: Diagnosis not present

## 2024-03-18 DIAGNOSIS — M1611 Unilateral primary osteoarthritis, right hip: Secondary | ICD-10-CM | POA: Diagnosis not present

## 2024-03-19 DIAGNOSIS — R61 Generalized hyperhidrosis: Secondary | ICD-10-CM | POA: Diagnosis not present

## 2024-03-19 DIAGNOSIS — M1612 Unilateral primary osteoarthritis, left hip: Secondary | ICD-10-CM | POA: Diagnosis not present

## 2024-03-19 DIAGNOSIS — M1611 Unilateral primary osteoarthritis, right hip: Secondary | ICD-10-CM | POA: Diagnosis not present

## 2024-03-19 DIAGNOSIS — M5416 Radiculopathy, lumbar region: Secondary | ICD-10-CM | POA: Diagnosis not present

## 2024-03-19 DIAGNOSIS — R52 Pain, unspecified: Secondary | ICD-10-CM | POA: Diagnosis not present

## 2024-04-04 ENCOUNTER — Ambulatory Visit (INDEPENDENT_AMBULATORY_CARE_PROVIDER_SITE_OTHER): Payer: PPO | Admitting: Family Medicine

## 2024-04-04 VITALS — BP 107/68 | HR 79 | Temp 98.2°F | Wt 162.8 lb

## 2024-04-04 DIAGNOSIS — R5383 Other fatigue: Secondary | ICD-10-CM | POA: Diagnosis not present

## 2024-04-04 DIAGNOSIS — M19041 Primary osteoarthritis, right hand: Secondary | ICD-10-CM | POA: Insufficient documentation

## 2024-04-04 DIAGNOSIS — R748 Abnormal levels of other serum enzymes: Secondary | ICD-10-CM | POA: Diagnosis not present

## 2024-04-04 DIAGNOSIS — M19042 Primary osteoarthritis, left hand: Secondary | ICD-10-CM

## 2024-04-04 DIAGNOSIS — R7303 Prediabetes: Secondary | ICD-10-CM

## 2024-04-04 DIAGNOSIS — E039 Hypothyroidism, unspecified: Secondary | ICD-10-CM

## 2024-04-04 NOTE — Progress Notes (Signed)
      Established patient visit   Patient: Melinda Mack   DOB: 1951-09-06   73 y.o. Female  MRN: [id number] Visit Date: 04/04/2024  Today's healthcare provider: Jeralene Mom, MD   Chief Complaint  Patient presents with   Medical Management of Chronic Issues    Follow-up on prediabetes and kidney function.   Subjective    HPI Follow up hypothyroid, prediabetes and OA. Since last visit has established with Dr. Lydia Sams for OA which is still very bothersome. Has started on low inflammatory diet and avoiding carbohydrates. No other physical complaints. Is under more stress caring for elderly father.   Lab Results  Component Value Date   HGBA1C 6.2 (H) 10/22/2023   HGBA1C 6.0 (H) 05/10/2022   HGBA1C 6.0 (H) 09/09/2021   Wt Readings from Last 3 Encounters:  04/04/24 162 lb 12.8 oz (73.8 kg)  10/22/23 172 lb (78 kg)  01/31/23 171 lb (77.6 kg)   Lab Results  Component Value Date   NA 142 10/22/2023   CL 107 (H) 10/22/2023   K 4.5 10/22/2023   CO2 22 10/22/2023   BUN 21 10/22/2023   CREATININE 1.01 (H) 10/22/2023   EGFR 59 (L) 10/22/2023   CALCIUM 9.7 10/22/2023   PHOS 3.4 12/08/2016   ALBUMIN 4.4 10/22/2023   GLUCOSE 121 (H) 10/22/2023   Last thyroid  functions Lab Results  Component Value Date   TSH 1.850 10/22/2023   T4TOTAL 9.3 12/08/2016     Medications: Outpatient Medications Prior to Visit  Medication Sig   levothyroxine  (SYNTHROID ) 50 MCG tablet Take 1 tablet by mouth once daily   lovastatin  (MEVACOR ) 40 MG tablet TAKE 1 TABLET BY MOUTH AT BEDTIME   Multiple Vitamin (MULTIVITAMIN) capsule Take 1 capsule by mouth daily.   No facility-administered medications prior to visit.    Review of Systems  Constitutional:  Positive for fatigue. Negative for appetite change, chills and fever.  Respiratory:  Negative for chest tightness and shortness of breath.   Cardiovascular:  Negative for chest pain and palpitations.  Gastrointestinal:  Negative for  abdominal pain, nausea and vomiting.  Neurological:  Negative for dizziness and weakness.       Objective    BP 107/68 (BP Location: Left Arm, Patient Position: Sitting, Cuff Size: Normal)   Pulse 79   Temp 98.2 F (36.8 C) (Oral)   Wt 162 lb 12.8 oz (73.8 kg)   SpO2 99%   BMI 27.09 kg/m    Physical Exam   General appearance: Well developed, well nourished female, cooperative and in no acute distress Head: Normocephalic, without obvious abnormality, atraumatic Respiratory: Respirations even and unlabored, normal respiratory rate Extremities: All extremities are intact.  Skin: Skin color, texture, turgor normal. No rashes seen  Psych: Appropriate mood and affect. Neurologic: Mental status: Alert, oriented to person, place, and time, thought content appropriate.    Assessment & Plan     1. Pre-diabetes (Primary)  - Comprehensive metabolic panel with GFR - Hemoglobin A1c  2. Adult hypothyroidism  - T4 AND TSH  3. Other fatigue  - Comprehensive metabolic panel with GFR  4. Primary osteoarthritis of both hands Continue regular follow up Dr. Barbee Lew, MD  [facility] [phone] (phone) [phone] (fax)  [facility]

## 2024-04-05 LAB — COMPREHENSIVE METABOLIC PANEL WITH GFR
ALT: 18 IU/L (ref 0–32)
AST: 18 IU/L (ref 0–40)
Albumin: 4.2 g/dL (ref 3.8–4.8)
Alkaline Phosphatase: 152 IU/L — ABNORMAL HIGH (ref 44–121)
BUN/Creatinine Ratio: 22 (ref 12–28)
BUN: 20 mg/dL (ref 8–27)
Bilirubin Total: 0.4 mg/dL (ref 0.0–1.2)
CO2: 20 mmol/L (ref 20–29)
Calcium: 10 mg/dL (ref 8.7–10.3)
Chloride: 105 mmol/L (ref 96–106)
Creatinine, Ser: 0.92 mg/dL (ref 0.57–1.00)
Globulin, Total: 3.2 g/dL (ref 1.5–4.5)
Glucose: 121 mg/dL — ABNORMAL HIGH (ref 70–99)
Potassium: 4.3 mmol/L (ref 3.5–5.2)
Sodium: 143 mmol/L (ref 134–144)
Total Protein: 7.4 g/dL (ref 6.0–8.5)
eGFR: 66 mL/min/{1.73_m2} (ref >59)

## 2024-04-05 LAB — HEMOGLOBIN A1C
Est. average glucose Bld gHb Est-mCnc: 128 mg/dL
Hgb A1c MFr Bld: 6.1 % — ABNORMAL HIGH (ref 4.8–5.6)

## 2024-04-05 LAB — T4 AND TSH
T4, Total: 9.7 ug/dL (ref 4.5–12.0)
TSH: 1.64 u[IU]/mL (ref 0.450–4.500)

## 2024-04-06 ENCOUNTER — Ambulatory Visit: Payer: Self-pay | Admitting: Family Medicine

## 2024-04-07 NOTE — Progress Notes (Signed)
 Spoke with Bridgette Campus from [facility] test has been added, test number used [id number].

## 2024-04-08 ENCOUNTER — Ambulatory Visit
Admission: RE | Admit: 2024-04-08 | Discharge: 2024-04-08 | Disposition: A | Discharge status: Home / Self Care | Source: Ambulatory Visit | Attending: Family Medicine | Admitting: Family Medicine

## 2024-04-08 ENCOUNTER — Ambulatory Visit: Payer: Self-pay | Admitting: Family Medicine

## 2024-04-08 DIAGNOSIS — Z1231 Encounter for screening mammogram for malignant neoplasm of breast: Secondary | ICD-10-CM | POA: Insufficient documentation

## 2024-04-08 DIAGNOSIS — M8589 Other specified disorders of bone density and structure, multiple sites: Secondary | ICD-10-CM | POA: Diagnosis not present

## 2024-04-08 DIAGNOSIS — Z780 Asymptomatic menopausal state: Secondary | ICD-10-CM

## 2024-04-09 ENCOUNTER — Other Ambulatory Visit: Payer: Self-pay | Admitting: Family Medicine

## 2024-04-09 DIAGNOSIS — R928 Other abnormal and inconclusive findings on diagnostic imaging of breast: Secondary | ICD-10-CM

## 2024-04-11 ENCOUNTER — Encounter: Payer: Self-pay | Admitting: Family Medicine

## 2024-04-11 DIAGNOSIS — R748 Abnormal levels of other serum enzymes: Secondary | ICD-10-CM | POA: Insufficient documentation

## 2024-04-12 ENCOUNTER — Other Ambulatory Visit: Payer: Self-pay | Admitting: Family Medicine

## 2024-04-16 DIAGNOSIS — Z1211 Encounter for screening for malignant neoplasm of colon: Secondary | ICD-10-CM | POA: Diagnosis not present

## 2024-04-19 LAB — COLOGUARD: COLOGUARD: NEGATIVE

## 2024-04-28 ENCOUNTER — Ambulatory Visit
Admission: RE | Admit: 2024-04-28 | Discharge: 2024-04-28 | Disposition: A | Discharge status: Home / Self Care | Source: Ambulatory Visit | Attending: Family Medicine | Admitting: Family Medicine

## 2024-04-28 DIAGNOSIS — R928 Other abnormal and inconclusive findings on diagnostic imaging of breast: Secondary | ICD-10-CM | POA: Diagnosis not present

## 2024-04-29 LAB — ALKALINE PHOSPHATASE ISOENZYMES
Alkaline Phosphatase: 151 IU/L — ABNORMAL HIGH (ref 44–121)
BONE FRACTION %:: 37 %
Bone Fraction IU/L:: 56 IU/L (ref 18–57)
INTESTINAL FRAC.%:: 1 %
INTESTINALFRAC.IU/L:: 2 IU/L (ref 0–14)
LIVER FRACTION %:: 62 %
Liver Fraction IU/L:: 94 IU/L — ABNORMAL HIGH (ref 23–85)

## 2024-04-29 LAB — SPECIMEN STATUS REPORT

## 2024-04-30 ENCOUNTER — Ambulatory Visit
Admission: RE | Admit: 2024-04-30 | Discharge: 2024-04-30 | Disposition: A | Discharge status: Home / Self Care | Source: Ambulatory Visit | Attending: Family Medicine | Admitting: Family Medicine

## 2024-04-30 DIAGNOSIS — R928 Other abnormal and inconclusive findings on diagnostic imaging of breast: Secondary | ICD-10-CM | POA: Insufficient documentation

## 2024-04-30 DIAGNOSIS — N6322 Unspecified lump in the left breast, upper inner quadrant: Secondary | ICD-10-CM | POA: Diagnosis not present

## 2024-04-30 HISTORY — PX: BREAST BIOPSY: SHX20

## 2024-04-30 MED ORDER — LIDOCAINE-EPINEPHRINE 1 %-1:100000 IJ SOLN
8.0000 mL | Freq: Once | INTRAMUSCULAR | Status: AC
  Administered 2024-04-30: 8 mL
  Filled 2024-04-30: qty 8

## 2024-04-30 MED ORDER — LIDOCAINE 1 % OPTIME INJ - NO CHARGE
2.0000 mL | Freq: Once | INTRAMUSCULAR | Status: AC
  Administered 2024-04-30: 2 mL
  Filled 2024-04-30: qty 2

## 2024-05-02 LAB — SURGICAL PATHOLOGY

## 2024-05-05 ENCOUNTER — Encounter: Payer: Self-pay | Admitting: *Deleted

## 2024-05-05 NOTE — Progress Notes (Unsigned)
 Received referral for newly diagnosed breast cancer from [facility].  Navigation initiated.  Discussed surgeons and medical oncologists.   Melinda Mack is going to discuss with her PCP and call me back to let me know who she would like to see.

## 2024-05-05 NOTE — Telephone Encounter (Unsigned)
 Copied from CRM [id number]. Topic: Clinical - Medical Advice >> May 05, 2024  1:54 PM Precious C wrote: Reason for CRM: Patient called in requesting feedback from her provider regarding which surgeon would be best to see. She was recently diagnosed with breast cancer and mentioned that her oncologist nurse gave her a list of providers, but she would like her PCP's input before making a decision. Patient is requesting a callback from her doctor. She can be contacted anytime, any day at [phone].

## 2024-05-07 DIAGNOSIS — M79642 Pain in left hand: Secondary | ICD-10-CM | POA: Diagnosis not present

## 2024-05-07 DIAGNOSIS — R202 Paresthesia of skin: Secondary | ICD-10-CM | POA: Diagnosis not present

## 2024-05-07 DIAGNOSIS — C50919 Malignant neoplasm of unspecified site of unspecified female breast: Secondary | ICD-10-CM | POA: Diagnosis not present

## 2024-05-07 DIAGNOSIS — M542 Cervicalgia: Secondary | ICD-10-CM | POA: Diagnosis not present

## 2024-05-07 DIAGNOSIS — M79641 Pain in right hand: Secondary | ICD-10-CM | POA: Diagnosis not present

## 2024-05-08 ENCOUNTER — Encounter: Payer: Self-pay | Admitting: *Deleted

## 2024-05-08 DIAGNOSIS — C50919 Malignant neoplasm of unspecified site of unspecified female breast: Secondary | ICD-10-CM

## 2024-05-08 NOTE — Progress Notes (Signed)
 Ms. Gernert would like to see Dr. Babara and Dr. Cesar.   She will see Dr. Babara on 7/23 at 3 and Dr. Cesar on 7/21.

## 2024-05-12 ENCOUNTER — Other Ambulatory Visit: Payer: Self-pay | Admitting: General Surgery

## 2024-05-12 DIAGNOSIS — C50212 Malignant neoplasm of upper-inner quadrant of left female breast: Secondary | ICD-10-CM

## 2024-05-12 NOTE — H&P (View-Only) (Signed)
 History of Present Illness Melinda Mack is a 73 year old female with a history of uterine cancer who presents for surgical consultation following a confirmed diagnosis of left breast cancer. She was referred by Dr. Gasper for evaluation of her breast cancer.  She was diagnosed with left breast cancer following a routine screening mammogram on April 09, 2024, which revealed a suspicious area. A diagnostic mammogram on April 28, 2024, identified an 8 mm mass at the eleven o'clock position in the left breast. 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 ER/PR and HER2 receptor statuses are pending.  No palpable masses, skin changes, or nipple discharge prior to the mammogram. She has a history of previous benign breast biopsies in 2017. She describes tenderness in the left breast following the biopsy, likening it to 'when my milk would come in,' and notes some swelling in the area.  Her family history is significant for breast cancer in her paternal aunt. She began menstruating at age 53 and experienced menopause at age 52. She has had three pregnancies, with her first pregnancy at age 17. She denies any history of hormone replacement therapy or radiation therapy.  She has a history of uterine cancer treated successfully without radiation or chemotherapy thirteen years ago. She is concerned about the potential for cancer recurrence and the implications of surgical options.      PAST MEDICAL HISTORY:  Past Medical History:  Diagnosis Date  . Breast cancer (CMS/HHS-HCC) 04/2024   Lt breast papilary carcinoma  . Uterine cancer (CMS/HHS-HCC) 2013        PAST SURGICAL HISTORY:   Past Surgical History:  Procedure Laterality Date  . LAPAROSCOPIC HYSTERECTOMY TOTAL ROBOTIC  04/08/2012         MEDICATIONS:  Outpatient Encounter Medications as of 05/12/2024  Medication Sig  Dispense Refill  . DULoxetine (CYMBALTA) 30 MG DR capsule Take 1 capsule (30 mg total) by mouth once daily for 45 days, THEN 2 capsules (60 mg total) once daily for 45 days. 135 capsule 0  . levothyroxine  (SYNTHROID ) 50 MCG tablet Take 1 tablet by mouth once daily    . lovastatin  (MEVACOR ) 40 MG tablet Take 1 tablet by mouth at bedtime    . multivitamin with minerals Cap Take 1 capsule by mouth once daily     No facility-administered encounter medications on file as of 05/12/2024.     ALLERGIES:   Codeine, Penicillins, Tetracycline, Betadine [povidone-iodine], and Sulfa (sulfonamide antibiotics)   SOCIAL HISTORY:  Social History   Socioeconomic History  . Marital status: Single  Tobacco Use  . Smoking status: Former    Types: Cigarettes  . Smokeless tobacco: Never  Vaping Use  . Vaping status: Never Used   Social Drivers of Corporate investment banker Strain: Low Risk  (05/12/2024)   Overall Financial Resource Strain (CARDIA)   . Difficulty of Paying Living Expenses: Not hard at all  Food Insecurity: No Food Insecurity (05/12/2024)   Hunger Vital Sign   . Worried About Programme researcher, broadcasting/film/video in the Last Year: Never true   . Ran Out of Food in the Last Year: Never true  Transportation Needs: No Transportation Needs (05/12/2024)   PRAPARE - Transportation   . Lack of Transportation (Medical): No   . Lack of Transportation (Non-Medical): No    FAMILY HISTORY:  Breast Cancer - Aunt paternal side  GENERAL REVIEW OF SYSTEMS:  General ROS: negative for - chills, fatigue, fever, weight gain or weight loss Allergy and Immunology ROS: negative for - hives  Hematological and Lymphatic ROS: negative for - bleeding problems or bruising, negative for palpable nodes Endocrine ROS: negative for - heat or cold intolerance, hair changes Respiratory ROS: negative for - cough, shortness of breath or wheezing Cardiovascular ROS: no chest pain or palpitations GI ROS: negative for nausea,  vomiting, abdominal pain, diarrhea, constipation Musculoskeletal ROS: negative for - joint swelling or muscle pain Neurological ROS: negative for - confusion, syncope Dermatological ROS: negative for pruritus and rash  PHYSICAL EXAM:  Vitals:   05/12/24 0838  BP: 136/88  Pulse: 76  .  Ht:163.8 cm (5' 4.5) Wt:73.5 kg (162 lb) ADJ:Anib surface area is 1.83 meters squared. Body mass index is 27.38 kg/m.Melinda Mack   GENERAL: Alert, active, oriented x3  HEENT: Pupils equal reactive to light. Extraocular movements are intact. Sclera clear. Palpebral conjunctiva normal red color.Pharynx clear.  NECK: Supple with no palpable mass and no adenopathy.  LUNGS: Sound clear with no rales rhonchi or wheezes.  HEART: Regular rhythm S1 and S2 without murmur.  BREAST: Both breast examined in the supine position.  Tenderness palpation on the left breast biopsy area.  Otherwise no palpable masses, skin changes, nipple retraction or nipple discharge.  Right breast without any palpable masses, skin changes.  No axillary adenopathy.  ABDOMEN: Soft and depressible, nontender with no palpable mass, no hepatomegaly.   EXTREMITIES: Well-developed well-nourished symmetrical with no dependent edema.  NEUROLOGICAL: Awake alert oriented, facial expression symmetrical, moving all extremities.   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 ER/PR and HER2 status are pending. The cancer is non-aggressive, but there is a small focus suspicious for invasion. She has a family history of breast cancer (paternal  aunt) and experienced early menarche at age 89. Prefers less invasive surgery and is concerned about radiation and chemotherapy. Proceed with partial mastectomy to excise the mass with clear margins. Perform sentinel lymph node biopsy during surgery to assess for potential invasion. Discuss potential need for re-excision if margins are not clear on final pathology. Discuss potential need for radiation therapy and antiestrogen therapy. Coordinate with Dr. Babara for further evaluation and confirmation of surgical plan.    Malignant neoplasm of upper-inner quadrant of left female breast, unspecified estrogen receptor status (CMS/HHS-HCC) [C50.212]          Patient and three daughters verbalized understanding, all questions were answered, and were agreeable with the plan outlined above.   Lucas Sjogren, MD  Electronically signed by Lucas Sjogren, MD

## 2024-05-13 ENCOUNTER — Other Ambulatory Visit: Payer: Self-pay | Admitting: General Surgery

## 2024-05-13 DIAGNOSIS — C50212 Malignant neoplasm of upper-inner quadrant of left female breast: Secondary | ICD-10-CM

## 2024-05-14 ENCOUNTER — Inpatient Hospital Stay

## 2024-05-14 ENCOUNTER — Telehealth: Payer: Self-pay

## 2024-05-14 ENCOUNTER — Inpatient Hospital Stay: Admitting: Oncology

## 2024-05-14 NOTE — Telephone Encounter (Signed)
 Spoke with Larry at [facility], it seems that these testings weren't ordered.  They are calling Dr. Frutoso to get testing added.

## 2024-05-14 NOTE — Telephone Encounter (Signed)
 Called [facility] to request update on pending ER/PR/HER2 results from 04/30/24.

## 2024-05-14 NOTE — Telephone Encounter (Signed)
 called and informed pt that since ER / PR / HER 2 results are not back, it is most likely that surgery will be r/s and she will be contacted by Dr. Boone office with new date. Pt would like to cancel today's appt since Dr. Babara would not be able to give a recommendation. Informed her that we will keep an eye on result and we will call to r/s her once results are back.

## 2024-05-15 ENCOUNTER — Ambulatory Visit: Payer: Self-pay | Admitting: General Surgery

## 2024-05-16 ENCOUNTER — Inpatient Hospital Stay

## 2024-05-16 ENCOUNTER — Inpatient Hospital Stay: Attending: Oncology | Admitting: Oncology

## 2024-05-16 ENCOUNTER — Other Ambulatory Visit: Payer: Self-pay

## 2024-05-16 ENCOUNTER — Encounter: Payer: Self-pay | Admitting: Oncology

## 2024-05-16 ENCOUNTER — Encounter
Admission: RE | Admit: 2024-05-16 | Discharge: 2024-05-16 | Disposition: A | Discharge status: Home / Self Care | Source: Ambulatory Visit | Attending: General Surgery | Admitting: General Surgery

## 2024-05-16 VITALS — BP 147/73 | HR 88 | Temp 98.0°F | Resp 18 | Wt 159.6 lb

## 2024-05-16 DIAGNOSIS — Z90722 Acquired absence of ovaries, bilateral: Secondary | ICD-10-CM | POA: Insufficient documentation

## 2024-05-16 DIAGNOSIS — Z1721 Progesterone receptor positive status: Secondary | ICD-10-CM | POA: Diagnosis not present

## 2024-05-16 DIAGNOSIS — Z90710 Acquired absence of both cervix and uterus: Secondary | ICD-10-CM | POA: Diagnosis not present

## 2024-05-16 DIAGNOSIS — C50212 Malignant neoplasm of upper-inner quadrant of left female breast: Secondary | ICD-10-CM | POA: Diagnosis not present

## 2024-05-16 DIAGNOSIS — E7800 Pure hypercholesterolemia, unspecified: Secondary | ICD-10-CM | POA: Insufficient documentation

## 2024-05-16 DIAGNOSIS — Z8249 Family history of ischemic heart disease and other diseases of the circulatory system: Secondary | ICD-10-CM | POA: Diagnosis not present

## 2024-05-16 DIAGNOSIS — Z170 Estrogen receptor positive status [ER+]: Secondary | ICD-10-CM | POA: Diagnosis not present

## 2024-05-16 DIAGNOSIS — Z8614 Personal history of Methicillin resistant Staphylococcus aureus infection: Secondary | ICD-10-CM | POA: Diagnosis not present

## 2024-05-16 DIAGNOSIS — Z825 Family history of asthma and other chronic lower respiratory diseases: Secondary | ICD-10-CM | POA: Insufficient documentation

## 2024-05-16 DIAGNOSIS — Z880 Allergy status to penicillin: Secondary | ICD-10-CM | POA: Diagnosis not present

## 2024-05-16 DIAGNOSIS — Z8542 Personal history of malignant neoplasm of other parts of uterus: Secondary | ICD-10-CM | POA: Insufficient documentation

## 2024-05-16 DIAGNOSIS — R748 Abnormal levels of other serum enzymes: Secondary | ICD-10-CM | POA: Diagnosis not present

## 2024-05-16 DIAGNOSIS — Z87891 Personal history of nicotine dependence: Secondary | ICD-10-CM | POA: Diagnosis not present

## 2024-05-16 DIAGNOSIS — N6489 Other specified disorders of breast: Secondary | ICD-10-CM | POA: Diagnosis not present

## 2024-05-16 DIAGNOSIS — Z888 Allergy status to other drugs, medicaments and biological substances status: Secondary | ICD-10-CM | POA: Insufficient documentation

## 2024-05-16 DIAGNOSIS — M1990 Unspecified osteoarthritis, unspecified site: Secondary | ICD-10-CM | POA: Diagnosis not present

## 2024-05-16 DIAGNOSIS — Z79899 Other long term (current) drug therapy: Secondary | ICD-10-CM | POA: Diagnosis not present

## 2024-05-16 DIAGNOSIS — Z882 Allergy status to sulfonamides status: Secondary | ICD-10-CM | POA: Insufficient documentation

## 2024-05-16 DIAGNOSIS — R7303 Prediabetes: Secondary | ICD-10-CM

## 2024-05-16 DIAGNOSIS — N6322 Unspecified lump in the left breast, upper inner quadrant: Secondary | ICD-10-CM | POA: Diagnosis not present

## 2024-05-16 DIAGNOSIS — E039 Hypothyroidism, unspecified: Secondary | ICD-10-CM | POA: Diagnosis not present

## 2024-05-16 DIAGNOSIS — Z803 Family history of malignant neoplasm of breast: Secondary | ICD-10-CM | POA: Insufficient documentation

## 2024-05-16 DIAGNOSIS — C50919 Malignant neoplasm of unspecified site of unspecified female breast: Secondary | ICD-10-CM | POA: Insufficient documentation

## 2024-05-16 DIAGNOSIS — Z1732 Human epidermal growth factor receptor 2 negative status: Secondary | ICD-10-CM | POA: Insufficient documentation

## 2024-05-16 DIAGNOSIS — Z01810 Encounter for preprocedural cardiovascular examination: Secondary | ICD-10-CM

## 2024-05-16 DIAGNOSIS — Z881 Allergy status to other antibiotic agents status: Secondary | ICD-10-CM | POA: Diagnosis not present

## 2024-05-16 DIAGNOSIS — Z79890 Hormone replacement therapy (postmenopausal): Secondary | ICD-10-CM | POA: Insufficient documentation

## 2024-05-16 DIAGNOSIS — Z801 Family history of malignant neoplasm of trachea, bronchus and lung: Secondary | ICD-10-CM | POA: Insufficient documentation

## 2024-05-16 DIAGNOSIS — Z885 Allergy status to narcotic agent status: Secondary | ICD-10-CM | POA: Insufficient documentation

## 2024-05-16 DIAGNOSIS — Z139 Encounter for screening, unspecified: Secondary | ICD-10-CM

## 2024-05-16 DIAGNOSIS — Z818 Family history of other mental and behavioral disorders: Secondary | ICD-10-CM | POA: Insufficient documentation

## 2024-05-16 HISTORY — DX: Cystitis, unspecified without hematuria: N30.90

## 2024-05-16 HISTORY — DX: Anemia, unspecified: D64.9

## 2024-05-16 HISTORY — DX: Abnormal levels of other serum enzymes: R74.8

## 2024-05-16 HISTORY — DX: Prediabetes: R73.03

## 2024-05-16 HISTORY — DX: Anxiety disorder, unspecified: F41.9

## 2024-05-16 HISTORY — DX: Elevated C-reactive protein (CRP): R79.82

## 2024-05-16 HISTORY — DX: Unspecified osteoarthritis, unspecified site: M19.90

## 2024-05-16 HISTORY — DX: Depression, unspecified: F32.A

## 2024-05-16 HISTORY — DX: Personal history of malignant neoplasm of other parts of uterus: Z85.42

## 2024-05-16 HISTORY — DX: Obesity, unspecified: E66.9

## 2024-05-16 HISTORY — DX: Gastro-esophageal reflux disease without esophagitis: K21.9

## 2024-05-16 HISTORY — DX: Personal history of nicotine dependence: Z87.891

## 2024-05-16 HISTORY — DX: Malignant neoplasm of unspecified site of left female breast: C50.912

## 2024-05-16 HISTORY — DX: Personal history of Methicillin resistant Staphylococcus aureus infection: Z86.14

## 2024-05-16 LAB — HEPATIC FUNCTION PANEL
ALT: 23 U/L (ref 0–44)
AST: 24 U/L (ref 15–41)
Albumin: 4.4 g/dL (ref 3.5–5.0)
Alkaline Phosphatase: 102 U/L (ref 38–126)
Bilirubin, Direct: 0.1 mg/dL (ref 0.0–0.2)
Indirect Bilirubin: 0.5 mg/dL (ref 0.3–0.9)
Total Bilirubin: 0.6 mg/dL (ref 0.0–1.2)
Total Protein: 7.9 g/dL (ref 6.5–8.1)

## 2024-05-16 LAB — GAMMA GT: GGT: 80 U/L — ABNORMAL HIGH (ref 7–50)

## 2024-05-16 NOTE — Assessment & Plan Note (Signed)
 Pathology and radiology counseling: Discussed with the patient, the details of pathology including the type of breast cancer,the clinical staging.  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  Treatment plan: 1.  Breast conserving surgery with sentinel lymph node biopsy 2.  Adjuvant radiation 3  Adjuvant antiestrogen therapy

## 2024-05-16 NOTE — Telephone Encounter (Signed)
 Testing has been completed. Pt scheduled to follow up with Dr. Babara on 05/16/24.

## 2024-05-16 NOTE — Progress Notes (Signed)
 Pt here to establish care for Breast cancer

## 2024-05-16 NOTE — Patient Instructions (Addendum)
 Your procedure is scheduled on:05-26-24 Monday Report to the Registration Desk on the 1st floor of the [facility].Then proceed to the 2nd desk on the right (Radiology). Arrive at 7:30 am  REMEMBER: Instructions that are not followed completely may result in serious medical risk, up to and including death; or upon the discretion of your surgeon and anesthesiologist your surgery may need to be rescheduled.  Do not eat food OR drink liquids after midnight the night before surgery.  No gum chewing or hard candies.  One week prior to surgery:Last dose will be on 05-18-24  Stop Anti-inflammatories (NSAIDS) such as Advil, Aleve, Ibuprofen, Motrin, Naproxen, Naprosyn and Aspirin based products such as Excedrin, Goody's Powder, BC Powder. Stop ANY OVER THE COUNTER supplements until after surgery (Multivitamin)  You may however, continue to take Tylenol if needed for pain up until the day of surgery  Continue taking all of your other prescription medications up until the day of surgery.  ON THE DAY OF SURGERY ONLY TAKE THESE MEDICATIONS WITH SIPS OF WATER: -DULoxetine (CYMBALTA)  -levothyroxine  (SYNTHROID )   No Alcohol for 24 hours before or after surgery.  No Smoking including e-cigarettes for 24 hours before surgery.  No chewable tobacco products for at least 6 hours before surgery.  No nicotine patches on the day of surgery.  Do not use any recreational drugs for at least a week (preferably 2 weeks) before your surgery.  Please be advised that the combination of cocaine and anesthesia may have negative outcomes, up to and including death. If you test positive for cocaine, your surgery will be cancelled.  On the morning of surgery brush your teeth with toothpaste and water, you may rinse your mouth with mouthwash if you wish. Do not swallow any toothpaste or mouthwash.  Use CHG Soap as directed on instruction sheet.  Do not wear jewelry, make-up, hairpins, clips or nail polish.  For  welded (permanent) jewelry: bracelets, anklets, waist bands, etc.  Please have this removed prior to surgery.  If it is not removed, there is a chance that hospital personnel will need to cut it off on the day of surgery.  Do not wear lotions, powders, or perfumes.   Do not shave body hair from the neck down 48 hours before surgery.  Contact lenses, hearing aids and dentures may not be worn into surgery.  Do not bring valuables to the hospital. [facility] is not responsible for any missing/lost belongings or valuables.   Notify your doctor if there is any change in your medical condition (cold, fever, infection).  Wear comfortable clothing (specific to your surgery type) to the hospital.  After surgery, you can help prevent lung complications by doing breathing exercises.  Take deep breaths and cough every 1-2 hours. Your doctor may order a device called an Incentive Spirometer to help you take deep breaths. When coughing or sneezing, hold a pillow firmly against your incision with both hands. This is called "splinting." Doing this helps protect your incision. It also decreases belly discomfort.  If you are being admitted to the hospital overnight, leave your suitcase in the car. After surgery it may be brought to your room.  In case of increased patient census, it may be necessary for you, the patient, to continue your postoperative care in the Same Day Surgery department.  If you are being discharged the day of surgery, you will not be allowed to drive home. You will need a responsible individual to drive you home and stay  with you for 24 hours after surgery.   If you are taking public transportation, you will need to have a responsible individual with you.  Please call the Pre-admissions Testing Dept. at [phone] if you have any questions about these instructions.  Surgery Visitation Policy:  Patients having surgery or a procedure may have two visitors.  Children under the  age of 35 must have an adult with them who is not the patient.     Preparing for Surgery with CHLORHEXIDINE GLUCONATE (CHG) Soap  Chlorhexidine Gluconate (CHG) Soap  o An antiseptic cleaner that kills germs and bonds with the skin to continue killing germs even after washing  o Used for showering the night before surgery and morning of surgery  Before surgery, you can play an important role by reducing the number of germs on your skin.  CHG (Chlorhexidine gluconate) soap is an antiseptic cleanser which kills germs and bonds with the skin to continue killing germs even after washing.  Please do not use if you have an allergy to CHG or antibacterial soaps. If your skin becomes reddened/irritated stop using the CHG.  1. Shower the NIGHT BEFORE SURGERY and the MORNING OF SURGERY with CHG soap.  2. If you choose to wash your hair, wash your hair first as usual with your normal shampoo.  3. After shampooing, rinse your hair and body thoroughly to remove the shampoo.  4. Use CHG as you would any other liquid soap. You can apply CHG directly to the skin and wash gently with a scrungie or a clean washcloth.  5. Apply the CHG soap to your body only from the neck down. Do not use on open wounds or open sores. Avoid contact with your eyes, ears, mouth, and genitals (private parts). Wash face and genitals (private parts) with your normal soap.  6. Wash thoroughly, paying special attention to the area where your surgery will be performed.  7. Thoroughly rinse your body with warm water.  8. Do not shower/wash with your normal soap after using and rinsing off the CHG soap.  9. Pat yourself dry with a clean towel.  10. Wear clean pajamas to bed the night before surgery.  12. Place clean sheets on your bed the night of your first shower and do not sleep with pets.  13. Shower again with the CHG soap on the day of surgery prior to arriving at the hospital.  14. Do not apply any  deodorants/lotions/powders.  15. Please wear clean clothes to the hospital.   [employer] to address health-related social needs:  [email or web address]

## 2024-05-16 NOTE — Progress Notes (Signed)
 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  Treatment plan: 1.  Breast conserving surgery with sentinel lymph node biopsy 2.  Adjuvant radiation 3  Adjuvant antiestrogen therapy   Elevated alkaline phosphatase measurement Patient has a history of elevated alkaline phosphatase. Check LFT, GGT and isoenzymes.   Orders Placed This Encounter  Procedures   Hepatic function panel    Standing Status:   Future    Number of Occurrences:   1    Expected Date:   05/16/2024    Expiration Date:   08/14/2024   Alkaline Phosphatase, Isoenzymes    Standing Status:   Future    Number of Occurrences:   1    Expected Date:   05/16/2024    Expiration Date:   08/14/2024   Gamma GT    Standing Status:   Future    Number of Occurrences:   1    Expected Date:   05/16/2024    Expiration Date:   08/14/2024   Ambulatory referral to Social Work    Referral Priority:   Routine    Referral Type:   Consultation    Referral Reason:   Specialty Services Required    Number of Visits Requested:   1   Follow-up to be determined. All questions were answered. The patient knows to call the clinic with any problems, questions or concerns.  Zelphia Cap, MD, PhD [facility] Hematology Oncology 05/16/2024    HISTORY OF PRESENTING ILLNESS:  Melinda Mack 73 y.o. female presents to establish care for lest breast solitary papillary carcinoma I have reviewed her chart and materials related to her cancer extensively and collaborated history with the  patient. 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  No findings suspicious for malignancy in the right breast.   04/28/2024 Mammogram   Unilateral left breast diagnostic mammogram and ultrasound showed   Suspicious LEFT breast mass measuring 8 mm at the 11 o'clock position requires further characterization with ultrasound-guided biopsy.    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 Pathology showed 1. Breast, left, needle core biopsy, 11 o'clock, 12cmfn, coil clip :       - SOLID PAPILLARY CARCINOMA, SEE NOTE       - SMALL FOCUS SUSPICIOUS FOR INVASION       - LYMPHOVASCULAR INVASION: NOT IDENTIFIED       - CANCER LENGTH: 0.7 CM       - CALCIFICATIONS: NOT IDENTIFIED       - OTHER FINDINGS: NONE   ER 95% positive, PR 100% positive, Ki-67 2%, HER2 - (1+)       Patient presents to establish care.  Accompanied by daughter.  MEDICAL HISTORY:  Past Medical History:  Diagnosis Date   Anemia    Anxiety    Breast cancer, left (HCC)    CRP elevated    Depression  Elevated alkaline phosphatase level    GERD (gastroesophageal reflux disease)    History of chicken pox    History of endometrial cancer    History of measles    History of methicillin resistant staphylococcus aureus (MRSA)    History of mumps    History of tobacco use    Hypercholesterolemia    Hypothyroidism    Obesity    Osteoarthritis    Pre-diabetes    Recurrent cystitis     SURGICAL HISTORY: Past Surgical History:  Procedure Laterality Date   ABDOMINAL HYSTERECTOMY  04/18/2012   cancer at [facility]   BILATERAL SALPINGOOPHORECTOMY  03/2012   BREAST BIOPSY Left 05/08/2016   neg- [facility]   BREAST BIOPSY Right 10/2015    neg- [facility]   BREAST BIOPSY Left 04/30/2024   US  LT BREAST BX W LOC DEV 1ST LESION IMG BX SPEC US  GUIDE 04/30/2024 [facility]   LYMPHADENECTOMY  03/2012   MANDIBLE FRACTURE SURGERY  1984   TMJ   TONSILLECTOMY AND ADENOIDECTOMY  1970   TUBAL LIGATION  1978   US  LT BREAST BX W LOC DEV 1ST LESION IMG BX SPEC US  GUIDE Left    US  BX LEFT BREAST COIL CLIP-PATH PENDING    SOCIAL HISTORY: Social History   Socioeconomic History   Marital status: Widowed    Spouse name: Not on file   Number of children: 3   Years of education: 80   Highest education level: 12th grade  Occupational History    Comment: Film/video editor  Tobacco Use   Smoking status: Former    Current packs/day: 0.00    Average packs/day: 0.5 packs/day for 9.0 years (4.5 ttl pk-yrs)    Types: Cigarettes    Start date: 04/27/1983    Quit date: 04/26/1994    Years since quitting: 30.0   Smokeless tobacco: Never   Tobacco comments:    QUIT IN 1996  Vaping Use   Vaping status: Never Used  Substance and Sexual Activity   Alcohol use: No    Alcohol/week: 0.0 standard drinks of alcohol   Drug use: No   Sexual activity: Not on file  Other Topics Concern   Not on file  Social History Narrative   Widowed   Caffeine use- coffee - 2 cups daily   Social Drivers of Health   Financial Resource Strain: Low Risk  (05/12/2024)   Received from [facility]   Overall Financial Resource Strain (CARDIA)    Difficulty of Paying Living Expenses: Not hard at all  Food Insecurity: No Food Insecurity (05/16/2024)   Hunger Vital Sign    Worried About Running Out of Food in the Last Year: Never true    Ran Out of Food in the Last Year: Never true  Transportation Needs: No Transportation Needs (05/16/2024)   PRAPARE - Administrator, Civil Service (Medical): No    Lack of Transportation (Non-Medical): No  Physical Activity: Insufficiently Active (04/04/2024)   Exercise Vital Sign    Days of Exercise  per Week: 2 days    Minutes of Exercise per Session: 20 min  Stress: No Stress Concern Present (04/04/2024)   [employer] of Occupational Health - Occupational Stress Questionnaire    Feeling of Stress: Not at all  Social Connections: Moderately Integrated (04/04/2024)   Social Connection and Isolation Panel    Frequency of Communication with Friends and Family: More than three times a week    Frequency of Social Gatherings with Friends and  Family: More than three times a week    Attends Religious Services: More than 4 times per year    Active Member of Clubs or Organizations: Yes    Attends Banker Meetings: 1 to 4 times per year    Marital Status: Widowed  Intimate Partner Violence: Patient Declined (05/16/2024)   Humiliation, Afraid, Rape, and Kick questionnaire    Fear of Current or Ex-Partner: Patient declined    Emotionally Abused: Patient declined    Physically Abused: Patient declined    Sexually Abused: Patient declined    FAMILY HISTORY: Family History  Problem Relation Age of Onset   Lung disease Mother    COPD Mother    Hypertension Mother    Heart disease Mother    Aneurysm Mother    Lung disease Father    Emphysema Father    Healthy Sister    Healthy Sister    Hypertension Maternal Uncle    Lung cancer Maternal Uncle 71   Breast cancer Paternal Aunt 48   Aneurysm Maternal Grandmother    Heart attack Maternal Grandfather    Alzheimer's disease Paternal Grandmother    Coronary artery disease Other     ALLERGIES:  is allergic to codeine, penicillins, tetracycline, povidone-iodine, and misc. sulfonamide containing compounds.  MEDICATIONS:  Current Outpatient Medications  Medication Sig Dispense Refill   DULoxetine (CYMBALTA) 30 MG capsule Take 30 mg by mouth every morning.     levothyroxine  (SYNTHROID ) 50 MCG tablet Take 1 tablet by mouth once daily (Patient taking differently: Take 50 mcg by mouth daily before breakfast.) 90 tablet 0    lovastatin  (MEVACOR ) 40 MG tablet TAKE 1 TABLET BY MOUTH AT BEDTIME (Patient taking differently: Take 40 mg by mouth every evening.) 90 tablet 3   Multiple Vitamin (MULTIVITAMIN WITH MINERALS) TABS tablet Take 1 tablet by mouth daily.     No current facility-administered medications for this visit.    Review of Systems  Constitutional:  Negative for appetite change, chills, fatigue and fever.  HENT:   Negative for hearing loss and voice change.   Eyes:  Negative for eye problems.  Respiratory:  Negative for chest tightness and cough.   Cardiovascular:  Negative for chest pain.  Gastrointestinal:  Negative for abdominal distention, abdominal pain and blood in stool.  Endocrine: Negative for hot flashes.  Genitourinary:  Negative for difficulty urinating and frequency.   Musculoskeletal:  Negative for arthralgias.  Skin:  Negative for itching and rash.  Neurological:  Negative for extremity weakness.  Hematological:  Negative for adenopathy.  Psychiatric/Behavioral:  Negative for confusion.      PHYSICAL EXAMINATION: ECOG PERFORMANCE STATUS: 0 - Asymptomatic  Vitals:   05/16/24 1159 05/16/24 1215  BP: (!) 151/82 (!) 147/73  Pulse: 88   Resp: 18   Temp: 98 F (36.7 C)    Filed Weights   05/16/24 1159  Weight: 159 lb 9.6 oz (72.4 kg)    Physical Exam Constitutional:      General: She is not in acute distress.    Appearance: She is not diaphoretic.  HENT:     Head: Normocephalic and atraumatic.  Eyes:     General: No scleral icterus. Cardiovascular:     Rate and Rhythm: Normal rate and regular rhythm.     Heart sounds: No murmur heard. Pulmonary:     Effort: Pulmonary effort is normal. No respiratory distress.     Breath sounds: Normal breath sounds.  Abdominal:     General: Bowel  sounds are normal. There is no distension.     Palpations: Abdomen is soft.     Tenderness: There is no abdominal tenderness.  Musculoskeletal:        General: Normal range of motion.      Cervical back: Normal range of motion and neck supple.  Skin:    General: Skin is warm and dry.     Findings: No erythema.  Neurological:     Mental Status: She is alert and oriented to person, place, and time. Mental status is at baseline.     Motor: No abnormal muscle tone.  Psychiatric:        Mood and Affect: Affect normal.    Breast exam was performed in seated and lying down position. Patient is status post left breast mass biopsy, tissue swelling at the site of biopsy.  No palpable breast masses bilaterally.  No palpable axillary adenopathy bilaterally.   LABORATORY DATA:  I have reviewed the data as listed    Latest Ref Rng & Units 10/22/2023    9:32 AM 05/10/2022   10:05 AM 02/23/2021    8:03 AM  CBC  WBC 3.4 - 10.8 x10E3/uL 6.2  6.6  7.1   Hemoglobin 11.1 - 15.9 g/dL 86.3  85.9  86.1   Hematocrit 34.0 - 46.6 % 41.4  43.0  41.9   Platelets 150 - 450 x10E3/uL 273  277  271       Latest Ref Rng & Units 05/16/2024   12:51 PM 04/04/2024    9:13 AM 10/22/2023    9:32 AM  CMP  Glucose 70 - 99 mg/dL  878  878   BUN 8 - 27 mg/dL  20  21   Creatinine 9.42 - 1.00 mg/dL  9.07  8.98   Sodium 865 - 144 mmol/L  143  142   Potassium 3.5 - 5.2 mmol/L  4.3  4.5   Chloride 96 - 106 mmol/L  105  107   CO2 20 - 29 mmol/L  20  22   Calcium 8.7 - 10.3 mg/dL  89.9  9.7   Total Protein 6.5 - 8.1 g/dL 7.9  7.4  6.8   Total Bilirubin 0.0 - 1.2 mg/dL 0.6  0.4  0.5   Alkaline Phos 38 - 126 U/L 102  152    151  137   AST 15 - 41 U/L 24  18  22    ALT 0 - 44 U/L 23  18  29       RADIOGRAPHIC STUDIES: I have personally reviewed the radiological images as listed and agreed with the findings in the report. US  LT BREAST BX W LOC DEV 1ST LESION IMG BX SPEC US  GUIDE Addendum Date: 05/06/2024 ADDENDUM REPORT: 05/06/2024 13:32 ADDENDUM: PATHOLOGY revealed: 1. Breast, left, needle core biopsy, 11 o'clock, 12 cmfn, coil clip : - SOLID PAPILLARY CARCINOMA - SMALL FOCUS SUSPICIOUS FOR INVASION -  LYMPHOVASCULAR INVASION: NOT IDENTIFIED - CANCER LENGTH: 0.7 CM - CALCIFICATIONS: NOT IDENTIFIED. Pathology results are CONCORDANT with imaging findings, per Norleen Croak M.D. Pathology results and recommendations were discussed with patient via telephone on 05/02/2024 by Rock Hover RN. Patient reported biopsy site doing well with no adverse symptoms, and slight tenderness at the site. Post biopsy care instructions were reviewed, questions were answered and my direct phone number was provided. Patient was instructed to call [facility] for any additional questions or concerns related to biopsy site. RECOMMENDATIONS: 1. Surgical and oncological consultation. Request for surgical and  oncological consultation relayed to Shasta Ada RN at [facility] by Rock Hover RN on 05/02/2024. Pathology results reported by Rock Hover RN on 05/06/2024. Electronically Signed   By: Norleen Croak M.D.   On: 05/06/2024 13:32   Addendum Date: 05/06/2024 ADDENDUM REPORT: 05/06/2024 10:42 ADDENDUM: PATHOLOGY revealed: 1. Breast, left, needle core biopsy, 11 o'clock, 12 cmfn, coil clip : - SOLID PAPILLARY CARCINOMA - SMALL FOCUS SUSPICIOUS FOR INVASION - LYMPHOVASCULAR INVASION: NOT IDENTIFIED - CANCER LENGTH: 0.7 CM - CALCIFICATIONS: NOT IDENTIFIED. Pathology results are CONCORDANT with imaging findings, per Norleen Croak M.D. Pathology results and recommendations were discussed with patient via telephone on 05/01/2024 by Rock Hover RN. Patient reported biopsy site doing well with no adverse symptoms, and slight tenderness at the site. Post biopsy care instructions were reviewed, questions were answered and my direct phone number was provided. Patient was instructed to call [facility] for any additional questions or concerns related to biopsy site. RECOMMENDATIONS: 1. Surgical and oncological consultation. Request for surgical and oncological consultation relayed to Shasta Ada RN at [facility] by Rock Hover RN on 05/02/2024. Pathology results reported by Rock Hover RN on 05/06/2024. Electronically Signed   By: Norleen Croak M.D.   On: 05/06/2024 10:42   Result Date: 05/06/2024 CLINICAL DATA:  LEFT breast mass at 11 o'clock EXAM: ULTRASOUND GUIDED LEFT BREAST CORE NEEDLE BIOPSY COMPARISON:  Previous exam(s). PROCEDURE: I met with the patient and we discussed the procedure of ultrasound-guided biopsy, including benefits and alternatives. We discussed the high likelihood of a successful procedure. We discussed the risks of the procedure, including infection, bleeding, tissue injury, clip migration, and inadequate sampling. Informed written consent was given. The usual time-out protocol was performed immediately prior to the procedure. Lesion quadrant: Upper inner Using sterile technique and 1% lidocaine  and 1% lidocaine  with epinephrine  as local anesthetic, under direct ultrasound visualization, a 14 gauge spring-loaded device was used to perform biopsy of the LEFT breast mass at 11 o'clock using a anti radial approach. At the conclusion of the procedure coil shaped tissue marker clip was deployed into the biopsy cavity. Follow up 2 view mammogram was performed and dictated separately. IMPRESSION: Ultrasound guided biopsy of LEFT breast mass at 11 o'clock. No apparent complications. Electronically Signed: By: Norleen Croak M.D. On: 04/30/2024 08:51   MM CLIP PLACEMENT LEFT Result Date: 04/30/2024 CLINICAL DATA:  Same day LEFT breast biopsy EXAM: 3D DIAGNOSTIC LEFT MAMMOGRAM POST ULTRASOUND BIOPSY COMPARISON:  Previous exam(s). ACR Breast Density Category b: There are scattered areas of fibroglandular density. FINDINGS: 3D Mammographic images were obtained following ultrasound guided biopsy of the LEFT breast mass at 11 o'clock. The biopsy marking clip is in expected position at the site of biopsy. IMPRESSION: Appropriate positioning of the coil shaped biopsy marking clip at the site of biopsy  in the LEFT breast mass at 11 o'clock. Final Assessment: Post Procedure Mammograms for Marker Placement Electronically Signed   By: Norleen Croak M.D.   On: 04/30/2024 08:50   MM 3D DIAGNOSTIC MAMMOGRAM UNILATERAL LEFT BREAST Result Date: 04/28/2024 CLINICAL DATA:  Screening callback LEFT breast asymmetry EXAM: DIGITAL DIAGNOSTIC UNILATERAL LEFT MAMMOGRAM WITH TOMOSYNTHESIS AND CAD; ULTRASOUND LEFT BREAST LIMITED TECHNIQUE: Left digital diagnostic mammography and breast tomosynthesis was performed. The images were evaluated with computer-aided detection. ; Targeted ultrasound examination of the left breast was performed. COMPARISON:  Previous exam(s). ACR Breast Density Category b: There are scattered areas of fibroglandular density. FINDINGS: Repeat spot compression  views and full field images of the LEFT breast demonstrate an approximately 10 mm asymmetry versus irregular mass with indistinct margins in the upper inner quadrant posterior depth. Targeted ultrasound was performed and demonstrates an oval hypoechoic mass with indistinct margins measuring 8 x 4 x 6 mm at the 11 o'clock position 11 cm from the nipple. LEFT axillary ultrasound demonstrated normal lymph nodes with cortical thickness of less than 3 mm. IMPRESSION: Suspicious LEFT breast mass measuring 8 mm at the 11 o'clock position requires further characterization with ultrasound-guided biopsy. RECOMMENDATION: Ultrasound-guided biopsy of the LEFT breast. I have discussed the findings and recommendations with the patient. If applicable, a reminder letter will be sent to the patient regarding the next appointment. BI-RADS CATEGORY  4: Suspicious. Electronically Signed   By: Norleen Croak M.D.   On: 04/28/2024 10:43   US  LIMITED ULTRASOUND INCLUDING AXILLA LEFT BREAST  Result Date: 04/28/2024 CLINICAL DATA:  Screening callback LEFT breast asymmetry EXAM: DIGITAL DIAGNOSTIC UNILATERAL LEFT MAMMOGRAM WITH TOMOSYNTHESIS AND CAD; ULTRASOUND LEFT BREAST  LIMITED TECHNIQUE: Left digital diagnostic mammography and breast tomosynthesis was performed. The images were evaluated with computer-aided detection. ; Targeted ultrasound examination of the left breast was performed. COMPARISON:  Previous exam(s). ACR Breast Density Category b: There are scattered areas of fibroglandular density. FINDINGS: Repeat spot compression views and full field images of the LEFT breast demonstrate an approximately 10 mm asymmetry versus irregular mass with indistinct margins in the upper inner quadrant posterior depth. Targeted ultrasound was performed and demonstrates an oval hypoechoic mass with indistinct margins measuring 8 x 4 x 6 mm at the 11 o'clock position 11 cm from the nipple. LEFT axillary ultrasound demonstrated normal lymph nodes with cortical thickness of less than 3 mm. IMPRESSION: Suspicious LEFT breast mass measuring 8 mm at the 11 o'clock position requires further characterization with ultrasound-guided biopsy. RECOMMENDATION: Ultrasound-guided biopsy of the LEFT breast. I have discussed the findings and recommendations with the patient. If applicable, a reminder letter will be sent to the patient regarding the next appointment. BI-RADS CATEGORY  4: Suspicious. Electronically Signed   By: Norleen Croak M.D.   On: 04/28/2024 10:43

## 2024-05-16 NOTE — Assessment & Plan Note (Signed)
 Patient has a history of elevated alkaline phosphatase. Check LFT, GGT and isoenzymes.

## 2024-05-18 LAB — ALKALINE PHOSPHATASE, ISOENZYMES
Alk Phos Bone Fract: 35 % (ref 14–68)
Alk Phos Liver Fract: 65 % (ref 18–85)
Alk Phos: 119 IU/L (ref 44–121)
Intestinal %: 0 % (ref 0–18)

## 2024-05-19 ENCOUNTER — Encounter: Payer: Self-pay | Admitting: *Deleted

## 2024-05-19 ENCOUNTER — Inpatient Hospital Stay

## 2024-05-19 ENCOUNTER — Ambulatory Visit
Admission: RE | Admit: 2024-05-19 | Discharge: 2024-05-19 | Disposition: A | Discharge status: Home / Self Care | Source: Ambulatory Visit | Attending: General Surgery

## 2024-05-19 ENCOUNTER — Ambulatory Visit
Admission: RE | Admit: 2024-05-19 | Discharge: 2024-05-19 | Disposition: A | Discharge status: Home / Self Care | Source: Ambulatory Visit | Attending: General Surgery | Admitting: General Surgery

## 2024-05-19 DIAGNOSIS — C50919 Malignant neoplasm of unspecified site of unspecified female breast: Secondary | ICD-10-CM

## 2024-05-19 DIAGNOSIS — R92322 Mammographic fibroglandular density, left breast: Secondary | ICD-10-CM | POA: Diagnosis not present

## 2024-05-19 DIAGNOSIS — C50212 Malignant neoplasm of upper-inner quadrant of left female breast: Secondary | ICD-10-CM | POA: Insufficient documentation

## 2024-05-19 HISTORY — PX: BREAST BIOPSY: SHX20

## 2024-05-19 MED ORDER — LIDOCAINE HCL 1 % IJ SOLN
5.0000 mL | Freq: Once | INTRAMUSCULAR | Status: AC
  Administered 2024-05-19: 5 mL
  Filled 2024-05-19: qty 5

## 2024-05-19 NOTE — Progress Notes (Signed)
 Lumpectomy is scheduled for 8/4, she will see Dr. Babara and Dr. Lenn on 8/20.

## 2024-05-19 NOTE — Progress Notes (Signed)
 [facility] Clinical Social Work  Initial Assessment   Melinda Mack is a 73 y.o. year old female contacted by phone. Clinical Social Work was referred by medical provider for assessment of psychosocial needs.   SDOH (Social Determinants of Health) assessments performed: Yes SDOH Interventions    Flowsheet Row Clinical Support from 05/19/2024 in [facility] [place] Office Visit from 04/04/2024 in [facility] Clinical Support from 02/20/2024 in [facility] Clinical Support from 01/31/2023 in [facility] Clinical Support from 12/27/2021 in [facility] Clinical Support from 06/03/2020 in [facility]  SDOH Interventions        Food Insecurity Interventions -- -- Intervention Not Indicated Intervention Not Indicated Intervention Not Indicated --  Housing Interventions Intervention Not Indicated -- Intervention Not Indicated Intervention Not Indicated Intervention Not Indicated --  Transportation Interventions -- -- Intervention Not Indicated Intervention Not Indicated Intervention Not Indicated --  Utilities Interventions -- -- Intervention Not Indicated Intervention Not Indicated -- --  Alcohol Usage Interventions -- -- Intervention Not Indicated (Score <7) Intervention Not Indicated (Score <7) -- --  Depression Interventions/Treatment  -- PHQ2-9 Score <4 Follow-up Not Indicated PHQ2-9 Score <4 Follow-up Not Indicated -- -- --  Financial Strain Interventions -- -- Intervention Not Indicated Intervention Not Indicated Intervention Not Indicated --  Physical Activity Interventions -- -- Intervention Not Indicated Intervention Not Indicated Intervention Not Indicated Patient Refused  Stress Interventions -- -- Intervention Not Indicated Intervention Not Indicated Intervention Not Indicated --  Social Connections Interventions  -- -- Intervention Not Indicated Intervention Not Indicated Intervention Not Indicated --  Health Literacy Interventions -- -- Intervention Not Indicated -- -- --    SDOH Screenings   Food Insecurity: No Food Insecurity (05/16/2024)  Housing: Low Risk  (05/19/2024)  Recent Concern: Housing - High Risk (05/16/2024)  Transportation Needs: No Transportation Needs (05/16/2024)  Utilities: Not At Risk (05/16/2024)  Alcohol Screen: Low Risk  (02/20/2024)  Depression (PHQ2-9): Low Risk  (05/16/2024)  Financial Resource Strain: Low Risk  (05/12/2024)   Received from [facility]  Physical Activity: Insufficiently Active (04/04/2024)  Social Connections: Moderately Integrated (04/04/2024)  Stress: No Stress Concern Present (04/04/2024)  Tobacco Use: Medium Risk (05/16/2024)  Health Literacy: Adequate Health Literacy (02/20/2024)     Distress Screen completed: No    05/16/2024   11:59 AM  ONCBCN DISTRESS SCREENING  Screening Type Initial Screening  How much distress have you been experiencing in the past week? (0-10) 0      Family/Social Information:  Housing Arrangement: patient lives alone Family members/support persons in your life? Family and Church.  Patient has three daughters that live locally and are very supportive. Transportation concerns: no  Employment: Retired Income source: Actor concerns: No Type of concern: None Food access concerns: no Religious or spiritual practice: Yes-[employer].  Her church family is very supportive. Advanced directives: Yes-has HCPOA and Living Will inchart. Services Currently in place:  Medicare  Coping/ Adjustment to diagnosis: Patient understands treatment plan and what happens next? yes Concerns about diagnosis and/or treatment: Afraid of cancer Patient reported stressors: None Hopes and/or priorities: Family Patient enjoys time with family/ friends Current coping skills/ strengths: Active sense of  humor , Capable of independent living , Communication skills , Contractor , General fund of knowledge , Motivation for treatment/growth , Religious Affiliation , and  Supportive family/friends     SUMMARY: Current SDOH Barriers:  None per patient.  The at risk for housing was an error per patient.  Clinical Social Work Clinical Goal(s):  No clinical social work goals at this time  Interventions: Discussed common feeling and emotions when being diagnosed with cancer, and the importance of support during treatment Informed patient of the support team roles and support services at [facility] Provided CSW contact information and encouraged patient to call with any questions or concerns Provided patient with information about the [employer] program and mailed brochure.    Follow Up Plan: CSW will follow-up with patient by phone  Patient verbalizes understanding of plan: Yes    [name], LCSW Clinical Social Worker [facility]

## 2024-05-21 ENCOUNTER — Encounter
Admission: RE | Admit: 2024-05-21 | Discharge: 2024-05-21 | Disposition: A | Discharge status: Home / Self Care | Source: Ambulatory Visit | Attending: General Surgery | Admitting: General Surgery

## 2024-05-21 ENCOUNTER — Other Ambulatory Visit

## 2024-05-21 ENCOUNTER — Ambulatory Visit
Admission: RE | Admit: 2024-05-21 | Discharge: 2024-05-21 | Disposition: A | Discharge status: Home / Self Care | Source: Ambulatory Visit | Attending: General Surgery | Admitting: General Surgery

## 2024-05-21 DIAGNOSIS — R7303 Prediabetes: Secondary | ICD-10-CM | POA: Diagnosis not present

## 2024-05-21 DIAGNOSIS — E7800 Pure hypercholesterolemia, unspecified: Secondary | ICD-10-CM | POA: Diagnosis not present

## 2024-05-21 DIAGNOSIS — I444 Left anterior fascicular block: Secondary | ICD-10-CM | POA: Diagnosis not present

## 2024-05-21 DIAGNOSIS — Z01810 Encounter for preprocedural cardiovascular examination: Secondary | ICD-10-CM | POA: Diagnosis not present

## 2024-05-21 DIAGNOSIS — Z01818 Encounter for other preprocedural examination: Secondary | ICD-10-CM | POA: Diagnosis present

## 2024-05-21 DIAGNOSIS — C50212 Malignant neoplasm of upper-inner quadrant of left female breast: Secondary | ICD-10-CM | POA: Insufficient documentation

## 2024-05-25 MED ORDER — CHLORHEXIDINE GLUCONATE 0.12 % MT SOLN
15.0000 mL | Freq: Once | OROMUCOSAL | Status: AC
  Administered 2024-05-26: 15 mL via OROMUCOSAL

## 2024-05-25 MED ORDER — LACTATED RINGERS IV SOLN: INTRAVENOUS | Status: DC

## 2024-05-25 MED ORDER — CEFAZOLIN SODIUM-DEXTROSE 2-4 GM/100ML-% IV SOLN
2.0000 g | INTRAVENOUS | Status: AC
  Administered 2024-05-26: 2 g via INTRAVENOUS

## 2024-05-25 MED ORDER — ORAL CARE MOUTH RINSE: 15.0000 mL | Freq: Once | OROMUCOSAL | Status: AC

## 2024-05-26 ENCOUNTER — Encounter: Payer: Self-pay | Admitting: General Surgery

## 2024-05-26 ENCOUNTER — Ambulatory Visit
Admission: RE | Admit: 2024-05-26 | Discharge: 2024-05-26 | Disposition: A | Discharge status: Home / Self Care | Source: Ambulatory Visit | Attending: General Surgery | Admitting: General Surgery

## 2024-05-26 ENCOUNTER — Encounter
Admission: RE | Disposition: A | Discharge status: Home / Self Care | Payer: Self-pay | Source: Home / Self Care | Attending: General Surgery

## 2024-05-26 ENCOUNTER — Ambulatory Visit
Admission: RE | Admit: 2024-05-26 | Discharge: 2024-05-26 | Disposition: A | Discharge status: Home / Self Care | Attending: General Surgery | Admitting: General Surgery

## 2024-05-26 ENCOUNTER — Ambulatory Visit: Payer: Self-pay | Admitting: Urgent Care

## 2024-05-26 ENCOUNTER — Ambulatory Visit
Admission: RE | Admit: 2024-05-26 | Discharge: 2024-05-26 | Disposition: A | Discharge status: Home / Self Care | Source: Ambulatory Visit | Attending: General Surgery

## 2024-05-26 ENCOUNTER — Other Ambulatory Visit: Payer: Self-pay

## 2024-05-26 ENCOUNTER — Ambulatory Visit

## 2024-05-26 DIAGNOSIS — C50912 Malignant neoplasm of unspecified site of left female breast: Secondary | ICD-10-CM | POA: Insufficient documentation

## 2024-05-26 DIAGNOSIS — R7303 Prediabetes: Secondary | ICD-10-CM | POA: Diagnosis not present

## 2024-05-26 DIAGNOSIS — E039 Hypothyroidism, unspecified: Secondary | ICD-10-CM | POA: Insufficient documentation

## 2024-05-26 DIAGNOSIS — E669 Obesity, unspecified: Secondary | ICD-10-CM | POA: Insufficient documentation

## 2024-05-26 DIAGNOSIS — Z6827 Body mass index (BMI) 27.0-27.9, adult: Secondary | ICD-10-CM | POA: Insufficient documentation

## 2024-05-26 DIAGNOSIS — C50212 Malignant neoplasm of upper-inner quadrant of left female breast: Secondary | ICD-10-CM

## 2024-05-26 DIAGNOSIS — Z87891 Personal history of nicotine dependence: Secondary | ICD-10-CM | POA: Diagnosis not present

## 2024-05-26 DIAGNOSIS — K219 Gastro-esophageal reflux disease without esophagitis: Secondary | ICD-10-CM | POA: Diagnosis not present

## 2024-05-26 DIAGNOSIS — Z8614 Personal history of Methicillin resistant Staphylococcus aureus infection: Secondary | ICD-10-CM | POA: Diagnosis not present

## 2024-05-26 HISTORY — PX: BREAST LUMPECTOMY WITH RADIO FREQUENCY LOCALIZER: SHX6897

## 2024-05-26 HISTORY — PX: AXILLARY SENTINEL NODE BIOPSY: SHX5738

## 2024-05-26 SURGERY — BREAST LUMPECTOMY WITH RADIO FREQUENCY LOCALIZER
Anesthesia: General | Laterality: Left

## 2024-05-26 MED ORDER — GLYCOPYRROLATE 0.2 MG/ML IJ SOLN
INTRAMUSCULAR | Status: AC
  Filled 2024-05-26: qty 1

## 2024-05-26 MED ORDER — MIDAZOLAM HCL 2 MG/2ML IJ SOLN
INTRAMUSCULAR | Status: AC
  Filled 2024-05-26: qty 2

## 2024-05-26 MED ORDER — DEXAMETHASONE SODIUM PHOSPHATE 10 MG/ML IJ SOLN
INTRAMUSCULAR | Status: DC | PRN
  Administered 2024-05-26: 10 mg via INTRAVENOUS

## 2024-05-26 MED ORDER — FENTANYL CITRATE (PF) 100 MCG/2ML IJ SOLN: 25.0000 ug | INTRAMUSCULAR | Status: DC | PRN

## 2024-05-26 MED ORDER — ONDANSETRON HCL 4 MG/2ML IJ SOLN
INTRAMUSCULAR | Status: AC
  Filled 2024-05-26: qty 2

## 2024-05-26 MED ORDER — LIDOCAINE HCL (CARDIAC) PF 100 MG/5ML IV SOSY
PREFILLED_SYRINGE | INTRAVENOUS | Status: DC | PRN
  Administered 2024-05-26: 100 mg via INTRAVENOUS

## 2024-05-26 MED ORDER — TRAMADOL HCL 50 MG PO TABS: 50.0000 mg | ORAL_TABLET | Freq: Four times a day (QID) | ORAL | 0 refills | Status: DC | PRN

## 2024-05-26 MED ORDER — GLYCOPYRROLATE 0.2 MG/ML IJ SOLN
INTRAMUSCULAR | Status: DC | PRN
  Administered 2024-05-26: .2 mg via INTRAVENOUS

## 2024-05-26 MED ORDER — BUPIVACAINE-EPINEPHRINE (PF) 0.5% -1:200000 IJ SOLN
INTRAMUSCULAR | Status: AC
  Filled 2024-05-26: qty 30

## 2024-05-26 MED ORDER — METHYLENE BLUE (ANTIDOTE) 1 % IV SOLN
INTRAVENOUS | Status: AC
  Filled 2024-05-26: qty 10

## 2024-05-26 MED ORDER — DEXAMETHASONE SODIUM PHOSPHATE 10 MG/ML IJ SOLN
INTRAMUSCULAR | Status: AC
  Filled 2024-05-26: qty 1

## 2024-05-26 MED ORDER — CHLORHEXIDINE GLUCONATE 0.12 % MT SOLN
OROMUCOSAL | Status: AC
  Filled 2024-05-26: qty 15

## 2024-05-26 MED ORDER — OXYCODONE HCL 5 MG PO TABS: 5.0000 mg | ORAL_TABLET | Freq: Once | ORAL | Status: DC | PRN

## 2024-05-26 MED ORDER — TECHNETIUM TC 99M TILMANOCEPT KIT
0.9800 | PACK | Freq: Once | INTRAVENOUS | Status: AC | PRN
  Administered 2024-05-26: 0.98 via INTRADERMAL

## 2024-05-26 MED ORDER — MIDAZOLAM HCL 2 MG/2ML IJ SOLN
INTRAMUSCULAR | Status: DC | PRN
  Administered 2024-05-26: 2 mg via INTRAVENOUS

## 2024-05-26 MED ORDER — BUPIVACAINE-EPINEPHRINE (PF) 0.5% -1:200000 IJ SOLN
INTRAMUSCULAR | Status: DC | PRN
  Administered 2024-05-26: 15 mL

## 2024-05-26 MED ORDER — PROPOFOL 1000 MG/100ML IV EMUL
INTRAVENOUS | Status: AC
  Filled 2024-05-26: qty 100

## 2024-05-26 MED ORDER — LIDOCAINE HCL (PF) 2 % IJ SOLN
INTRAMUSCULAR | Status: AC
  Filled 2024-05-26: qty 5

## 2024-05-26 MED ORDER — PROPOFOL 10 MG/ML IV BOLUS
INTRAVENOUS | Status: DC | PRN
Start: 2024-05-26 — End: 2024-05-26
  Administered 2024-05-26: 50 mg via INTRAVENOUS
  Administered 2024-05-26: 125 ug/kg/min via INTRAVENOUS

## 2024-05-26 MED ORDER — ONDANSETRON HCL 4 MG/2ML IJ SOLN
INTRAMUSCULAR | Status: DC | PRN
Start: 2024-05-26 — End: 2024-05-26
  Administered 2024-05-26: 4 mg via INTRAVENOUS

## 2024-05-26 MED ORDER — CEFAZOLIN SODIUM-DEXTROSE 2-4 GM/100ML-% IV SOLN
INTRAVENOUS | Status: AC
  Filled 2024-05-26: qty 100

## 2024-05-26 MED ORDER — OXYCODONE HCL 5 MG/5ML PO SOLN: 5.0000 mg | Freq: Once | ORAL | Status: DC | PRN

## 2024-05-26 SURGICAL SUPPLY — 32 items
BLADE SURG 15 STRL LF DISP TIS (BLADE) ×1 IMPLANT
CHLORAPREP W/TINT 26 (MISCELLANEOUS) IMPLANT
COVER PROBE GAMMA FINDER SLV (MISCELLANEOUS) ×1 IMPLANT
DERMABOND ADVANCED .7 DNX12 (GAUZE/BANDAGES/DRESSINGS) ×1 IMPLANT
DEVICE DUBIN SPECIMEN MAMMOGRA (MISCELLANEOUS) ×1 IMPLANT
DRAPE LAPAROTOMY 77X122 PED (DRAPES) ×1 IMPLANT
DRAPE LAPAROTOMY TRNSV 106X77 (MISCELLANEOUS) IMPLANT
ELECTRODE REM PT RTRN 9FT ADLT (ELECTROSURGICAL) ×1 IMPLANT
GLOVE BIO SURGEON STRL SZ 6.5 (GLOVE) ×1 IMPLANT
GLOVE BIOGEL PI IND STRL 6.5 (GLOVE) ×1 IMPLANT
GLOVE SURG SYN 6.5 PF PI (GLOVE) ×2 IMPLANT
GOWN STRL REUS W/ TWL LRG LVL3 (GOWN DISPOSABLE) ×4 IMPLANT
KIT MARKER MARGIN INK (KITS) IMPLANT
KIT TURNOVER KIT A (KITS) ×1 IMPLANT
LABEL OR SOLS (LABEL) ×1 IMPLANT
MANIFOLD NEPTUNE II (INSTRUMENTS) ×1 IMPLANT
MARKER MARGIN CORRECT CLIP (MARKER) IMPLANT
NDL HYPO 22X1.5 SAFETY MO (MISCELLANEOUS) ×1 IMPLANT
NDL SAFETY ECLIPSE 18X1.5 (NEEDLE) IMPLANT
NEEDLE HYPO 22X1.5 SAFETY MO (MISCELLANEOUS) ×1 IMPLANT
PACK BASIN MINOR ARMC (MISCELLANEOUS) ×1 IMPLANT
RETRACTOR RING XSMALL (MISCELLANEOUS) IMPLANT
SHEATH BREAST BIOPSY SKIN MKR (SHEATH) ×1 IMPLANT
SUT SILK 2 0 SH (SUTURE) IMPLANT
SUT VIC AB 3-0 SH 27X BRD (SUTURE) ×1 IMPLANT
SUTURE MNCRL 4-0 27XMF (SUTURE) ×1 IMPLANT
SYR 10ML LL (SYRINGE) ×1 IMPLANT
SYR BULB IRRIG 60ML STRL (SYRINGE) ×1 IMPLANT
TRAP FLUID SMOKE EVACUATOR (MISCELLANEOUS) ×1 IMPLANT
TRAP NEPTUNE SPECIMEN COLLECT (MISCELLANEOUS) ×1 IMPLANT
WATER STERILE IRR 1000ML POUR (IV SOLUTION) ×1 IMPLANT
WATER STERILE IRR 500ML POUR (IV SOLUTION) ×1 IMPLANT

## 2024-05-26 NOTE — Discharge Instructions (Addendum)

## 2024-05-26 NOTE — Interval H&P Note (Signed)
 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  The various methods of treatment have been discussed with the patient and family. After consideration of risks, benefits and other options for treatment, the patient has consented to  Procedure(s): BREAST LUMPECTOMY WITH RADIO FREQUENCY LOCALIZER (Left) BIOPSY, LYMPH NODE, SENTINEL, AXILLARY (Left) as a surgical intervention.  The patient's history has been reviewed, patient examined, no change in status, stable for surgery.  I have reviewed the patient's chart and labs.  Questions were answered to the patient's satisfaction.     Lucas Sjogren

## 2024-05-26 NOTE — Anesthesia Postprocedure Evaluation (Signed)
 Anesthesia Post Note  Patient: Melinda Mack  Procedure(s) Performed: BREAST LUMPECTOMY WITH RADIO FREQUENCY LOCALIZER (Left) BIOPSY, LYMPH NODE, SENTINEL, AXILLARY (Left)  Patient location during evaluation: PACU Anesthesia Type: General Level of consciousness: awake and alert Pain management: pain level controlled Vital Signs Assessment: post-procedure vital signs reviewed and stable Respiratory status: spontaneous breathing, nonlabored ventilation, respiratory function stable and patient connected to nasal cannula oxygen Cardiovascular status: blood pressure returned to baseline and stable Postop Assessment: no apparent nausea or vomiting Anesthetic complications: no   There were no known notable events for this encounter.   Last Vitals:  Vitals:   05/26/24 1121 05/26/24 1133  BP: (!) 180/79 (!) 158/73  Pulse: 61   Resp: 15   Temp: (!) 36.2 C   SpO2: 97%     Last Pain:  Vitals:   05/26/24 1121  TempSrc: Temporal  PainSc: 2                  Debby Mines

## 2024-05-26 NOTE — Anesthesia Preprocedure Evaluation (Signed)
 Anesthesia Evaluation  Patient identified by MRN, date of birth, ID band Patient awake    Reviewed: Allergy & Precautions, NPO status , Patient's Chart, lab work & pertinent test results  Airway Mallampati: III  TM Distance: >3 FB Neck ROM: full    Dental  (+) Chipped   Pulmonary neg pulmonary ROS, former smoker   Pulmonary exam normal        Cardiovascular negative cardio ROS Normal cardiovascular exam     Neuro/Psych negative neurological ROS  negative psych ROS   GI/Hepatic negative GI ROS, Neg liver ROS,,,  Endo/Other  Hypothyroidism    Renal/GU      Musculoskeletal   Abdominal   Peds  Hematology negative hematology ROS (+)   Anesthesia Other Findings Past Medical History: No date: Anemia No date: Anxiety No date: Breast cancer, left (HCC) No date: CRP elevated No date: Depression No date: Elevated alkaline phosphatase level No date: GERD (gastroesophageal reflux disease) No date: History of chicken pox No date: History of endometrial cancer No date: History of measles No date: History of methicillin resistant staphylococcus aureus (MRSA) No date: History of mumps No date: History of tobacco use No date: Hypercholesterolemia No date: Hypothyroidism No date: Obesity No date: Osteoarthritis No date: Pre-diabetes No date: Recurrent cystitis  Past Surgical History: 04/18/2012: ABDOMINAL HYSTERECTOMY     Comment:  cancer at [facility] 03/2012: BILATERAL SALPINGOOPHORECTOMY 05/08/2016: BREAST BIOPSY; Left     Comment:  neg- [facility] 10/2015: BREAST BIOPSY; Right     Comment:  neg- [facility] 04/30/2024: BREAST BIOPSY; Left     Comment:  US  LT BREAST BX W LOC DEV 1ST LESION IMG BX SPEC US                GUIDE 04/30/2024 [facility] 05/19/2024: BREAST BIOPSY; Left     Comment:  US  LT BREAST SAVI/RF TAG 1ST LESION US  GUIDE 05/19/2024               [facility] 03/2012: LYMPHADENECTOMY 1984: MANDIBLE  FRACTURE SURGERY     Comment:  TMJ 1970: TONSILLECTOMY AND ADENOIDECTOMY 1978: TUBAL LIGATION No date: US  LT BREAST BX W LOC DEV 1ST LESION IMG BX SPEC US  GUIDE;  Left     Comment:  US  BX LEFT BREAST COIL CLIP-PATH PENDING  BMI    Body Mass Index: 27.04 kg/m      Reproductive/Obstetrics negative OB ROS                              Anesthesia Physical Anesthesia Plan  ASA: 2  Anesthesia Plan: General   Post-op Pain Management:    Induction: Intravenous  PONV Risk Score and Plan: 3 and Ondansetron , Dexamethasone  and Midazolam   Airway Management Planned: LMA  Additional Equipment:   Intra-op Plan:   Post-operative Plan: Extubation in OR  Informed Consent: I have reviewed the patients History and Physical, chart, labs and discussed the procedure including the risks, benefits and alternatives for the proposed anesthesia with the patient or authorized representative who has indicated his/her understanding and acceptance.     Dental Advisory Given  Plan Discussed with: Anesthesiologist, CRNA and Surgeon  Anesthesia Plan Comments: (Patient consented for risks of anesthesia including but not limited to:  - adverse reactions to medications - damage to eyes, teeth, lips or other oral mucosa - nerve damage due to positioning  - sore throat or hoarseness - Damage to heart, brain, nerves, lungs, other parts of body or  loss of life  Patient voiced understanding and assent.)        Anesthesia Quick Evaluation

## 2024-05-26 NOTE — Transfer of Care (Signed)
 Immediate Anesthesia Transfer of Care Note  Patient: Melinda Mack  Procedure(s) Performed: BREAST LUMPECTOMY WITH RADIO FREQUENCY LOCALIZER (Left) BIOPSY, LYMPH NODE, SENTINEL, AXILLARY (Left)  Patient Location: PACU  Anesthesia Type:General  Level of Consciousness: awake  Airway & Oxygen Therapy: Patient Spontanous Breathing  Post-op Assessment: Report given to RN and Post -op Vital signs reviewed and stable  Post vital signs: Reviewed and stable  Last Vitals:  Vitals Value Taken Time  BP 129/75 05/26/24 10:15  Temp    Pulse 86 05/26/24 10:17  Resp 14 05/26/24 10:17  SpO2 98 % 05/26/24 10:17  Vitals shown include unfiled device data.  Last Pain:  Vitals:   05/26/24 0830  PainSc: 0-No pain         Complications: There were no known notable events for this encounter.

## 2024-05-26 NOTE — Anesthesia Procedure Notes (Signed)
 Procedure Name: LMA Insertion Date/Time: 05/26/2024 9:10 AM  Performed by: Cyril Woodmansee R, CRNAPre-anesthesia Checklist: Patient identified, Emergency Drugs available, Suction available, Patient being monitored and Timeout performed Patient Re-evaluated:Patient Re-evaluated prior to induction Oxygen Delivery Method: Circle system utilized Preoxygenation: Pre-oxygenation with 100% oxygen Induction Type: IV induction LMA: LMA inserted LMA Size: 3.0 Number of attempts: 1 Placement Confirmation: positive ETCO2 and breath sounds checked- equal and bilateral Tube secured with: Tape Dental Injury: Teeth and Oropharynx as per pre-operative assessment

## 2024-05-26 NOTE — Op Note (Signed)
 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  Postoperative diagnosis: Same  Procedure: Left radiofrequency tag-localized partial mastectomy.                      Left Axillary Sentinel Lymph node biopsy  Anesthesia: GETA  Surgeon: Dr. Cesar Coe  Wound Classification: Clean  Indications: Patient is a 73 y.o. female with a nonpalpable left breast mass noted on mammography with core biopsy demonstrating solid papillary carcinoma requires radiofrequency tag-localized partial mastectomy for treatment with sentinel lymph node biopsy.   Findings: 1. Specimen mammography shows marker and tag on specimen 2. Pathology call refers gross examination of margins was grossly negative 3. No other palpable mass or lymph node identified.   Description of procedure: Preoperative radiofrequency tag localization was performed by radiology. In the nuclear medicine suite, the subareolar region was injected with Tc-99 sulfur colloid. Localization studies were reviewed. The patient was taken to the operating room and placed supine on the operating table, and after general anesthesia the left chest and axilla were prepped and draped in the usual sterile fashion. A time-out was completed verifying correct patient, procedure, site, positioning, and implant(s) and/or special equipment prior to beginning this procedure.  By comparing the localization studies and interrogation with [facility] device, the probable trajectory and location of the mass was visualized. A circumareolar skin incision was planned in such a way as to minimize the amount of dissection to reach the mass.  The skin incision was made. Flaps were raised and the location of the tag was confirmed with [facility] device confirmed. A 2-0 silk figure-of-eight stay suture was placed and used for retraction. Dissection was then taken down circumferentially, taking care to include the entire localizing tag and a wide margin of grossly normal  tissue. The specimen and entire localizing tag were removed. The specimen was oriented and sent to radiology with the localization studies. Confirmation was received that the entire target lesion had been resected. The wound was irrigated. Hemostasis was checked. The wound was closed with interrupted sutures of 3-0 Vicryl and a subcuticular suture of Monocryl 3-0. No attempt was made to close the dead space.   A hand-held gamma probe was used to identify the location of the hottest spot in the axilla. An incision was made around the caudal axillary hairline. Dissection was carried down until subdermal facias was advanced. The probe was placed and again, the point of maximal count was found. Dissection continue until nodule was identified. The probe was placed in contact with the node. The node was excised in its entirety.  An additional hot spot was detected and the node was excised in similar fashion. No additional hot spots were identified. No clinically abnormal nodes were palpated. The procedure was terminated. Hemostasis was achieved and the wound closed in layers with deep interrupted 3-0 Vicryl and skin was closed with subcuticular suture of Monocryl 3-0.  The patient tolerated the procedure well and was taken to the postanesthesia care unit in stable condition.    Sentinel Node Biopsy Synoptic Operative Report  Operation performed with curative intent:Yes  Tracer(s) used to identify sentinel nodes in the upfront surgery (non-neoadjuvant) setting (select all that apply):Radioactive Tracer  Tracer(s) used to identify sentinel nodes in the neoadjuvant setting (select all that apply):N/A  All nodes (colored or non-colored) present at the end of a dye-filled lymphatic channel were removed:N/A  All significantly radioactive nodes were removed:Yes  All palpable suspicious nodes were removed:N/A  Biopsy-proven positive  nodes marked with clips prior to chemotherapy were identified and  removed:N/A  Specimen: Left Breast mass                     Sentinel Lymph nodes #1, #2  Complications: None  Estimated Blood Loss: 10 mL

## 2024-05-27 ENCOUNTER — Encounter: Payer: Self-pay | Admitting: General Surgery

## 2024-05-28 ENCOUNTER — Inpatient Hospital Stay: Attending: Oncology

## 2024-05-28 DIAGNOSIS — E039 Hypothyroidism, unspecified: Secondary | ICD-10-CM | POA: Insufficient documentation

## 2024-05-28 DIAGNOSIS — Z90710 Acquired absence of both cervix and uterus: Secondary | ICD-10-CM | POA: Insufficient documentation

## 2024-05-28 DIAGNOSIS — Z8542 Personal history of malignant neoplasm of other parts of uterus: Secondary | ICD-10-CM | POA: Insufficient documentation

## 2024-05-28 DIAGNOSIS — Z17421 Hormone receptor negative with human epidermal growth factor receptor 2 negative status: Secondary | ICD-10-CM | POA: Insufficient documentation

## 2024-05-28 DIAGNOSIS — Z880 Allergy status to penicillin: Secondary | ICD-10-CM | POA: Insufficient documentation

## 2024-05-28 DIAGNOSIS — Z881 Allergy status to other antibiotic agents status: Secondary | ICD-10-CM | POA: Insufficient documentation

## 2024-05-28 DIAGNOSIS — Z79899 Other long term (current) drug therapy: Secondary | ICD-10-CM | POA: Insufficient documentation

## 2024-05-28 DIAGNOSIS — Z87891 Personal history of nicotine dependence: Secondary | ICD-10-CM | POA: Insufficient documentation

## 2024-05-28 DIAGNOSIS — Z818 Family history of other mental and behavioral disorders: Secondary | ICD-10-CM | POA: Insufficient documentation

## 2024-05-28 DIAGNOSIS — M1990 Unspecified osteoarthritis, unspecified site: Secondary | ICD-10-CM | POA: Insufficient documentation

## 2024-05-28 DIAGNOSIS — Z825 Family history of asthma and other chronic lower respiratory diseases: Secondary | ICD-10-CM | POA: Insufficient documentation

## 2024-05-28 DIAGNOSIS — R748 Abnormal levels of other serum enzymes: Secondary | ICD-10-CM | POA: Insufficient documentation

## 2024-05-28 DIAGNOSIS — R7401 Elevation of levels of liver transaminase levels: Secondary | ICD-10-CM | POA: Insufficient documentation

## 2024-05-28 DIAGNOSIS — Z79890 Hormone replacement therapy (postmenopausal): Secondary | ICD-10-CM | POA: Insufficient documentation

## 2024-05-28 DIAGNOSIS — Z888 Allergy status to other drugs, medicaments and biological substances status: Secondary | ICD-10-CM | POA: Insufficient documentation

## 2024-05-28 DIAGNOSIS — Z885 Allergy status to narcotic agent status: Secondary | ICD-10-CM | POA: Insufficient documentation

## 2024-05-28 DIAGNOSIS — Z170 Estrogen receptor positive status [ER+]: Secondary | ICD-10-CM | POA: Insufficient documentation

## 2024-05-28 DIAGNOSIS — N6322 Unspecified lump in the left breast, upper inner quadrant: Secondary | ICD-10-CM | POA: Insufficient documentation

## 2024-05-28 DIAGNOSIS — E7800 Pure hypercholesterolemia, unspecified: Secondary | ICD-10-CM | POA: Insufficient documentation

## 2024-05-28 DIAGNOSIS — Z90722 Acquired absence of ovaries, bilateral: Secondary | ICD-10-CM | POA: Insufficient documentation

## 2024-05-28 DIAGNOSIS — K219 Gastro-esophageal reflux disease without esophagitis: Secondary | ICD-10-CM | POA: Insufficient documentation

## 2024-05-28 DIAGNOSIS — M8580 Other specified disorders of bone density and structure, unspecified site: Secondary | ICD-10-CM | POA: Insufficient documentation

## 2024-05-28 DIAGNOSIS — Z8249 Family history of ischemic heart disease and other diseases of the circulatory system: Secondary | ICD-10-CM | POA: Insufficient documentation

## 2024-05-28 DIAGNOSIS — Z803 Family history of malignant neoplasm of breast: Secondary | ICD-10-CM | POA: Insufficient documentation

## 2024-05-28 DIAGNOSIS — Z8614 Personal history of Methicillin resistant Staphylococcus aureus infection: Secondary | ICD-10-CM | POA: Insufficient documentation

## 2024-05-28 DIAGNOSIS — Z801 Family history of malignant neoplasm of trachea, bronchus and lung: Secondary | ICD-10-CM | POA: Insufficient documentation

## 2024-05-28 DIAGNOSIS — C50212 Malignant neoplasm of upper-inner quadrant of left female breast: Secondary | ICD-10-CM | POA: Insufficient documentation

## 2024-05-28 DIAGNOSIS — Z1732 Human epidermal growth factor receptor 2 negative status: Secondary | ICD-10-CM | POA: Insufficient documentation

## 2024-05-28 DIAGNOSIS — Z1721 Progesterone receptor positive status: Secondary | ICD-10-CM | POA: Insufficient documentation

## 2024-05-28 DIAGNOSIS — Z882 Allergy status to sulfonamides status: Secondary | ICD-10-CM | POA: Insufficient documentation

## 2024-05-28 LAB — SURGICAL PATHOLOGY

## 2024-06-10 NOTE — Progress Notes (Signed)
   History of Present Illness Melinda Mack is a 73 year old female who presents for a post-operative evaluation following a partial mastectomy.  She underwent a partial mastectomy with a telamino biopsy on May 26, 2024. The final pathology revealed invasive solid papillary carcinoma, grade two, with negative margins for carcinoma and no lymphovascular invasion. Two sentinel lymph nodes were examined and found negative for carcinoma.  Post-operatively, she experiences soreness extending from the surgical site to her armpit, accompanied by a burning and itching sensation in her armpit. She also had a rash that has since resolved, leaving a mark resembling the shape of tape, likely due to irritation from surgical tape used during the procedure.  She notes swelling and discomfort in her arm. She is currently able to perform activities such as vacuuming, indicating some level of functional recovery.  Soreness and discomfort persist in the arm and armpit area, as well as a resolved rash that left a mark.   PAST SURGICAL HISTORY:   Past Surgical History:  Procedure Laterality Date  . LAPAROSCOPIC HYSTERECTOMY TOTAL ROBOTIC  04/08/2012  . MASTECTOMY PARTIAL Left 05/26/2024   Dr Lucas Catchings -- w/ RF & SN         PHYSICAL EXAM:  There were no vitals filed for this visit.[name]  Ht:  Wt:  ADJ:Uyzmz is no height or weight on file to calculate BSA. There is no height or weight on file to calculate BMI.[name]   GENERAL: Alert, active, oriented x3  BREAST: Breast incision is healing adequately.  There is no seroma.  Left axillary incision with small seroma without any surrounding changes.   Assessment & Plan Left breast invasive solid papillary carcinoma, post-partial mastectomy   Post-operative evaluation shows healing is progressing well with no signs of infection following partial mastectomy for left breast invasive papillary carcinoma. Final pathology indicates invasive papillary carcinoma, 4.5  mm, grade 2, with negative margins and no lymphovascular invasion. Sentinel lymph nodes are negative for carcinoma. Pathology results suggest a favorable prognosis. Continue post-operative care and discuss next steps with the oncologist during the upcoming appointment.  Left upper extremity and axillary postoperative pain and swelling   Postoperative pain and swelling (seroma) in the left upper extremity and axillary region are likely due to inflammation from surgery, causing burning and itching sensations. No signs of infection are present, and symptoms are expected to improve over time. Advise on activity as tolerated, avoiding movements that cause pain. Schedule follow-up in two weeks to assess healing and symptom progression.  Malignant neoplasm of upper-inner quadrant of left female breast, unspecified estrogen receptor status (CMS/HHS-HCC) [C50.212]         Patient verbalized understanding, all questions were answered, and were agreeable with the plan outlined above.   Lucas Sjogren, MD

## 2024-06-11 ENCOUNTER — Encounter: Payer: Self-pay | Admitting: Oncology

## 2024-06-11 ENCOUNTER — Ambulatory Visit
Admission: RE | Admit: 2024-06-11 | Discharge: 2024-06-11 | Disposition: A | Discharge status: Home / Self Care | Source: Ambulatory Visit | Attending: Radiation Oncology | Admitting: Radiation Oncology

## 2024-06-11 ENCOUNTER — Inpatient Hospital Stay (HOSPITAL_BASED_OUTPATIENT_CLINIC_OR_DEPARTMENT_OTHER): Admitting: Oncology

## 2024-06-11 ENCOUNTER — Encounter: Payer: Self-pay | Admitting: Radiation Oncology

## 2024-06-11 VITALS — BP 124/79 | HR 66 | Temp 97.1°F | Resp 18 | Wt 159.5 lb

## 2024-06-11 DIAGNOSIS — R748 Abnormal levels of other serum enzymes: Secondary | ICD-10-CM | POA: Diagnosis not present

## 2024-06-11 DIAGNOSIS — Z888 Allergy status to other drugs, medicaments and biological substances status: Secondary | ICD-10-CM | POA: Diagnosis not present

## 2024-06-11 DIAGNOSIS — C50919 Malignant neoplasm of unspecified site of unspecified female breast: Secondary | ICD-10-CM

## 2024-06-11 DIAGNOSIS — M8580 Other specified disorders of bone density and structure, unspecified site: Secondary | ICD-10-CM | POA: Diagnosis not present

## 2024-06-11 DIAGNOSIS — Z79899 Other long term (current) drug therapy: Secondary | ICD-10-CM | POA: Insufficient documentation

## 2024-06-11 DIAGNOSIS — Z79890 Hormone replacement therapy (postmenopausal): Secondary | ICD-10-CM | POA: Insufficient documentation

## 2024-06-11 DIAGNOSIS — R7401 Elevation of levels of liver transaminase levels: Secondary | ICD-10-CM | POA: Insufficient documentation

## 2024-06-11 DIAGNOSIS — E039 Hypothyroidism, unspecified: Secondary | ICD-10-CM | POA: Diagnosis not present

## 2024-06-11 DIAGNOSIS — E7800 Pure hypercholesterolemia, unspecified: Secondary | ICD-10-CM | POA: Diagnosis not present

## 2024-06-11 DIAGNOSIS — Z1721 Progesterone receptor positive status: Secondary | ICD-10-CM | POA: Diagnosis not present

## 2024-06-11 DIAGNOSIS — Z8249 Family history of ischemic heart disease and other diseases of the circulatory system: Secondary | ICD-10-CM | POA: Diagnosis not present

## 2024-06-11 DIAGNOSIS — Z880 Allergy status to penicillin: Secondary | ICD-10-CM | POA: Diagnosis not present

## 2024-06-11 DIAGNOSIS — Z881 Allergy status to other antibiotic agents status: Secondary | ICD-10-CM | POA: Diagnosis not present

## 2024-06-11 DIAGNOSIS — Z87891 Personal history of nicotine dependence: Secondary | ICD-10-CM | POA: Insufficient documentation

## 2024-06-11 DIAGNOSIS — Z90722 Acquired absence of ovaries, bilateral: Secondary | ICD-10-CM | POA: Diagnosis not present

## 2024-06-11 DIAGNOSIS — Z882 Allergy status to sulfonamides status: Secondary | ICD-10-CM | POA: Diagnosis not present

## 2024-06-11 DIAGNOSIS — Z90710 Acquired absence of both cervix and uterus: Secondary | ICD-10-CM | POA: Diagnosis not present

## 2024-06-11 DIAGNOSIS — K219 Gastro-esophageal reflux disease without esophagitis: Secondary | ICD-10-CM | POA: Insufficient documentation

## 2024-06-11 DIAGNOSIS — Z170 Estrogen receptor positive status [ER+]: Secondary | ICD-10-CM | POA: Diagnosis not present

## 2024-06-11 DIAGNOSIS — C50212 Malignant neoplasm of upper-inner quadrant of left female breast: Secondary | ICD-10-CM | POA: Insufficient documentation

## 2024-06-11 DIAGNOSIS — Z885 Allergy status to narcotic agent status: Secondary | ICD-10-CM | POA: Diagnosis not present

## 2024-06-11 DIAGNOSIS — Z1732 Human epidermal growth factor receptor 2 negative status: Secondary | ICD-10-CM | POA: Diagnosis not present

## 2024-06-11 DIAGNOSIS — Z171 Estrogen receptor negative status [ER-]: Secondary | ICD-10-CM | POA: Diagnosis not present

## 2024-06-11 DIAGNOSIS — Z803 Family history of malignant neoplasm of breast: Secondary | ICD-10-CM | POA: Diagnosis not present

## 2024-06-11 DIAGNOSIS — Z8614 Personal history of Methicillin resistant Staphylococcus aureus infection: Secondary | ICD-10-CM | POA: Diagnosis not present

## 2024-06-11 DIAGNOSIS — Z17421 Hormone receptor negative with human epidermal growth factor receptor 2 negative status: Secondary | ICD-10-CM | POA: Insufficient documentation

## 2024-06-11 DIAGNOSIS — M1990 Unspecified osteoarthritis, unspecified site: Secondary | ICD-10-CM | POA: Diagnosis not present

## 2024-06-11 DIAGNOSIS — Z8542 Personal history of malignant neoplasm of other parts of uterus: Secondary | ICD-10-CM | POA: Diagnosis not present

## 2024-06-11 DIAGNOSIS — N6322 Unspecified lump in the left breast, upper inner quadrant: Secondary | ICD-10-CM | POA: Diagnosis not present

## 2024-06-11 NOTE — Assessment & Plan Note (Addendum)
 Recommend calcium and vitamin D  supplementation. 03/2004 DEXA showed osteopenia with FRAX 10-year major osteoporotic fracture risk about 10%. Consider tamoxifen versus aromatase inhibitor with bone strengthening agents.

## 2024-06-11 NOTE — Assessment & Plan Note (Addendum)
 Invasive solid papillary carcinoma status post lumpectomy and sentinel lymph node biopsy ER 100%, PR 100%, HER2 negative  Recommend adjuvant radiation followed by adjuvant endocrine therapy.

## 2024-06-11 NOTE — Progress Notes (Signed)
 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  Elevated alkaline phosphatase measurement Patient has a history of elevated alkaline phosphatase. Please repeat LFT, patient alkaline phosphatase has normalized.  I will hold off additional testing.  Osteopenia Recommend calcium and vitamin D  supplementation. 03/2004 DEXA showed osteopenia with FRAX 10-year major osteoporotic fracture risk about 10%. Consider tamoxifen versus aromatase inhibitor with bone strengthening agents.   No orders of the defined types were placed in this encounter.  Follow-up a few weeks after radiation.    All questions were answered. The patient knows to call the clinic with any problems, questions or concerns.  Zelphia Cap, MD, PhD [facility] Hematology Oncology 06/11/2024    HISTORY OF PRESENTING ILLNESS:  Melinda Mack 73 y.o. female presents to establish care for lest breast solitary papillary carcinoma I have reviewed her chart and materials related to her cancer extensively and collaborated history with the patient. 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  No findings suspicious for malignancy in the right breast.   04/28/2024 Mammogram   Unilateral left breast diagnostic mammogram and ultrasound showed   Suspicious LEFT breast mass  measuring 8 mm at the 11 o'clock position requires further characterization with ultrasound-guided biopsy.    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 Pathology showed 1. Breast, left, needle core biopsy, 11 o'clock, 12cmfn, coil clip :       - SOLID PAPILLARY CARCINOMA, SEE NOTE       - SMALL FOCUS SUSPICIOUS FOR INVASION       - LYMPHOVASCULAR INVASION: NOT IDENTIFIED       - CANCER LENGTH: 0.7 CM       - CALCIFICATIONS: NOT IDENTIFIED       - OTHER FINDINGS: NONE   ER 95% positive, PR 100% positive, Ki-67 2%, HER2 - (1+)    Menarche at age of 89 First live birth at age of 51 OCP use: no History of hysterectomy:  Uterus cancer 03/2012, total hysterectmy at [facility]  Menopausal status:  History of HRT use: no  History of chest radiation: no Number of previous breast biopsies:  left side biopsy x 2 -fibrocystic changes, PASH     05/16/2024 Cancer Staging   Staging form: Breast, AJCC 8th Edition - Clinical stage from 05/16/2024: cT1b, cN0, cM0, ER+, PR+, HER2- - Signed by Cap Zelphia, MD on 05/16/2024 Stage prefix: Initial diagnosis   05/26/2024 Surgery   Patient underwent left breast lumpectomy and sentinel lymph node biopsy.  Pathology showed  1. Breast, lumpectomy, left mass :       - INVASIVE SOLID PAPILLARY CARCINOMA, 4.5 MM, GRADE 2 (3+2+1) (SEE SYNOPTIC       REPORT).       - MARGINS NEGATIVE FOR CARCINOMA.       - LYMPHOVASCULAR INVASION NOT IDENTIFIED.       -  BIOPSY SITE, SAVI SCOUT, AND COIL CLIP PRESENT.       - PROGNOSTIC MARKERS ER 100%, PR 100%, HER2 (0)       2. Lymph node, sentinel, biopsy, left 1 and 2 :       - TWO LYMPH NODES, BOTH NEGATIVE FOR CARCINOMA (0/2).      Patient's status post surgery.  She presented to discuss about adjuvant plan.  MEDICAL HISTORY:  Past Medical History:  Diagnosis Date   Anemia    Anxiety    Breast cancer, left (HCC)    CRP elevated     Depression    Elevated alkaline phosphatase level    GERD (gastroesophageal reflux disease)    History of chicken pox    History of endometrial cancer    History of measles    History of methicillin resistant staphylococcus aureus (MRSA)    History of mumps    History of tobacco use    Hypercholesterolemia    Hypothyroidism    Obesity    Osteoarthritis    Pre-diabetes    Recurrent cystitis     SURGICAL HISTORY: Past Surgical History:  Procedure Laterality Date   ABDOMINAL HYSTERECTOMY  04/18/2012   cancer at [facility]   AXILLARY SENTINEL NODE BIOPSY Left 05/26/2024   Procedure: BIOPSY, LYMPH NODE, SENTINEL, AXILLARY;  Surgeon: Rodolph Romano, MD;  Location: [facility] ORS;  Service: General;  Laterality: Left;   BILATERAL SALPINGOOPHORECTOMY  03/2012   BREAST BIOPSY Left 05/08/2016   neg- Byrnett   BREAST BIOPSY Right 10/2015   neg- Byrnett   BREAST BIOPSY Left 04/30/2024   US  LT BREAST BX W LOC DEV 1ST LESION IMG BX SPEC US  GUIDE 04/30/2024 [facility]   BREAST BIOPSY Left 05/19/2024   US  LT BREAST SAVI/RF TAG 1ST LESION US  GUIDE 05/19/2024 [facility]   BREAST LUMPECTOMY WITH RADIO FREQUENCY LOCALIZER Left 05/26/2024   Procedure: BREAST LUMPECTOMY WITH RADIO FREQUENCY LOCALIZER;  Surgeon: Rodolph Romano, MD;  Location: [facility] ORS;  Service: General;  Laterality: Left;   LYMPHADENECTOMY  03/2012   MANDIBLE FRACTURE SURGERY  1984   TMJ   TONSILLECTOMY AND ADENOIDECTOMY  1970   TUBAL LIGATION  1978   US  LT BREAST BX W LOC DEV 1ST LESION IMG BX SPEC US  GUIDE Left    US  BX LEFT BREAST COIL CLIP-PATH PENDING    SOCIAL HISTORY: Social History   Socioeconomic History   Marital status: Widowed    Spouse name: Not on file   Number of children: 3   Years of education: 39   Highest education level: 12th grade  Occupational History    Comment: Film/video editor  Tobacco Use   Smoking status: Former    Current packs/day: 0.00    Average packs/day: 0.5 packs/day for  9.0 years (4.5 ttl pk-yrs)    Types: Cigarettes    Start date: 04/27/1983    Quit date: 04/26/1994    Years since quitting: 30.1   Smokeless tobacco: Never   Tobacco comments:    QUIT IN 1996  Vaping Use   Vaping status: Never Used  Substance and Sexual Activity   Alcohol use: No    Alcohol/week: 0.0 standard drinks of alcohol   Drug use: No   Sexual activity: Not on file  Other Topics Concern   Not on file  Social History Narrative   Widowed   Caffeine use- coffee - 2 cups daily   Social Drivers of Health   Financial Resource Strain: Low Risk  (05/12/2024)  Received from [facility]   Overall Financial Resource Strain (CARDIA)    Difficulty of Paying Living Expenses: Not hard at all  Food Insecurity: No Food Insecurity (05/16/2024)   Hunger Vital Sign    Worried About Running Out of Food in the Last Year: Never true    Ran Out of Food in the Last Year: Never true  Transportation Needs: No Transportation Needs (05/16/2024)   PRAPARE - Administrator, Civil Service (Medical): No    Lack of Transportation (Non-Medical): No  Physical Activity: Insufficiently Active (04/04/2024)   Exercise Vital Sign    Days of Exercise per Week: 2 days    Minutes of Exercise per Session: 20 min  Stress: No Stress Concern Present (04/04/2024)   [employer] of Occupational Health - Occupational Stress Questionnaire    Feeling of Stress: Not at all  Social Connections: Moderately Integrated (04/04/2024)   Social Connection and Isolation Panel    Frequency of Communication with Friends and Family: More than three times a week    Frequency of Social Gatherings with Friends and Family: More than three times a week    Attends Religious Services: More than 4 times per year    Active Member of [employer] or Organizations: Yes    Attends Banker Meetings: 1 to 4 times per year    Marital Status: Widowed  Intimate Partner Violence: Patient Declined (05/16/2024)    Humiliation, Afraid, Rape, and Kick questionnaire    Fear of Current or Ex-Partner: Patient declined    Emotionally Abused: Patient declined    Physically Abused: Patient declined    Sexually Abused: Patient declined    FAMILY HISTORY: Family History  Problem Relation Age of Onset   Lung disease Mother    COPD Mother    Hypertension Mother    Heart disease Mother    Aneurysm Mother    Lung disease Father    Emphysema Father    Healthy Sister    Healthy Sister    Hypertension Maternal Uncle    Lung cancer Maternal Uncle 71   Breast cancer Paternal Aunt 48   Aneurysm Maternal Grandmother    Heart attack Maternal Grandfather    Alzheimer's disease Paternal Grandmother    Coronary artery disease Other     ALLERGIES:  is allergic to codeine, penicillins, tetracycline, povidone-iodine, and misc. sulfonamide containing compounds.  MEDICATIONS:  Current Outpatient Medications  Medication Sig Dispense Refill   DULoxetine (CYMBALTA) 30 MG capsule Take 30 mg by mouth every morning.     levothyroxine  (SYNTHROID ) 50 MCG tablet Take 1 tablet by mouth once daily 90 tablet 0   lovastatin  (MEVACOR ) 40 MG tablet TAKE 1 TABLET BY MOUTH AT BEDTIME 90 tablet 3   Multiple Vitamin (MULTIVITAMIN WITH MINERALS) TABS tablet Take 1 tablet by mouth daily.     No current facility-administered medications for this visit.    Review of Systems  Constitutional:  Negative for appetite change, chills, fatigue and fever.  HENT:   Negative for hearing loss and voice change.   Eyes:  Negative for eye problems.  Respiratory:  Negative for chest tightness and cough.   Cardiovascular:  Negative for chest pain.  Gastrointestinal:  Negative for abdominal distention, abdominal pain and blood in stool.  Endocrine: Negative for hot flashes.  Genitourinary:  Negative for difficulty urinating and frequency.   Musculoskeletal:  Negative for arthralgias.  Skin:  Negative for itching and rash.  Neurological:   Negative for  extremity weakness.  Hematological:  Negative for adenopathy.  Psychiatric/Behavioral:  Negative for confusion.      PHYSICAL EXAMINATION: ECOG PERFORMANCE STATUS: 0 - Asymptomatic  Vitals:   06/11/24 1347  BP: 124/79  Pulse: 66  Resp: 18  Temp: (!) 97.1 F (36.2 C)   Filed Weights   06/11/24 1347  Weight: 159 lb 8 oz (72.3 kg)    Physical Exam Constitutional:      General: She is not in acute distress.    Appearance: She is not diaphoretic.  HENT:     Head: Normocephalic and atraumatic.  Eyes:     General: No scleral icterus. Cardiovascular:     Rate and Rhythm: Normal rate.  Pulmonary:     Effort: Pulmonary effort is normal. No respiratory distress.  Abdominal:     General: There is no distension.  Musculoskeletal:        General: Normal range of motion.     Cervical back: Normal range of motion and neck supple.  Skin:    Findings: No erythema.  Neurological:     Mental Status: She is alert and oriented to person, place, and time. Mental status is at baseline.     Motor: No abnormal muscle tone.  Psychiatric:        Mood and Affect: Affect normal.     LABORATORY DATA:  I have reviewed the data as listed    Latest Ref Rng & Units 10/22/2023    9:32 AM 05/10/2022   10:05 AM 02/23/2021    8:03 AM  CBC  WBC 3.4 - 10.8 x10E3/uL 6.2  6.6  7.1   Hemoglobin 11.1 - 15.9 g/dL 86.3  85.9  86.1   Hematocrit 34.0 - 46.6 % 41.4  43.0  41.9   Platelets 150 - 450 x10E3/uL 273  277  271       Latest Ref Rng & Units 05/16/2024   12:51 PM 04/04/2024    9:13 AM 10/22/2023    9:32 AM  CMP  Glucose 70 - 99 mg/dL  878  878   BUN 8 - 27 mg/dL  20  21   Creatinine 9.42 - 1.00 mg/dL  9.07  8.98   Sodium 865 - 144 mmol/L  143  142   Potassium 3.5 - 5.2 mmol/L  4.3  4.5   Chloride 96 - 106 mmol/L  105  107   CO2 20 - 29 mmol/L  20  22   Calcium 8.7 - 10.3 mg/dL  89.9  9.7   Total Protein 6.5 - 8.1 g/dL 7.9  7.4  6.8   Total Bilirubin 0.0 - 1.2 mg/dL 0.6  0.4   0.5   Alkaline Phos 38 - 126 U/L 102  152    151  137   AST 15 - 41 U/L 24  18  22    ALT 0 - 44 U/L 23  18  29       RADIOGRAPHIC STUDIES: I have personally reviewed the radiological images as listed and agreed with the findings in the report. MM Breast Surgical Specimen Result Date: 05/26/2024 CLINICAL DATA:  Status post [facility] localized LEFT breast lumpectomy. Patient is status post ultrasound-guided biopsy of a LEFT breast which demonstrated papillary carcinoma (COIL clip). Patient underwent SAVI scout localization. EXAM: SPECIMEN RADIOGRAPH OF THE LEFT BREAST COMPARISON:  Previous exam(s). FINDINGS: Status post excision of the Left breast. The [facility] reflector and COIL shaped clip are present within the specimen. IMPRESSION: Specimen radiograph of the  LEFT breast. These results were called by telephone at the time of interpretation on 05/26/2024 at 9:39 am to provider [facility] CINTRON-DIAZ , who verbally acknowledged these results. Electronically Signed   By: Corean Salter M.D.   On: 05/26/2024 09:39   NM Sentinel Node Inj-No Rpt (Breast) Result Date: 05/26/2024 Lymphoseek was injected by the Nuclear Medicine Technologist for sentinel lymph node localization.   US  LT BREAST SAVI/RF TAG 1ST LESION US  GUIDE Result Date: 05/19/2024 CLINICAL DATA:  Patient is status post ultrasound-guided biopsy of a LEFT breast mass at 11 o'clock which demonstrated papillary carcinoma (COIL clip). Patient presents for preoperative localization. EXAM: NEEDLE LOCALIZATION OF THE LEFT BREAST WITH ULTRASOUND GUIDANCE COMPARISON:  Previous exam(s). FINDINGS: Patient presents for needle localization prior to lumpectomy. I met with the patient and we discussed the procedure of needle localization including benefits and alternatives. We discussed the high likelihood of a successful procedure. We discussed the risks of the procedure, including infection, bleeding, tissue injury, and further surgery. Informed,  written consent was given. The usual time-out protocol was performed immediately prior to the procedure. Using ultrasound guidance, sterile technique, 1% lidocaine  and a 7.5 cm SAVI SCOUT needle, the mass at 11 o'clock 12 cm from the nipple was localized using a inferomedial approach. Subsequent two-view mammogram was performed. The images were marked for Dr. Cesar Coe. IMPRESSION: Radar reflector localization of the LEFT breast. No apparent complications. Electronically Signed   By: Corean Salter M.D.   On: 05/19/2024 16:43   MM 3D DIAGNOSTIC MAMMOGRAM UNILATERAL LEFT BREAST Result Date: 05/19/2024 CLINICAL DATA:  Status post ultrasound-guided SAVI scout placement within a papillary carcinoma EXAM: DIAGNOSTIC LEFT MAMMOGRAM POST PLACEMENT OF SAVI SCOUT COMPARISON:  Previous exam(s). ACR Breast Density Category b: There are scattered areas of fibroglandular density. FINDINGS: Mammographic images were obtained following ultrasound guided placement of SAVI scout in the LEFT breast. These demonstrate the SAVI scout within the biopsy proven LEFT breast papillary lesion adjacent to the COIL clip. IMPRESSION: Appropriate position of the SAVI scout within the site of biopsy proven papillary carcinoma. Final Assessment: Post Procedure Mammograms for [place] placement Electronically Signed   By: Corean Salter M.D.   On: 05/19/2024 16:41

## 2024-06-11 NOTE — Progress Notes (Signed)
 Pt here for follow up. Reports some soreness post breast surgery.

## 2024-06-11 NOTE — Assessment & Plan Note (Signed)
 Patient has a history of elevated alkaline phosphatase. Please repeat LFT, patient alkaline phosphatase has normalized.  I will hold off additional testing.

## 2024-06-12 NOTE — Consult Note (Signed)
 NEW PATIENT EVALUATION  Name: Melinda Mack  MRN: [id number]  Date:   06/11/2024     DOB: 1951/03/17   This 73 y.o. 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   PREVIOUS INVESTIGATIONS:  Mammogram and ultrasound reviewed Clinical notes reviewed Pathology reports reviewed  HPI: Patient is a 73 year old female who presented with abnormal mammogram showing possible asymmetry in the left breast.  Diagnostic mammogram confirmed an 8 mm mass 11 o'clock position of the left breast for which ultrasound-guided biopsy was recommended.  Ultrasound of her axillary lymph nodes were normal.  Biopsy was positive for papillary carcinoma with small focus suspicious for invasion.  She went on to have a wide local excisionAnd sentinel node biopsy for a 4.5 mm grade 2 invasive solid papillary carcinoma with clear margins.  Tumor was triple negative.  She has done well postoperatively.  She specifically denies breast tenderness cough or bone pain.  Incision is well-healed.  She is now referred to radiation oncology for opinion.  PLANNED TREATMENT REGIMEN: Left hypofractionated whole breast radiation therapy DIBH  PAST MEDICAL HISTORY:  has a past medical history of Anemia, Anxiety, Breast cancer, left (HCC), CRP elevated, Depression, Elevated alkaline phosphatase level, GERD (gastroesophageal reflux disease), History of chicken pox, History of endometrial cancer, History of measles, History of methicillin resistant staphylococcus aureus (MRSA), History of mumps, History of tobacco use, Hypercholesterolemia,  Hypothyroidism, Obesity, Osteoarthritis, Pre-diabetes, and Recurrent cystitis.    PAST SURGICAL HISTORY:  Past Surgical History:  Procedure Laterality Date   ABDOMINAL HYSTERECTOMY  04/18/2012   cancer at [facility]   AXILLARY SENTINEL NODE BIOPSY Left 05/26/2024   Procedure: BIOPSY, LYMPH NODE, SENTINEL, AXILLARY;  Surgeon: Rodolph Romano, MD;  Location: [facility];  Service: General;  Laterality: Left;   BILATERAL SALPINGOOPHORECTOMY  03/2012   BREAST BIOPSY Left 05/08/2016   neg- Byrnett   BREAST BIOPSY Right 10/2015   neg- Byrnett   BREAST BIOPSY Left 04/30/2024   US  LT BREAST BX W LOC DEV 1ST LESION IMG BX SPEC US  GUIDE 04/30/2024 [facility]   BREAST BIOPSY Left 05/19/2024   US  LT BREAST SAVI/RF TAG 1ST LESION US  GUIDE 05/19/2024 [facility]   BREAST LUMPECTOMY WITH RADIO FREQUENCY LOCALIZER Left 05/26/2024   Procedure: BREAST LUMPECTOMY WITH RADIO FREQUENCY LOCALIZER;  Surgeon: Rodolph Romano, MD;  Location: [facility] OR[facility];  Service: General;  Laterality: Left;   LYMPHADENECTOMY  03/2012   MANDIBLE FRACTURE SURGERY  1984   TMJ   TONSILLECTOMY AND ADENOIDECTOMY  1970   TUBAL LIGATION  1978   US  LT BREAST BX W LOC DEV 1ST LESION IMG BX SPEC US  GUIDE Left    US  BX LEFT BREAST COIL CLIP-PATH PENDING    FAMILY HISTORY: family history includes Alzheimer's disease in her paternal grandmother; Aneurysm in her maternal grandmother and mother; Breast cancer (age of onset: 88) in her paternal aunt; COPD in her mother; Coronary artery disease in an other family member; Emphysema in her father; Healthy in her sister and sister; Heart attack in her maternal grandfather; Heart disease in her mother; Hypertension in her maternal uncle and mother; Lung cancer (age of onset: 43)  in her maternal uncle; Lung disease in her father and mother.  SOCIAL HISTORY:  reports that she quit smoking about 30 years ago. Her smoking use included cigarettes. She started smoking about 41 years ago. She has a 4.5  pack-year smoking history. She has never used smokeless tobacco. She reports that she does not drink alcohol and does not use drugs.  ALLERGIES: Codeine, Penicillins, Tetracycline, Povidone-iodine, and Misc. sulfonamide containing compounds  MEDICATIONS:  Current Outpatient Medications  Medication Sig Dispense Refill   DULoxetine (CYMBALTA) 30 MG capsule Take 30 mg by mouth every morning.     levothyroxine  (SYNTHROID ) 50 MCG tablet Take 1 tablet by mouth once daily 90 tablet 0   lovastatin  (MEVACOR ) 40 MG tablet TAKE 1 TABLET BY MOUTH AT BEDTIME 90 tablet 3   Multiple Vitamin (MULTIVITAMIN WITH MINERALS) TABS tablet Take 1 tablet by mouth daily.     No current facility-administered medications for this encounter.    ECOG PERFORMANCE STATUS:  0 - Asymptomatic  REVIEW OF SYSTEMS: Patient denies any weight loss, fatigue, weakness, fever, chills or night sweats. Patient denies any loss of vision, blurred vision. Patient denies any ringing  of the ears or hearing loss. No irregular heartbeat. Patient denies heart murmur or history of fainting. Patient denies any chest pain or pain radiating to her upper extremities. Patient denies any shortness of breath, difficulty breathing at night, cough or hemoptysis. Patient denies any swelling in the lower legs. Patient denies any nausea vomiting, vomiting of blood, or coffee ground material in the vomitus. Patient denies any stomach pain. Patient states has had normal bowel movements no significant constipation or diarrhea. Patient denies any dysuria, hematuria or significant nocturia. Patient denies any problems walking, swelling in the joints or loss of balance. Patient denies any skin changes, loss of hair or loss of weight. Patient denies any excessive worrying or anxiety or significant depression. Patient denies any problems with insomnia. Patient denies excessive thirst, polyuria, polydipsia. Patient denies any swollen glands, patient denies easy bruising  or easy bleeding. Patient denies any recent infections, allergies or URI. Patient s visual fields have not changed significantly in recent time.   PHYSICAL EXAM: There were no vitals taken for this visit. Patient is wide local excision and sentinel node scar both healed well.  Some tenderness and some slight edema around her sentinel node scar.  No dominant masses noted in either breast no axillary or supraclavicular adenopathy is appreciated.  Well-developed well-nourished patient in NAD. HEENT reveals PERLA, EOMI, discs not visualized.  Oral cavity is clear. No oral mucosal lesions are identified. Neck is clear without evidence of cervical or supraclavicular adenopathy. Lungs are clear to A&P. Cardiac examination is essentially unremarkable with regular rate and rhythm without murmur rub or thrill. Abdomen is benign with no organomegaly or masses noted. Motor sensory and DTR levels are equal and symmetric in the upper and lower extremities. Cranial nerves II through XII are grossly intact. Proprioception is intact. No peripheral adenopathy or edema is identified. No motor or sensory levels are noted. Crude visual fields are within normal range.  LABORATORY DATA: Pathology reports reviewed    RADIOLOGY RESULTS: Mammograms ultrasound reviewed compatible with above-stated findings   IMPRESSION: Stage Ia invasive papillary carcinoma the left breast status post wide local excision and sentinel biopsy triple negative in 73 year old female  PLAN: At this time I will plan 3-week course of hypofractionated whole breast radiation attempting to do DIBH.  Would also boost her scar another 1000 cGy  using photon beam.  Risks and benefits of treatment including skin reaction fatigue alteration of blood counts possible occlusion of superficial lung all were reviewed in detail with the patient.  She comprehends my treatment plan well.  I have personally set up and ordered CT simulation for next week.  I would like  to take this opportunity to thank you for allowing me to participate in the care of your patient.[name] Marcey Penton, MD

## 2024-06-16 DIAGNOSIS — Z171 Estrogen receptor negative status [ER-]: Secondary | ICD-10-CM | POA: Diagnosis not present

## 2024-06-16 DIAGNOSIS — C50212 Malignant neoplasm of upper-inner quadrant of left female breast: Secondary | ICD-10-CM | POA: Diagnosis not present

## 2024-06-24 ENCOUNTER — Ambulatory Visit
Admission: RE | Admit: 2024-06-24 | Discharge: 2024-06-24 | Disposition: A | Discharge status: Home / Self Care | Source: Ambulatory Visit | Attending: Radiation Oncology | Admitting: Radiation Oncology

## 2024-06-24 ENCOUNTER — Encounter: Payer: Self-pay | Admitting: *Deleted

## 2024-06-24 DIAGNOSIS — Z171 Estrogen receptor negative status [ER-]: Secondary | ICD-10-CM | POA: Diagnosis not present

## 2024-06-24 DIAGNOSIS — Z1732 Human epidermal growth factor receptor 2 negative status: Secondary | ICD-10-CM | POA: Diagnosis not present

## 2024-06-24 DIAGNOSIS — Z170 Estrogen receptor positive status [ER+]: Secondary | ICD-10-CM | POA: Diagnosis not present

## 2024-06-24 DIAGNOSIS — C50212 Malignant neoplasm of upper-inner quadrant of left female breast: Secondary | ICD-10-CM | POA: Diagnosis not present

## 2024-06-24 DIAGNOSIS — Z510 Encounter for antineoplastic radiation therapy: Secondary | ICD-10-CM | POA: Insufficient documentation

## 2024-06-24 DIAGNOSIS — Z1721 Progesterone receptor positive status: Secondary | ICD-10-CM | POA: Diagnosis not present

## 2024-06-25 ENCOUNTER — Other Ambulatory Visit: Payer: Self-pay | Admitting: *Deleted

## 2024-06-25 DIAGNOSIS — C50919 Malignant neoplasm of unspecified site of unspecified female breast: Secondary | ICD-10-CM

## 2024-06-27 DIAGNOSIS — C50212 Malignant neoplasm of upper-inner quadrant of left female breast: Secondary | ICD-10-CM | POA: Diagnosis not present

## 2024-06-27 DIAGNOSIS — Z510 Encounter for antineoplastic radiation therapy: Secondary | ICD-10-CM | POA: Diagnosis not present

## 2024-06-27 DIAGNOSIS — Z171 Estrogen receptor negative status [ER-]: Secondary | ICD-10-CM | POA: Diagnosis not present

## 2024-07-01 ENCOUNTER — Other Ambulatory Visit: Payer: Self-pay | Admitting: *Deleted

## 2024-07-01 ENCOUNTER — Ambulatory Visit
Admission: RE | Admit: 2024-07-01 | Discharge: 2024-07-01 | Disposition: A | Discharge status: Home / Self Care | Source: Ambulatory Visit | Attending: Radiation Oncology | Admitting: Radiation Oncology

## 2024-07-01 DIAGNOSIS — Z171 Estrogen receptor negative status [ER-]: Secondary | ICD-10-CM | POA: Diagnosis not present

## 2024-07-01 DIAGNOSIS — Z510 Encounter for antineoplastic radiation therapy: Secondary | ICD-10-CM | POA: Diagnosis not present

## 2024-07-01 DIAGNOSIS — C50212 Malignant neoplasm of upper-inner quadrant of left female breast: Secondary | ICD-10-CM | POA: Diagnosis not present

## 2024-07-01 DIAGNOSIS — C50919 Malignant neoplasm of unspecified site of unspecified female breast: Secondary | ICD-10-CM

## 2024-07-02 ENCOUNTER — Other Ambulatory Visit: Payer: Self-pay

## 2024-07-02 ENCOUNTER — Ambulatory Visit
Admission: RE | Admit: 2024-07-02 | Discharge: 2024-07-02 | Disposition: A | Discharge status: Home / Self Care | Source: Ambulatory Visit | Attending: Radiation Oncology | Admitting: Radiation Oncology

## 2024-07-02 DIAGNOSIS — Z171 Estrogen receptor negative status [ER-]: Secondary | ICD-10-CM | POA: Diagnosis not present

## 2024-07-02 DIAGNOSIS — C50212 Malignant neoplasm of upper-inner quadrant of left female breast: Secondary | ICD-10-CM | POA: Diagnosis not present

## 2024-07-02 DIAGNOSIS — Z510 Encounter for antineoplastic radiation therapy: Secondary | ICD-10-CM | POA: Diagnosis not present

## 2024-07-02 LAB — RAD ONC ARIA SESSION SUMMARY
Course Elapsed Days: 0
Plan Fractions Treated to Date: 1
Plan Prescribed Dose Per Fraction: 2.66 Gy
Plan Total Fractions Prescribed: 16
Plan Total Prescribed Dose: 42.56 Gy
Reference Point Dosage Given to Date: 2.66 Gy
Reference Point Session Dosage Given: 2.66 Gy
Session Number: 1

## 2024-07-03 ENCOUNTER — Ambulatory Visit
Admission: RE | Admit: 2024-07-03 | Discharge: 2024-07-03 | Disposition: A | Discharge status: Home / Self Care | Source: Ambulatory Visit | Attending: Radiation Oncology | Admitting: Radiation Oncology

## 2024-07-03 ENCOUNTER — Other Ambulatory Visit: Payer: Self-pay

## 2024-07-03 DIAGNOSIS — C50212 Malignant neoplasm of upper-inner quadrant of left female breast: Secondary | ICD-10-CM | POA: Diagnosis not present

## 2024-07-03 DIAGNOSIS — Z510 Encounter for antineoplastic radiation therapy: Secondary | ICD-10-CM | POA: Diagnosis not present

## 2024-07-03 DIAGNOSIS — Z171 Estrogen receptor negative status [ER-]: Secondary | ICD-10-CM | POA: Diagnosis not present

## 2024-07-03 LAB — RAD ONC ARIA SESSION SUMMARY
Course Elapsed Days: 1
Plan Fractions Treated to Date: 2
Plan Prescribed Dose Per Fraction: 2.66 Gy
Plan Total Fractions Prescribed: 16
Plan Total Prescribed Dose: 42.56 Gy
Reference Point Dosage Given to Date: 5.32 Gy
Reference Point Session Dosage Given: 2.66 Gy
Session Number: 2

## 2024-07-04 ENCOUNTER — Ambulatory Visit
Admission: RE | Admit: 2024-07-04 | Discharge: 2024-07-04 | Disposition: A | Discharge status: Home / Self Care | Source: Ambulatory Visit | Attending: Radiation Oncology | Admitting: Radiation Oncology

## 2024-07-04 ENCOUNTER — Other Ambulatory Visit: Payer: Self-pay

## 2024-07-04 DIAGNOSIS — Z510 Encounter for antineoplastic radiation therapy: Secondary | ICD-10-CM | POA: Diagnosis not present

## 2024-07-04 DIAGNOSIS — Z171 Estrogen receptor negative status [ER-]: Secondary | ICD-10-CM | POA: Diagnosis not present

## 2024-07-04 DIAGNOSIS — C50212 Malignant neoplasm of upper-inner quadrant of left female breast: Secondary | ICD-10-CM | POA: Diagnosis not present

## 2024-07-04 LAB — RAD ONC ARIA SESSION SUMMARY
Course Elapsed Days: 2
Plan Fractions Treated to Date: 3
Plan Prescribed Dose Per Fraction: 2.66 Gy
Plan Total Fractions Prescribed: 16
Plan Total Prescribed Dose: 42.56 Gy
Reference Point Dosage Given to Date: 7.98 Gy
Reference Point Session Dosage Given: 2.66 Gy
Session Number: 3

## 2024-07-07 ENCOUNTER — Inpatient Hospital Stay

## 2024-07-07 ENCOUNTER — Ambulatory Visit
Admission: RE | Admit: 2024-07-07 | Discharge: 2024-07-07 | Disposition: A | Discharge status: Home / Self Care | Source: Ambulatory Visit | Attending: Radiation Oncology | Admitting: Radiation Oncology

## 2024-07-07 ENCOUNTER — Other Ambulatory Visit: Payer: Self-pay

## 2024-07-07 DIAGNOSIS — C50212 Malignant neoplasm of upper-inner quadrant of left female breast: Secondary | ICD-10-CM | POA: Insufficient documentation

## 2024-07-07 DIAGNOSIS — Z1721 Progesterone receptor positive status: Secondary | ICD-10-CM | POA: Insufficient documentation

## 2024-07-07 DIAGNOSIS — Z510 Encounter for antineoplastic radiation therapy: Secondary | ICD-10-CM | POA: Diagnosis not present

## 2024-07-07 DIAGNOSIS — Z170 Estrogen receptor positive status [ER+]: Secondary | ICD-10-CM | POA: Insufficient documentation

## 2024-07-07 DIAGNOSIS — Z1732 Human epidermal growth factor receptor 2 negative status: Secondary | ICD-10-CM | POA: Insufficient documentation

## 2024-07-07 DIAGNOSIS — Z171 Estrogen receptor negative status [ER-]: Secondary | ICD-10-CM | POA: Diagnosis not present

## 2024-07-07 DIAGNOSIS — C50919 Malignant neoplasm of unspecified site of unspecified female breast: Secondary | ICD-10-CM

## 2024-07-07 LAB — CBC (CANCER CENTER ONLY)
HCT: 42.1 % (ref 36.0–46.0)
Hemoglobin: 13.4 g/dL (ref 12.0–15.0)
MCH: 28 pg (ref 26.0–34.0)
MCHC: 31.8 g/dL (ref 30.0–36.0)
MCV: 88.1 fL (ref 80.0–100.0)
Platelet Count: 258 K/uL (ref 150–400)
RBC: 4.78 MIL/uL (ref 3.87–5.11)
RDW: 15 % (ref 11.5–15.5)
WBC Count: 6.9 K/uL (ref 4.0–10.5)
nRBC: 0 % (ref 0.0–0.2)

## 2024-07-07 LAB — RAD ONC ARIA SESSION SUMMARY
Course Elapsed Days: 5
Plan Fractions Treated to Date: 4
Plan Prescribed Dose Per Fraction: 2.66 Gy
Plan Total Fractions Prescribed: 16
Plan Total Prescribed Dose: 42.56 Gy
Reference Point Dosage Given to Date: 10.64 Gy
Reference Point Session Dosage Given: 2.66 Gy
Session Number: 4

## 2024-07-08 ENCOUNTER — Other Ambulatory Visit: Payer: Self-pay

## 2024-07-08 ENCOUNTER — Ambulatory Visit
Admission: RE | Admit: 2024-07-08 | Discharge: 2024-07-08 | Disposition: A | Discharge status: Home / Self Care | Source: Ambulatory Visit | Attending: Radiation Oncology | Admitting: Radiation Oncology

## 2024-07-08 DIAGNOSIS — Z510 Encounter for antineoplastic radiation therapy: Secondary | ICD-10-CM | POA: Diagnosis not present

## 2024-07-08 DIAGNOSIS — C50212 Malignant neoplasm of upper-inner quadrant of left female breast: Secondary | ICD-10-CM | POA: Diagnosis not present

## 2024-07-08 DIAGNOSIS — Z171 Estrogen receptor negative status [ER-]: Secondary | ICD-10-CM | POA: Diagnosis not present

## 2024-07-08 LAB — RAD ONC ARIA SESSION SUMMARY
Course Elapsed Days: 6
Plan Fractions Treated to Date: 5
Plan Prescribed Dose Per Fraction: 2.66 Gy
Plan Total Fractions Prescribed: 16
Plan Total Prescribed Dose: 42.56 Gy
Reference Point Dosage Given to Date: 13.3 Gy
Reference Point Session Dosage Given: 2.66 Gy
Session Number: 5

## 2024-07-09 ENCOUNTER — Other Ambulatory Visit: Payer: Self-pay

## 2024-07-09 ENCOUNTER — Other Ambulatory Visit: Payer: Self-pay | Admitting: Family Medicine

## 2024-07-09 ENCOUNTER — Ambulatory Visit
Admission: RE | Admit: 2024-07-09 | Discharge: 2024-07-09 | Disposition: A | Discharge status: Home / Self Care | Source: Ambulatory Visit | Attending: Radiation Oncology | Admitting: Radiation Oncology

## 2024-07-09 DIAGNOSIS — C50212 Malignant neoplasm of upper-inner quadrant of left female breast: Secondary | ICD-10-CM | POA: Diagnosis not present

## 2024-07-09 DIAGNOSIS — Z510 Encounter for antineoplastic radiation therapy: Secondary | ICD-10-CM | POA: Diagnosis not present

## 2024-07-09 DIAGNOSIS — Z171 Estrogen receptor negative status [ER-]: Secondary | ICD-10-CM | POA: Diagnosis not present

## 2024-07-09 LAB — RAD ONC ARIA SESSION SUMMARY
Course Elapsed Days: 7
Plan Fractions Treated to Date: 6
Plan Prescribed Dose Per Fraction: 2.66 Gy
Plan Total Fractions Prescribed: 16
Plan Total Prescribed Dose: 42.56 Gy
Reference Point Dosage Given to Date: 15.96 Gy
Reference Point Session Dosage Given: 2.66 Gy
Session Number: 6

## 2024-07-10 ENCOUNTER — Other Ambulatory Visit: Payer: Self-pay

## 2024-07-10 ENCOUNTER — Ambulatory Visit
Admission: RE | Admit: 2024-07-10 | Discharge: 2024-07-10 | Discharge status: Home / Self Care | Source: Ambulatory Visit | Attending: Radiation Oncology | Admitting: Radiation Oncology

## 2024-07-10 DIAGNOSIS — Z171 Estrogen receptor negative status [ER-]: Secondary | ICD-10-CM | POA: Diagnosis not present

## 2024-07-10 DIAGNOSIS — C50212 Malignant neoplasm of upper-inner quadrant of left female breast: Secondary | ICD-10-CM | POA: Diagnosis not present

## 2024-07-10 DIAGNOSIS — Z510 Encounter for antineoplastic radiation therapy: Secondary | ICD-10-CM | POA: Diagnosis not present

## 2024-07-10 LAB — RAD ONC ARIA SESSION SUMMARY
Course Elapsed Days: 8
Plan Fractions Treated to Date: 7
Plan Prescribed Dose Per Fraction: 2.66 Gy
Plan Total Fractions Prescribed: 16
Plan Total Prescribed Dose: 42.56 Gy
Reference Point Dosage Given to Date: 18.62 Gy
Reference Point Session Dosage Given: 2.66 Gy
Session Number: 7

## 2024-07-11 ENCOUNTER — Other Ambulatory Visit: Payer: Self-pay

## 2024-07-11 ENCOUNTER — Ambulatory Visit
Admission: RE | Admit: 2024-07-11 | Discharge: 2024-07-11 | Disposition: A | Discharge status: Home / Self Care | Source: Ambulatory Visit | Attending: Radiation Oncology | Admitting: Radiation Oncology

## 2024-07-11 DIAGNOSIS — Z171 Estrogen receptor negative status [ER-]: Secondary | ICD-10-CM | POA: Diagnosis not present

## 2024-07-11 DIAGNOSIS — C50212 Malignant neoplasm of upper-inner quadrant of left female breast: Secondary | ICD-10-CM | POA: Diagnosis not present

## 2024-07-11 DIAGNOSIS — Z510 Encounter for antineoplastic radiation therapy: Secondary | ICD-10-CM | POA: Diagnosis not present

## 2024-07-11 LAB — RAD ONC ARIA SESSION SUMMARY
Course Elapsed Days: 9
Plan Fractions Treated to Date: 8
Plan Prescribed Dose Per Fraction: 2.66 Gy
Plan Total Fractions Prescribed: 16
Plan Total Prescribed Dose: 42.56 Gy
Reference Point Dosage Given to Date: 21.28 Gy
Reference Point Session Dosage Given: 2.66 Gy
Session Number: 8

## 2024-07-14 ENCOUNTER — Telehealth: Payer: Self-pay | Admitting: Family Medicine

## 2024-07-14 ENCOUNTER — Other Ambulatory Visit: Payer: Self-pay

## 2024-07-14 ENCOUNTER — Ambulatory Visit
Admission: RE | Admit: 2024-07-14 | Discharge: 2024-07-14 | Disposition: A | Discharge status: Home / Self Care | Source: Ambulatory Visit | Attending: Radiation Oncology | Admitting: Radiation Oncology

## 2024-07-14 DIAGNOSIS — Z510 Encounter for antineoplastic radiation therapy: Secondary | ICD-10-CM | POA: Diagnosis not present

## 2024-07-14 DIAGNOSIS — Z171 Estrogen receptor negative status [ER-]: Secondary | ICD-10-CM | POA: Diagnosis not present

## 2024-07-14 DIAGNOSIS — C50212 Malignant neoplasm of upper-inner quadrant of left female breast: Secondary | ICD-10-CM | POA: Diagnosis not present

## 2024-07-14 LAB — RAD ONC ARIA SESSION SUMMARY
Course Elapsed Days: 12
Plan Fractions Treated to Date: 9
Plan Prescribed Dose Per Fraction: 2.66 Gy
Plan Total Fractions Prescribed: 16
Plan Total Prescribed Dose: 42.56 Gy
Reference Point Dosage Given to Date: 23.94 Gy
Reference Point Session Dosage Given: 2.66 Gy
Session Number: 9

## 2024-07-14 NOTE — Telephone Encounter (Signed)
 Fax received from [facility] on [address] [place], [place] [address] Phone: [phone] Fax: [phone] Need for Clarification Levothyroxin TAB QTY:90  Notes to prescriber: Can we change Manufacturer? Mylan Discontinued

## 2024-07-15 ENCOUNTER — Ambulatory Visit
Admission: RE | Admit: 2024-07-15 | Discharge: 2024-07-15 | Disposition: A | Discharge status: Home / Self Care | Source: Ambulatory Visit | Attending: Radiation Oncology | Admitting: Radiation Oncology

## 2024-07-15 ENCOUNTER — Other Ambulatory Visit: Payer: Self-pay

## 2024-07-15 DIAGNOSIS — Z171 Estrogen receptor negative status [ER-]: Secondary | ICD-10-CM | POA: Diagnosis not present

## 2024-07-15 DIAGNOSIS — Z510 Encounter for antineoplastic radiation therapy: Secondary | ICD-10-CM | POA: Diagnosis not present

## 2024-07-15 DIAGNOSIS — C50212 Malignant neoplasm of upper-inner quadrant of left female breast: Secondary | ICD-10-CM | POA: Diagnosis not present

## 2024-07-15 LAB — RAD ONC ARIA SESSION SUMMARY
Course Elapsed Days: 13
Plan Fractions Treated to Date: 10
Plan Prescribed Dose Per Fraction: 2.66 Gy
Plan Total Fractions Prescribed: 16
Plan Total Prescribed Dose: 42.56 Gy
Reference Point Dosage Given to Date: 26.6 Gy
Reference Point Session Dosage Given: 2.66 Gy
Session Number: 10

## 2024-07-15 NOTE — Telephone Encounter (Signed)
  Notes to prescriber: Can we change Manufacturer? Mylan Discontinued

## 2024-07-15 NOTE — Telephone Encounter (Signed)
 Ok to change manufacturers

## 2024-07-16 ENCOUNTER — Other Ambulatory Visit: Payer: Self-pay

## 2024-07-16 ENCOUNTER — Ambulatory Visit
Admission: RE | Admit: 2024-07-16 | Discharge: 2024-07-16 | Disposition: A | Discharge status: Home / Self Care | Source: Ambulatory Visit | Attending: Radiation Oncology | Admitting: Radiation Oncology

## 2024-07-16 ENCOUNTER — Ambulatory Visit
Admission: RE | Admit: 2024-07-16 | Discharge: 2024-07-16 | Discharge status: Home / Self Care | Source: Ambulatory Visit | Attending: Radiation Oncology | Admitting: Radiation Oncology

## 2024-07-16 DIAGNOSIS — Z171 Estrogen receptor negative status [ER-]: Secondary | ICD-10-CM | POA: Diagnosis not present

## 2024-07-16 DIAGNOSIS — C50212 Malignant neoplasm of upper-inner quadrant of left female breast: Secondary | ICD-10-CM | POA: Diagnosis not present

## 2024-07-16 DIAGNOSIS — Z510 Encounter for antineoplastic radiation therapy: Secondary | ICD-10-CM | POA: Diagnosis not present

## 2024-07-16 LAB — RAD ONC ARIA SESSION SUMMARY
Course Elapsed Days: 14
Plan Fractions Treated to Date: 11
Plan Prescribed Dose Per Fraction: 2.66 Gy
Plan Total Fractions Prescribed: 16
Plan Total Prescribed Dose: 42.56 Gy
Reference Point Dosage Given to Date: 29.26 Gy
Reference Point Session Dosage Given: 2.66 Gy
Session Number: 11

## 2024-07-16 NOTE — Telephone Encounter (Signed)
 Spoke with pharmacy, verbal consent per provider ok to change manufacturer. Pharmacist verbalized the ok and will change it.

## 2024-07-17 ENCOUNTER — Other Ambulatory Visit: Payer: Self-pay

## 2024-07-17 ENCOUNTER — Ambulatory Visit
Admission: RE | Admit: 2024-07-17 | Discharge: 2024-07-17 | Disposition: A | Discharge status: Home / Self Care | Source: Ambulatory Visit | Attending: Radiation Oncology | Admitting: Radiation Oncology

## 2024-07-17 DIAGNOSIS — C50212 Malignant neoplasm of upper-inner quadrant of left female breast: Secondary | ICD-10-CM | POA: Diagnosis not present

## 2024-07-17 DIAGNOSIS — Z171 Estrogen receptor negative status [ER-]: Secondary | ICD-10-CM | POA: Diagnosis not present

## 2024-07-17 DIAGNOSIS — Z510 Encounter for antineoplastic radiation therapy: Secondary | ICD-10-CM | POA: Diagnosis not present

## 2024-07-17 LAB — RAD ONC ARIA SESSION SUMMARY
Course Elapsed Days: 15
Plan Fractions Treated to Date: 12
Plan Prescribed Dose Per Fraction: 2.66 Gy
Plan Total Fractions Prescribed: 16
Plan Total Prescribed Dose: 42.56 Gy
Reference Point Dosage Given to Date: 31.92 Gy
Reference Point Session Dosage Given: 2.66 Gy
Session Number: 12

## 2024-07-18 ENCOUNTER — Other Ambulatory Visit: Payer: Self-pay

## 2024-07-18 ENCOUNTER — Ambulatory Visit
Admission: RE | Admit: 2024-07-18 | Discharge: 2024-07-18 | Disposition: A | Discharge status: Home / Self Care | Source: Ambulatory Visit | Attending: Radiation Oncology | Admitting: Radiation Oncology

## 2024-07-18 DIAGNOSIS — C50212 Malignant neoplasm of upper-inner quadrant of left female breast: Secondary | ICD-10-CM | POA: Diagnosis not present

## 2024-07-18 DIAGNOSIS — Z510 Encounter for antineoplastic radiation therapy: Secondary | ICD-10-CM | POA: Diagnosis not present

## 2024-07-18 DIAGNOSIS — Z171 Estrogen receptor negative status [ER-]: Secondary | ICD-10-CM | POA: Diagnosis not present

## 2024-07-18 LAB — RAD ONC ARIA SESSION SUMMARY
Course Elapsed Days: 16
Plan Fractions Treated to Date: 13
Plan Prescribed Dose Per Fraction: 2.66 Gy
Plan Total Fractions Prescribed: 16
Plan Total Prescribed Dose: 42.56 Gy
Reference Point Dosage Given to Date: 34.58 Gy
Reference Point Session Dosage Given: 2.66 Gy
Session Number: 13

## 2024-07-21 ENCOUNTER — Inpatient Hospital Stay

## 2024-07-21 ENCOUNTER — Other Ambulatory Visit: Payer: Self-pay

## 2024-07-21 ENCOUNTER — Ambulatory Visit
Admission: RE | Admit: 2024-07-21 | Discharge: 2024-07-21 | Disposition: A | Source: Ambulatory Visit | Attending: Radiation Oncology | Admitting: Radiation Oncology

## 2024-07-21 DIAGNOSIS — Z171 Estrogen receptor negative status [ER-]: Secondary | ICD-10-CM | POA: Diagnosis not present

## 2024-07-21 DIAGNOSIS — Z510 Encounter for antineoplastic radiation therapy: Secondary | ICD-10-CM | POA: Diagnosis not present

## 2024-07-21 DIAGNOSIS — C50919 Malignant neoplasm of unspecified site of unspecified female breast: Secondary | ICD-10-CM

## 2024-07-21 DIAGNOSIS — C50212 Malignant neoplasm of upper-inner quadrant of left female breast: Secondary | ICD-10-CM | POA: Diagnosis not present

## 2024-07-21 LAB — RAD ONC ARIA SESSION SUMMARY
Course Elapsed Days: 19
Plan Fractions Treated to Date: 14
Plan Prescribed Dose Per Fraction: 2.66 Gy
Plan Total Fractions Prescribed: 16
Plan Total Prescribed Dose: 42.56 Gy
Reference Point Dosage Given to Date: 37.24 Gy
Reference Point Session Dosage Given: 2.66 Gy
Session Number: 14

## 2024-07-21 LAB — CBC (CANCER CENTER ONLY)
HCT: 40.8 % (ref 36.0–46.0)
Hemoglobin: 13.4 g/dL (ref 12.0–15.0)
MCH: 28.6 pg (ref 26.0–34.0)
MCHC: 32.8 g/dL (ref 30.0–36.0)
MCV: 87.2 fL (ref 80.0–100.0)
Platelet Count: 252 K/uL (ref 150–400)
RBC: 4.68 MIL/uL (ref 3.87–5.11)
RDW: 14.7 % (ref 11.5–15.5)
WBC Count: 7.1 K/uL (ref 4.0–10.5)
nRBC: 0 % (ref 0.0–0.2)

## 2024-07-22 ENCOUNTER — Ambulatory Visit
Admission: RE | Admit: 2024-07-22 | Discharge: 2024-07-22 | Disposition: A | Source: Ambulatory Visit | Attending: Radiation Oncology | Admitting: Radiation Oncology

## 2024-07-22 ENCOUNTER — Other Ambulatory Visit: Payer: Self-pay

## 2024-07-22 DIAGNOSIS — Z510 Encounter for antineoplastic radiation therapy: Secondary | ICD-10-CM | POA: Diagnosis not present

## 2024-07-22 DIAGNOSIS — Z171 Estrogen receptor negative status [ER-]: Secondary | ICD-10-CM | POA: Diagnosis not present

## 2024-07-22 DIAGNOSIS — C50212 Malignant neoplasm of upper-inner quadrant of left female breast: Secondary | ICD-10-CM | POA: Diagnosis not present

## 2024-07-22 LAB — RAD ONC ARIA SESSION SUMMARY
Course Elapsed Days: 20
Plan Fractions Treated to Date: 15
Plan Prescribed Dose Per Fraction: 2.66 Gy
Plan Total Fractions Prescribed: 16
Plan Total Prescribed Dose: 42.56 Gy
Reference Point Dosage Given to Date: 39.9 Gy
Reference Point Session Dosage Given: 2.66 Gy
Session Number: 15

## 2024-07-23 ENCOUNTER — Ambulatory Visit
Admission: RE | Admit: 2024-07-23 | Discharge: 2024-07-23 | Disposition: A | Discharge status: Home / Self Care | Source: Ambulatory Visit | Attending: Radiation Oncology | Admitting: Radiation Oncology

## 2024-07-23 ENCOUNTER — Other Ambulatory Visit: Payer: Self-pay

## 2024-07-23 DIAGNOSIS — Z171 Estrogen receptor negative status [ER-]: Secondary | ICD-10-CM | POA: Diagnosis not present

## 2024-07-23 DIAGNOSIS — C50212 Malignant neoplasm of upper-inner quadrant of left female breast: Secondary | ICD-10-CM | POA: Diagnosis not present

## 2024-07-23 DIAGNOSIS — Z510 Encounter for antineoplastic radiation therapy: Secondary | ICD-10-CM | POA: Insufficient documentation

## 2024-07-23 DIAGNOSIS — Z170 Estrogen receptor positive status [ER+]: Secondary | ICD-10-CM | POA: Diagnosis not present

## 2024-07-23 DIAGNOSIS — Z1732 Human epidermal growth factor receptor 2 negative status: Secondary | ICD-10-CM | POA: Diagnosis not present

## 2024-07-23 DIAGNOSIS — Z1721 Progesterone receptor positive status: Secondary | ICD-10-CM | POA: Insufficient documentation

## 2024-07-23 LAB — RAD ONC ARIA SESSION SUMMARY
Course Elapsed Days: 21
Plan Fractions Treated to Date: 16
Plan Prescribed Dose Per Fraction: 2.66 Gy
Plan Total Fractions Prescribed: 16
Plan Total Prescribed Dose: 42.56 Gy
Reference Point Dosage Given to Date: 42.56 Gy
Reference Point Session Dosage Given: 2.66 Gy
Session Number: 16

## 2024-07-24 ENCOUNTER — Ambulatory Visit
Admission: RE | Admit: 2024-07-24 | Discharge: 2024-07-24 | Disposition: A | Discharge status: Home / Self Care | Source: Ambulatory Visit | Attending: Radiation Oncology | Admitting: Radiation Oncology

## 2024-07-24 ENCOUNTER — Other Ambulatory Visit: Payer: Self-pay

## 2024-07-24 DIAGNOSIS — Z510 Encounter for antineoplastic radiation therapy: Secondary | ICD-10-CM | POA: Diagnosis not present

## 2024-07-24 LAB — RAD ONC ARIA SESSION SUMMARY
Course Elapsed Days: 22
Plan Fractions Treated to Date: 1
Plan Prescribed Dose Per Fraction: 2 Gy
Plan Total Fractions Prescribed: 5
Plan Total Prescribed Dose: 10 Gy
Reference Point Dosage Given to Date: 2 Gy
Reference Point Session Dosage Given: 2 Gy
Session Number: 17

## 2024-07-25 ENCOUNTER — Other Ambulatory Visit: Payer: Self-pay

## 2024-07-25 ENCOUNTER — Ambulatory Visit
Admission: RE | Admit: 2024-07-25 | Discharge: 2024-07-25 | Disposition: A | Discharge status: Home / Self Care | Source: Ambulatory Visit | Attending: Radiation Oncology | Admitting: Radiation Oncology

## 2024-07-25 DIAGNOSIS — Z510 Encounter for antineoplastic radiation therapy: Secondary | ICD-10-CM | POA: Diagnosis not present

## 2024-07-25 LAB — RAD ONC ARIA SESSION SUMMARY
Course Elapsed Days: 23
Plan Fractions Treated to Date: 2
Plan Prescribed Dose Per Fraction: 2 Gy
Plan Total Fractions Prescribed: 5
Plan Total Prescribed Dose: 10 Gy
Reference Point Dosage Given to Date: 4 Gy
Reference Point Session Dosage Given: 2 Gy
Session Number: 18

## 2024-07-28 ENCOUNTER — Other Ambulatory Visit: Payer: Self-pay

## 2024-07-28 ENCOUNTER — Ambulatory Visit
Admission: RE | Admit: 2024-07-28 | Discharge: 2024-07-28 | Disposition: A | Source: Ambulatory Visit | Attending: Radiation Oncology | Admitting: Radiation Oncology

## 2024-07-28 DIAGNOSIS — Z510 Encounter for antineoplastic radiation therapy: Secondary | ICD-10-CM | POA: Diagnosis not present

## 2024-07-28 LAB — RAD ONC ARIA SESSION SUMMARY
Course Elapsed Days: 26
Plan Fractions Treated to Date: 3
Plan Prescribed Dose Per Fraction: 2 Gy
Plan Total Fractions Prescribed: 5
Plan Total Prescribed Dose: 10 Gy
Reference Point Dosage Given to Date: 6 Gy
Reference Point Session Dosage Given: 2 Gy
Session Number: 19

## 2024-07-29 ENCOUNTER — Ambulatory Visit
Admission: RE | Admit: 2024-07-29 | Discharge: 2024-07-29 | Disposition: A | Discharge status: Home / Self Care | Source: Ambulatory Visit | Attending: Radiation Oncology | Admitting: Radiation Oncology

## 2024-07-29 ENCOUNTER — Other Ambulatory Visit: Payer: Self-pay

## 2024-07-29 DIAGNOSIS — Z170 Estrogen receptor positive status [ER+]: Secondary | ICD-10-CM | POA: Diagnosis not present

## 2024-07-29 DIAGNOSIS — C50212 Malignant neoplasm of upper-inner quadrant of left female breast: Secondary | ICD-10-CM | POA: Diagnosis not present

## 2024-07-29 DIAGNOSIS — Z510 Encounter for antineoplastic radiation therapy: Secondary | ICD-10-CM | POA: Diagnosis not present

## 2024-07-29 LAB — RAD ONC ARIA SESSION SUMMARY
Course Elapsed Days: 27
Plan Fractions Treated to Date: 4
Plan Prescribed Dose Per Fraction: 2 Gy
Plan Total Fractions Prescribed: 5
Plan Total Prescribed Dose: 10 Gy
Reference Point Dosage Given to Date: 8 Gy
Reference Point Session Dosage Given: 2 Gy
Session Number: 20

## 2024-07-30 ENCOUNTER — Ambulatory Visit
Admission: RE | Admit: 2024-07-30 | Discharge: 2024-07-30 | Disposition: A | Discharge status: Home / Self Care | Source: Ambulatory Visit | Attending: Radiation Oncology | Admitting: Radiation Oncology

## 2024-07-30 ENCOUNTER — Encounter: Payer: Self-pay | Admitting: *Deleted

## 2024-07-30 ENCOUNTER — Other Ambulatory Visit: Payer: Self-pay

## 2024-07-30 DIAGNOSIS — Z510 Encounter for antineoplastic radiation therapy: Secondary | ICD-10-CM | POA: Diagnosis not present

## 2024-07-30 LAB — RAD ONC ARIA SESSION SUMMARY
Course Elapsed Days: 28
Plan Fractions Treated to Date: 5
Plan Prescribed Dose Per Fraction: 2 Gy
Plan Total Fractions Prescribed: 5
Plan Total Prescribed Dose: 10 Gy
Reference Point Dosage Given to Date: 10 Gy
Reference Point Session Dosage Given: 2 Gy
Session Number: 21

## 2024-08-01 NOTE — Radiation Completion Notes (Signed)
 Patient Name: Stigler, Raney MRN: [id number] Date of Birth: 1951/09/25 Referring Physician: NANCYANN PERRY, M.D. Date of Service: 2024-08-01 Radiation Oncologist: Marcey Penton, M.D. 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  Diagnostic mammogram confirmed an 8 mm mass 11 o'clock position of the left breast for which ultrasound-guided biopsy was recommended.  Ultrasound of her axillary lymph nodes were normal.  Biopsy was positive for papillary carcinoma with small focus suspicious for invasion.  She went on to have a wide local excisionAnd sentinel node biopsy for a 4.5 mm grade 2 invasive solid papillary carcinoma with clear margins.  Tumor was triple negative.  She has done well postoperatively.  She specifically denies breast tenderness cough or bone pain.  Incision is well-healed.  She is now referred to radiation oncology for opinion.      ==========DELIVERED PLANS==========  First Treatment Date: 2024-07-02 Last Treatment Date: 2024-07-30   Plan Name: Breast_L_BH Site: Breast, Left Technique: 3D Mode: Photon Dose Per Fraction: 2.66 Gy Prescribed Dose (Delivered / Prescribed): 42.56 Gy / 42.56 Gy Prescribed Fxs (Delivered / Prescribed): 16 / 16   Plan Name: Breast_L_Bst Site: Breast, Left Technique: 3D Mode: Photon Dose Per Fraction: 2 Gy Prescribed Dose (Delivered / Prescribed): 10 Gy / 10 Gy Prescribed Fxs (Delivered / Prescribed): 5 / 5     ==========ON TREATMENT VISIT DATES========== 2024-07-08, 2024-07-15, 2024-07-22, 2024-07-29     ==========UPCOMING  VISITS========== 09/02/2024 [facility] RAD ONCOLOGY FOLLOW UP 30 Chrystal, Marcey, MD  08/13/2024 [facility] MED ONC EST PT Babara Call, MD        ==========APPENDIX - ON TREATMENT VISIT NOTES==========   See weekly On Treatment Notes in Epic for details in the Media tab (listed as Progress notes on the On Treatment Visit Dates listed above).

## 2024-08-12 DIAGNOSIS — R202 Paresthesia of skin: Secondary | ICD-10-CM | POA: Diagnosis not present

## 2024-08-12 DIAGNOSIS — M79642 Pain in left hand: Secondary | ICD-10-CM | POA: Diagnosis not present

## 2024-08-12 DIAGNOSIS — M542 Cervicalgia: Secondary | ICD-10-CM | POA: Diagnosis not present

## 2024-08-12 DIAGNOSIS — M79641 Pain in right hand: Secondary | ICD-10-CM | POA: Diagnosis not present

## 2024-08-13 ENCOUNTER — Encounter: Payer: Self-pay | Admitting: *Deleted

## 2024-08-13 ENCOUNTER — Inpatient Hospital Stay: Attending: Oncology | Admitting: Oncology

## 2024-08-13 ENCOUNTER — Encounter: Payer: Self-pay | Admitting: Oncology

## 2024-08-13 VITALS — BP 121/69 | HR 65 | Temp 97.3°F | Resp 16 | Ht 64.5 in | Wt 160.0 lb

## 2024-08-13 DIAGNOSIS — M8580 Other specified disorders of bone density and structure, unspecified site: Secondary | ICD-10-CM | POA: Diagnosis not present

## 2024-08-13 DIAGNOSIS — C50919 Malignant neoplasm of unspecified site of unspecified female breast: Secondary | ICD-10-CM | POA: Diagnosis not present

## 2024-08-13 DIAGNOSIS — Z79811 Long term (current) use of aromatase inhibitors: Secondary | ICD-10-CM | POA: Insufficient documentation

## 2024-08-13 DIAGNOSIS — Z90710 Acquired absence of both cervix and uterus: Secondary | ICD-10-CM | POA: Insufficient documentation

## 2024-08-13 DIAGNOSIS — Z1721 Progesterone receptor positive status: Secondary | ICD-10-CM | POA: Insufficient documentation

## 2024-08-13 DIAGNOSIS — E7800 Pure hypercholesterolemia, unspecified: Secondary | ICD-10-CM | POA: Insufficient documentation

## 2024-08-13 DIAGNOSIS — Z882 Allergy status to sulfonamides status: Secondary | ICD-10-CM | POA: Insufficient documentation

## 2024-08-13 DIAGNOSIS — E039 Hypothyroidism, unspecified: Secondary | ICD-10-CM | POA: Insufficient documentation

## 2024-08-13 DIAGNOSIS — Z881 Allergy status to other antibiotic agents status: Secondary | ICD-10-CM | POA: Insufficient documentation

## 2024-08-13 DIAGNOSIS — Z1732 Human epidermal growth factor receptor 2 negative status: Secondary | ICD-10-CM | POA: Insufficient documentation

## 2024-08-13 DIAGNOSIS — C50212 Malignant neoplasm of upper-inner quadrant of left female breast: Secondary | ICD-10-CM | POA: Insufficient documentation

## 2024-08-13 DIAGNOSIS — Z888 Allergy status to other drugs, medicaments and biological substances status: Secondary | ICD-10-CM | POA: Insufficient documentation

## 2024-08-13 DIAGNOSIS — Z885 Allergy status to narcotic agent status: Secondary | ICD-10-CM | POA: Insufficient documentation

## 2024-08-13 DIAGNOSIS — Z8619 Personal history of other infectious and parasitic diseases: Secondary | ICD-10-CM | POA: Diagnosis not present

## 2024-08-13 DIAGNOSIS — Z90722 Acquired absence of ovaries, bilateral: Secondary | ICD-10-CM | POA: Diagnosis not present

## 2024-08-13 DIAGNOSIS — Z8614 Personal history of Methicillin resistant Staphylococcus aureus infection: Secondary | ICD-10-CM | POA: Insufficient documentation

## 2024-08-13 DIAGNOSIS — Z818 Family history of other mental and behavioral disorders: Secondary | ICD-10-CM | POA: Insufficient documentation

## 2024-08-13 DIAGNOSIS — Z803 Family history of malignant neoplasm of breast: Secondary | ICD-10-CM | POA: Insufficient documentation

## 2024-08-13 DIAGNOSIS — Z8249 Family history of ischemic heart disease and other diseases of the circulatory system: Secondary | ICD-10-CM | POA: Diagnosis not present

## 2024-08-13 DIAGNOSIS — Z79899 Other long term (current) drug therapy: Secondary | ICD-10-CM | POA: Diagnosis not present

## 2024-08-13 DIAGNOSIS — Z825 Family history of asthma and other chronic lower respiratory diseases: Secondary | ICD-10-CM | POA: Diagnosis not present

## 2024-08-13 DIAGNOSIS — Z170 Estrogen receptor positive status [ER+]: Secondary | ICD-10-CM | POA: Insufficient documentation

## 2024-08-13 DIAGNOSIS — Z87891 Personal history of nicotine dependence: Secondary | ICD-10-CM | POA: Insufficient documentation

## 2024-08-13 DIAGNOSIS — M1990 Unspecified osteoarthritis, unspecified site: Secondary | ICD-10-CM | POA: Insufficient documentation

## 2024-08-13 DIAGNOSIS — Z880 Allergy status to penicillin: Secondary | ICD-10-CM | POA: Diagnosis not present

## 2024-08-13 DIAGNOSIS — Z8542 Personal history of malignant neoplasm of other parts of uterus: Secondary | ICD-10-CM | POA: Diagnosis not present

## 2024-08-13 DIAGNOSIS — Z801 Family history of malignant neoplasm of trachea, bronchus and lung: Secondary | ICD-10-CM | POA: Insufficient documentation

## 2024-08-13 MED ORDER — ANASTROZOLE 1 MG PO TABS: 1.0000 mg | ORAL_TABLET | Freq: Every day | ORAL | 3 refills | Status: AC

## 2024-08-13 NOTE — Assessment & Plan Note (Signed)
 Recommend calcium and vitamin D  supplementation. 03/2004 DEXA showed osteopenia with FRAX 10-year major osteoporotic fracture risk about 10%. Discussed about option of future bone strengthening agents. She would like to defer for now.

## 2024-08-13 NOTE — Progress Notes (Signed)
 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  Recommend adjuvant endocrine therapy for 5 years Rationale of using aromatase inhibitor -Arimidex  discussed with patient.  Side effects of Arimidex including but not limited to hot flash, muscle and joint pain, fatigue, mood swing, vaginal dryness/inching, loss of bone mineral density,hair thinning, etc were discussed with patient.  Patient voices understanding and willing to proceed.    Osteopenia Recommend calcium and vitamin D  supplementation. 03/2004 DEXA showed osteopenia with FRAX 10-year major osteoporotic fracture risk about 10%. Discussed about option of future bone strengthening agents. She would like to defer for now.    Orders Placed This Encounter  Procedures   CMP (Cancer Center only)    Standing Status:   Future    Expected Date:   11/13/2024    Expiration Date:   02/11/2025   CBC with Differential (Cancer Center Only)    Standing Status:   Future    Expected Date:   11/13/2024    Expiration Date:   02/11/2025   Follow-up 3 months    All questions were answered. The patient knows to call the clinic with any problems, questions or concerns.  Zelphia Cap, MD, PhD [facility] Hematology Oncology 08/13/2024    HISTORY OF PRESENTING ILLNESS:  Melinda Mack 73 y.o. female presents to establish care for lest breast solitary papillary carcinoma I have reviewed her chart and materials related to her cancer extensively and collaborated history with the patient. 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  No findings suspicious for malignancy in the right breast.   04/28/2024 Mammogram   Unilateral left breast diagnostic mammogram and ultrasound showed   Suspicious LEFT breast mass measuring 8 mm at the 11 o'clock position requires further characterization with ultrasound-guided biopsy.    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 Pathology showed 1. Breast, left, needle core biopsy, 11 o'clock, 12cmfn, coil clip :       - SOLID PAPILLARY CARCINOMA, SEE NOTE       - SMALL FOCUS SUSPICIOUS FOR INVASION       - LYMPHOVASCULAR INVASION: NOT IDENTIFIED       - CANCER LENGTH: 0.7 CM       - CALCIFICATIONS: NOT IDENTIFIED       - OTHER FINDINGS: NONE   ER 95% positive, PR 100% positive, Ki-67 2%, HER2 - (1+)    Menarche at age of 60 First live birth at age of 58 OCP use: no History of hysterectomy:  Uterus cancer 03/2012, total hysterectmy at [facility]  Menopausal status:  History of HRT use: no  History of chest radiation: no Number of previous breast biopsies:  left side biopsy x 2 -fibrocystic changes, PASH     05/16/2024 Cancer Staging   Staging form: Breast, AJCC 8th Edition - Clinical stage from 05/16/2024: cT1b, cN0, cM0, ER+, PR+, HER2- - Signed by Cap,  Zelphia, MD on 05/16/2024 Stage prefix: Initial diagnosis   05/26/2024 Surgery   Patient underwent left breast lumpectomy and sentinel lymph node biopsy.  Pathology showed  1. Breast, lumpectomy, left mass :       - INVASIVE SOLID PAPILLARY CARCINOMA, 4.5 MM, GRADE 2 (3+2+1) (SEE SYNOPTIC       REPORT).       - MARGINS NEGATIVE FOR CARCINOMA.       - LYMPHOVASCULAR INVASION NOT IDENTIFIED.       - BIOPSY SITE, SAVI SCOUT, AND COIL CLIP PRESENT.       -  PROGNOSTIC MARKERS ER 100%, PR 100%, HER2 (0)       2. Lymph node, sentinel, biopsy, left 1 and 2 :       - TWO LYMPH NODES, BOTH NEGATIVE FOR CARCINOMA (0/2).      Patient's status post adjuvant radiation. She tolerated well.  She presents to discuss endocrine therapy  MEDICAL HISTORY:  Past Medical History:  Diagnosis Date   Anemia    Anxiety    Breast cancer, left (HCC)    CRP elevated    Depression    Elevated alkaline phosphatase level    GERD (gastroesophageal reflux disease)    History of chicken pox    History of endometrial cancer    History of measles    History of methicillin resistant staphylococcus aureus (MRSA)    History of mumps    History of tobacco use    Hypercholesterolemia    Hypothyroidism    Obesity    Osteoarthritis    Pre-diabetes    Recurrent cystitis     SURGICAL HISTORY: Past Surgical History:  Procedure Laterality Date   ABDOMINAL HYSTERECTOMY  04/18/2012   cancer at [facility]   AXILLARY SENTINEL NODE BIOPSY Left 05/26/2024   Procedure: BIOPSY, LYMPH NODE, SENTINEL, AXILLARY;  Surgeon: Rodolph Romano, MD;  Location: [facility] ORS;  Service: General;  Laterality: Left;   BILATERAL SALPINGOOPHORECTOMY  03/2012   BREAST BIOPSY Left 05/08/2016   neg- [place]   BREAST BIOPSY Right 10/2015   neg- [place]nett   BREAST BIOPSY Left 04/30/2024   US  LT BREAST BX W LOC DEV 1ST LESION IMG BX SPEC US  GUIDE 04/30/2024 [facility]   BREAST BIOPSY Left 05/19/2024   US  LT BREAST SAVI/RF TAG 1ST LESION US  GUIDE 05/19/2024 [facility]   BREAST LUMPECTOMY WITH RADIO FREQUENCY LOCALIZER Left 05/26/2024   Procedure: BREAST LUMPECTOMY WITH RADIO FREQUENCY LOCALIZER;  Surgeon: Rodolph Romano, MD;  Location: [facility] ORS;  Service: General;  Laterality: Left;   LYMPHADENECTOMY  03/2012   MANDIBLE FRACTURE SURGERY  1984   TMJ   TONSILLECTOMY AND ADENOIDECTOMY  1970   TUBAL LIGATION  1978   US  LT BREAST BX W LOC DEV 1ST LESION IMG BX SPEC US  GUIDE Left    US  BX  LEFT BREAST COIL CLIP-PATH PENDING    SOCIAL HISTORY: Social History   Socioeconomic History   Marital status: Widowed    Spouse name: Not on file   Number of children: 3   Years of education: 56   Highest education level: 12th grade  Occupational History    Comment: Film/video editor  Tobacco Use   Smoking status: Former    Current packs/day: 0.00    Average packs/day: 0.5 packs/day for 9.0 years (4.5 ttl pk-yrs)    Types: Cigarettes    Start date: 04/27/1983    Quit date: 04/26/1994    Years since quitting: 30.3   Smokeless tobacco: Never  Tobacco comments:    QUIT IN 1996  Vaping Use   Vaping status: Never Used  Substance and Sexual Activity   Alcohol use: No    Alcohol/week: 0.0 standard drinks of alcohol   Drug use: No   Sexual activity: Not on file  Other Topics Concern   Not on file  Social History Narrative   Widowed   Caffeine use- coffee - 2 cups daily   Social Drivers of Health   Financial Resource Strain: Low Risk  (05/12/2024)   Received from [facility]   Overall Financial Resource Strain (CARDIA)    Difficulty of Paying Living Expenses: Not hard at all  Food Insecurity: No Food Insecurity (05/16/2024)   Hunger Vital Sign    Worried About Running Out of Food in the Last Year: Never true    Ran Out of Food in the Last Year: Never true  Transportation Needs: No Transportation Needs (05/16/2024)   PRAPARE - Administrator, Civil Service (Medical): No    Lack of Transportation (Non-Medical): No  Physical Activity: Insufficiently Active (04/04/2024)   Exercise Vital Sign    Days of Exercise per Week: 2 days    Minutes of Exercise per Session: 20 min  Stress: No Stress Concern Present (04/04/2024)   [employer] of Occupational Health - Occupational Stress Questionnaire    Feeling of Stress: Not at all  Social Connections: Moderately Integrated (04/04/2024)   Social Connection and Isolation Panel    Frequency of  Communication with Friends and Family: More than three times a week    Frequency of Social Gatherings with Friends and Family: More than three times a week    Attends Religious Services: More than 4 times per year    Active Member of [employer] or Organizations: Yes    Attends Banker Meetings: 1 to 4 times per year    Marital Status: Widowed  Intimate Partner Violence: Patient Declined (05/16/2024)   Humiliation, Afraid, Rape, and Kick questionnaire    Fear of Current or Ex-Partner: Patient declined    Emotionally Abused: Patient declined    Physically Abused: Patient declined    Sexually Abused: Patient declined    FAMILY HISTORY: Family History  Problem Relation Age of Onset   Lung disease Mother    COPD Mother    Hypertension Mother    Heart disease Mother    Aneurysm Mother    Lung disease Father    Emphysema Father    Healthy Sister    Healthy Sister    Hypertension Maternal Uncle    Lung cancer Maternal Uncle 71   Breast cancer Paternal Aunt 48   Aneurysm Maternal Grandmother    Heart attack Maternal Grandfather    Alzheimer's disease Paternal Grandmother    Coronary artery disease Other     ALLERGIES:  is allergic to codeine, penicillins, tetracycline, povidone-iodine, and misc. sulfonamide containing compounds.  MEDICATIONS:  Current Outpatient Medications  Medication Sig Dispense Refill   anastrozole (ARIMIDEX) 1 MG tablet Take 1 tablet (1 mg total) by mouth daily. 30 tablet 3   DULoxetine (CYMBALTA) 30 MG capsule Take 30 mg by mouth every morning.     levothyroxine  (SYNTHROID ) 50 MCG tablet Take 1 tablet by mouth once daily 90 tablet 2   lovastatin  (MEVACOR ) 40 MG tablet TAKE 1 TABLET BY MOUTH AT BEDTIME 90 tablet 3   Multiple Vitamin (MULTIVITAMIN WITH MINERALS) TABS tablet Take 1 tablet by mouth daily.  No current facility-administered medications for this visit.    Review of Systems  Constitutional:  Negative for appetite change, chills,  fatigue and fever.  HENT:   Negative for hearing loss and voice change.   Eyes:  Negative for eye problems.  Respiratory:  Negative for chest tightness and cough.   Cardiovascular:  Negative for chest pain.  Gastrointestinal:  Negative for abdominal distention, abdominal pain and blood in stool.  Endocrine: Negative for hot flashes.  Genitourinary:  Negative for difficulty urinating and frequency.   Musculoskeletal:  Negative for arthralgias.  Skin:  Negative for itching and rash.  Neurological:  Negative for extremity weakness.  Hematological:  Negative for adenopathy.  Psychiatric/Behavioral:  Negative for confusion.      PHYSICAL EXAMINATION: ECOG PERFORMANCE STATUS: 0 - Asymptomatic  Vitals:   08/13/24 1026  BP: 121/69  Pulse: 65  Resp: 16  Temp: (!) 97.3 F (36.3 C)  SpO2: 100%   Filed Weights   08/13/24 1026  Weight: 160 lb (72.6 kg)    Physical Exam Constitutional:      General: She is not in acute distress.    Appearance: She is not diaphoretic.  HENT:     Head: Normocephalic and atraumatic.  Eyes:     General: No scleral icterus. Cardiovascular:     Rate and Rhythm: Normal rate.  Pulmonary:     Effort: Pulmonary effort is normal. No respiratory distress.  Abdominal:     General: There is no distension.  Musculoskeletal:        General: Normal range of motion.     Cervical back: Normal range of motion and neck supple.  Skin:    Findings: No erythema.  Neurological:     Mental Status: She is alert and oriented to person, place, and time. Mental status is at baseline.     Motor: No abnormal muscle tone.  Psychiatric:        Mood and Affect: Affect normal.     LABORATORY DATA:  I have reviewed the data as listed    Latest Ref Rng & Units 07/21/2024   11:15 AM 07/07/2024   11:02 AM 10/22/2023    9:32 AM  CBC  WBC 4.0 - 10.5 K/uL 7.1  6.9  6.2   Hemoglobin 12.0 - 15.0 g/dL 86.5  86.5  86.3   Hematocrit 36.0 - 46.0 % 40.8  42.1  41.4   Platelets  150 - 400 K/uL 252  258  273       Latest Ref Rng & Units 05/16/2024   12:51 PM 04/04/2024    9:13 AM 10/22/2023    9:32 AM  CMP  Glucose 70 - 99 mg/dL  878  878   BUN 8 - 27 mg/dL  20  21   Creatinine 9.42 - 1.00 mg/dL  9.07  8.98   Sodium 865 - 144 mmol/L  143  142   Potassium 3.5 - 5.2 mmol/L  4.3  4.5   Chloride 96 - 106 mmol/L  105  107   CO2 20 - 29 mmol/L  20  22   Calcium 8.7 - 10.3 mg/dL  89.9  9.7   Total Protein 6.5 - 8.1 g/dL 7.9  7.4  6.8   Total Bilirubin 0.0 - 1.2 mg/dL 0.6  0.4  0.5   Alkaline Phos 38 - 126 U/L 102  152    151  137   AST 15 - 41 U/L 24  18  22    ALT  0 - 44 U/L 23  18  29       RADIOGRAPHIC STUDIES: I have personally reviewed the radiological images as listed and agreed with the findings in the report. No results found.

## 2024-08-13 NOTE — Assessment & Plan Note (Addendum)
 Invasive solid papillary carcinoma status post lumpectomy and sentinel lymph node biopsy ER 100%, PR 100%, HER2 negative S/p adjuvant radiation.  Recommend adjuvant endocrine therapy for 5 years Rationale of using aromatase inhibitor -Arimidex  discussed with patient.  Side effects of Arimidex including but not limited to hot flash, muscle and joint pain, fatigue, mood swing, vaginal dryness/inching, loss of bone mineral density,hair thinning, etc were discussed with patient.  Patient voices understanding and willing to proceed.

## 2024-09-02 ENCOUNTER — Encounter: Payer: Self-pay | Admitting: Radiation Oncology

## 2024-09-02 ENCOUNTER — Ambulatory Visit
Admission: RE | Admit: 2024-09-02 | Discharge: 2024-09-02 | Disposition: A | Discharge status: Home / Self Care | Source: Ambulatory Visit | Attending: Radiation Oncology | Admitting: Radiation Oncology

## 2024-09-02 VITALS — BP 132/82 | HR 71 | Temp 98.0°F | Resp 14 | Ht 64.5 in | Wt 161.5 lb

## 2024-09-02 DIAGNOSIS — Z170 Estrogen receptor positive status [ER+]: Secondary | ICD-10-CM | POA: Insufficient documentation

## 2024-09-02 DIAGNOSIS — Z923 Personal history of irradiation: Secondary | ICD-10-CM | POA: Diagnosis not present

## 2024-09-02 DIAGNOSIS — C50919 Malignant neoplasm of unspecified site of unspecified female breast: Secondary | ICD-10-CM

## 2024-09-02 DIAGNOSIS — C50212 Malignant neoplasm of upper-inner quadrant of left female breast: Secondary | ICD-10-CM | POA: Diagnosis not present

## 2024-09-02 DIAGNOSIS — Z79811 Long term (current) use of aromatase inhibitors: Secondary | ICD-10-CM | POA: Insufficient documentation

## 2024-09-02 DIAGNOSIS — R232 Flushing: Secondary | ICD-10-CM | POA: Insufficient documentation

## 2024-09-02 NOTE — Progress Notes (Signed)
 Radiation Oncology Follow up Note  Name: Melinda Mack   Date:   09/02/2024 MRN:  [id number] DOB: 1951/07/26    This 73 y.o. female presents to the clinic today for 1 month follow-up status post whole breast radiation to her left breast for stage Ia ER/PR positive invasive papillary carcinoma status post wide local excision and sentinel node biopsy.  REFERRING PROVIDER: Gasper Nancyann BRAVO, MD  HPI: Patient is a 73 year old female now out 1 month having completed whole breast radiation to her left breast for t ER positive stage Ia invasive papillary carcinoma.  Seen today in routine follow-up she is doing well..  She has been started on Arimidex.  Arimidex is causing hot flashes for which I recommended vitamin D  supplements.  She specifically denies breast tenderness cough or bone pain.  COMPLICATIONS OF TREATMENT: none  FOLLOW UP COMPLIANCE: keeps appointments   PHYSICAL EXAM:  BP 132/82   Pulse 71   Temp 98 F (36.7 C)   Resp 14   Ht 5' 4.5 (1.638 m)   Wt 161 lb 8 oz (73.3 kg)   BMI 27.29 kg/m  Lungs are clear to A&P cardiac examination essentially unremarkable with regular rate and rhythm. No dominant mass or nodularity is noted in either breast in 2 positions examined. Incision is well-healed. No axillary or supraclavicular adenopathy is appreciated. Cosmetic result is excellent.  Well-developed well-nourished patient in NAD. HEENT reveals PERLA, EOMI, discs not visualized.  Oral cavity is clear. No oral mucosal lesions are identified. Neck is clear without evidence of cervical or supraclavicular adenopathy. Lungs are clear to A&P. Cardiac examination is essentially unremarkable with regular rate and rhythm without murmur rub or thrill. Abdomen is benign with no organomegaly or masses noted. Motor sensory and DTR levels are equal and symmetric in the upper and lower extremities. Cranial nerves II through XII are grossly intact. Proprioception is intact. No peripheral adenopathy or  edema is identified. No motor or sensory levels are noted. Crude visual fields are within normal range.  RADIOLOGY RESULTS: No current films for review  PLAN: At the present time patient is doing well 1 month out from whole breast radiation and pleased with her overall progress.  She continues on Arimidex and I have suggested vitamin D  supplements for her hot flashes.  I have asked to see her back in 6 months for follow-up.  Patient is to call with any concerns at any time.  I would like to take this opportunity to thank you for allowing me to participate in the care of your patient.[name] Marcey Penton, MD

## 2024-10-24 ENCOUNTER — Encounter: Payer: Self-pay | Admitting: Emergency Medicine

## 2024-10-24 ENCOUNTER — Ambulatory Visit
Admission: EM | Admit: 2024-10-24 | Discharge: 2024-10-24 | Disposition: A | Discharge status: Home / Self Care | Attending: Family Medicine | Admitting: Family Medicine

## 2024-10-24 ENCOUNTER — Ambulatory Visit: Payer: Self-pay

## 2024-10-24 DIAGNOSIS — J0100 Acute maxillary sinusitis, unspecified: Secondary | ICD-10-CM

## 2024-10-24 DIAGNOSIS — J209 Acute bronchitis, unspecified: Secondary | ICD-10-CM | POA: Diagnosis not present

## 2024-10-24 DIAGNOSIS — H6993 Unspecified Eustachian tube disorder, bilateral: Secondary | ICD-10-CM | POA: Diagnosis not present

## 2024-10-24 MED ORDER — PREDNISONE 20 MG PO TABS: 40.0000 mg | ORAL_TABLET | Freq: Every day | ORAL | 0 refills | Status: AC

## 2024-10-24 MED ORDER — AZITHROMYCIN 250 MG PO TABS: 250.0000 mg | ORAL_TABLET | Freq: Every day | ORAL | 0 refills | Status: AC

## 2024-10-24 NOTE — ED Provider Notes (Signed)
 " [facility]    CSN: [id number] Arrival date & time: 10/24/24  1051      History   Chief Complaint Chief Complaint  Patient presents with   Cough    HPI Melinda Mack is a 75 y.o. female  presents for evaluation of URI symptoms for 7 days. Patient reports associated symptoms of left-sided sinus pressure/pain with ear pain, purulent nasal discharge, cough with purulent sputum. Denies N/V/D, fevers, sore throat, body aches, shortness of breath. Patient does note have a hx of asthma. Patient is not an active smoker.   Reports no known sick contacts.  Pt has taken teas and honey OTC for symptoms. Pt has no other concerns at this time.    Cough Associated symptoms: ear pain     Past Medical History:  Diagnosis Date   Anemia    Anxiety    Breast cancer, left (HCC)    CRP elevated    Depression    Elevated alkaline phosphatase level    GERD (gastroesophageal reflux disease)    History of chicken pox    History of endometrial cancer    History of measles    History of methicillin resistant staphylococcus aureus (MRSA)    History of mumps    History of tobacco use    Hypercholesterolemia    Hypothyroidism    Obesity    Osteoarthritis    Pre-diabetes    Recurrent cystitis     Patient Active Problem List   Diagnosis Date Noted   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   Elevated alkaline phosphatase measurement 04/11/2024   Primary osteoarthritis of both hands 04/04/2024   History of COVID-19 12/24/2019   Osteopenia 01/31/2017   Abnormal mammogram of right breast 11/10/2015   Fatigue 06/16/2015   History of endometrial cancer 06/15/2015   Heartburn 06/15/2015   Hot flash, menopausal 06/15/2015   Adult hypothyroidism 06/15/2015   Insomnia 06/15/2015   Seborrheic keratosis 06/15/2015   Obesity 06/15/2015   Pre-diabetes 06/15/2015   Recurrent cystitis 06/15/2015   Imbalance 06/15/2015   Abnormal C-reactive  protein 02/02/2009   Hypercholesterolemia without hypertriglyceridemia 02/02/2009   Personal history of methicillin resistant Staphylococcus aureus 06/01/2008   History of tobacco use 10/23/1994    Past Surgical History:  Procedure Laterality Date   ABDOMINAL HYSTERECTOMY  04/18/2012   cancer at [facility]   AXILLARY SENTINEL NODE BIOPSY Left 05/26/2024   Procedure: BIOPSY, LYMPH NODE, SENTINEL, AXILLARY;  Surgeon: Rodolph Romano, MD;  Location: [facility];  Service: General;  Laterality: Left;   BILATERAL SALPINGOOPHORECTOMY  03/2012   BREAST BIOPSY Left 05/08/2016   neg- [place]   BREAST BIOPSY Right 10/2015   neg- Byrnett   BREAST BIOPSY Left 04/30/2024   US  LT BREAST BX W LOC DEV 1ST LESION IMG BX SPEC US  GUIDE 04/30/2024 [facility]   BREAST BIOPSY Left 05/19/2024   US  LT BREAST SAVI/RF TAG 1ST LESION US  GUIDE 05/19/2024 [facility]   BREAST LUMPECTOMY WITH RADIO FREQUENCY LOCALIZER Left 05/26/2024   Procedure: BREAST LUMPECTOMY WITH RADIO FREQUENCY LOCALIZER;  Surgeon: Rodolph Romano, MD;  Location: [facility];  Service: General;  Laterality: Left;   LYMPHADENECTOMY  03/2012   MANDIBLE FRACTURE SURGERY  1984   TMJ   TONSILLECTOMY AND ADENOIDECTOMY  1970   TUBAL LIGATION  1978   US  LT BREAST BX W LOC DEV 1ST LESION IMG BX SPEC US  GUIDE Left    US  BX LEFT BREAST COIL CLIP-PATH PENDING  OB History     Gravida  3   Para  3   Term  3   Preterm      AB      Living  3      SAB      IAB      Ectopic      Multiple      Live Births  3        Obstetric Comments  1st Menstrual Cycle:  10  1st Pregnancy:  16           Home Medications    Prior to Admission medications  Medication Sig Start Date End Date Taking? Authorizing Provider  azithromycin  (ZITHROMAX ) 250 MG tablet Take 1 tablet (250 mg total) by mouth daily. Take first 2 tablets together, then 1 every day until finished. 10/24/24  Yes Liberta Gimpel, Jodi R, NP  predniSONE (DELTASONE) 20 MG tablet  Take 2 tablets (40 mg total) by mouth daily with breakfast for 5 days. 10/24/24 10/29/24 Yes Zakariah Dejarnette, Jodi R, NP  anastrozole  (ARIMIDEX ) 1 MG tablet Take 1 tablet (1 mg total) by mouth daily. 08/13/24   Babara Call, MD  DULoxetine (CYMBALTA) 30 MG capsule Take 30 mg by mouth every morning.    [provider]  levothyroxine  (SYNTHROID ) 50 MCG tablet Take 1 tablet by mouth once daily 07/09/24   Gasper Nancyann BRAVO, MD  lovastatin  (MEVACOR ) 40 MG tablet TAKE 1 TABLET BY MOUTH AT BEDTIME 01/29/24   Gasper Nancyann BRAVO, MD  Multiple Vitamin (MULTIVITAMIN WITH MINERALS) TABS tablet Take 1 tablet by mouth daily.    [provider]    Family History Family History  Problem Relation Age of Onset   Lung disease Mother    COPD Mother    Hypertension Mother    Heart disease Mother    Aneurysm Mother    Lung disease Father    Emphysema Father    Healthy Sister    Healthy Sister    Hypertension Maternal Uncle    Lung cancer Maternal Uncle 24   Breast cancer Paternal Aunt 48   Aneurysm Maternal Grandmother    Heart attack Maternal Grandfather    Alzheimer's disease Paternal Grandmother    Coronary artery disease Other     Social History Social History[1]   Allergies   Codeine, Penicillins, Tetracycline, Povidone-iodine, and Misc. sulfonamide containing compounds   Review of Systems Review of Systems  HENT:  Positive for congestion, ear pain, sinus pressure and sinus pain.   Respiratory:  Positive for cough.      Physical Exam Triage Vital Signs ED Triage Vitals  Encounter Vitals Group     BP 10/24/24 1145 (!) 160/86     Girls Systolic BP Percentile --      Girls Diastolic BP Percentile --      Boys Systolic BP Percentile --      Boys Diastolic BP Percentile --      Pulse Rate 10/24/24 1145 67     Resp 10/24/24 1145 14     Temp 10/24/24 1145 98.2 F (36.8 C)     Temp Source 10/24/24 1145 Oral     SpO2 10/24/24 1145 95 %     Weight 10/24/24 1143 162 lb 8 oz (73.7 kg)      Height 10/24/24 1143 5' 4.5 (1.638 m)     Head Circumference --      Peak Flow --      Pain Score 10/24/24 1143 6  Pain Loc --      Pain Education --      Exclude from Growth Chart --    No data found.  Updated Vital Signs BP (!) 160/86 (BP Location: Right Arm)   Pulse 67   Temp 98.2 F (36.8 C) (Oral)   Resp 14   Ht 5' 4.5 (1.638 m)   Wt 162 lb 8 oz (73.7 kg)   SpO2 95%   BMI 27.46 kg/m   Visual Acuity Right Eye Distance:   Left Eye Distance:   Bilateral Distance:    Right Eye Near:   Left Eye Near:    Bilateral Near:     Physical Exam Vitals and nursing note reviewed.  Constitutional:      General: She is not in acute distress.    Appearance: She is well-developed. She is not ill-appearing.  HENT:     Head: Normocephalic and atraumatic.     Right Ear: Ear canal normal. A middle ear effusion is present. Tympanic membrane is not erythematous.     Left Ear: Ear canal normal. A middle ear effusion is present. Tympanic membrane is not erythematous.     Nose: Congestion present.     Right Turbinates: Not swollen or pale.     Left Turbinates: Swollen and pale.     Right Sinus: No maxillary sinus tenderness or frontal sinus tenderness.     Left Sinus: Maxillary sinus tenderness present. No frontal sinus tenderness.     Mouth/Throat:     Mouth: Mucous membranes are moist.     Pharynx: Oropharynx is clear. Uvula midline. No oropharyngeal exudate or posterior oropharyngeal erythema.     Tonsils: No tonsillar exudate or tonsillar abscesses.  Eyes:     Conjunctiva/sclera: Conjunctivae normal.     Pupils: Pupils are equal, round, and reactive to light.  Cardiovascular:     Rate and Rhythm: Normal rate and regular rhythm.     Heart sounds: Normal heart sounds.  Pulmonary:     Effort: Pulmonary effort is normal.     Breath sounds: Normal breath sounds.  Musculoskeletal:     Cervical back: Normal range of motion and neck supple.  Lymphadenopathy:     Cervical: No  cervical adenopathy.  Skin:    General: Skin is warm and dry.  Neurological:     General: No focal deficit present.     Mental Status: She is alert and oriented to person, place, and time.  Psychiatric:        Mood and Affect: Mood normal.        Behavior: Behavior normal.      UC Treatments / Results  Labs (all labs ordered are listed, but only abnormal results are displayed) Labs Reviewed - No data to display ains abnormal data Comprehensive metabolic panel with GFR Order: [id number]  Status: Final result     Next appt: 11/18/2024 at 10:30 AM in Oncology ([facility])     Dx: Pre-diabetes; Other fatigue   Test Result Released: Yes (seen)   1 Result Note     View Follow-Up Encounter  important suggestion  Newer results are available. Click to view them now.            Component Ref Range & Units (hover) 6 mo ago (04/04/24) 1 yr ago (10/22/23) 2 yr ago (05/10/22) 2 yr ago (01/04/22) 3 yr ago (02/23/21) 4 yr ago (12/24/19) 6 yr ago (03/14/18)  Glucose 121 High  121 High  105 High  110  R 131 High  R 111 High  R 119 High  R  BUN 20 21 19  24 16 16   Creatinine, Ser 0.92 1.01 High  0.94  1.05 High  1.05 High  0.99  eGFR 66 59 Low  65  58 Low     BUN/Creatinine Ratio 22 21 20  23 15 16   Sodium 143 142 143  142 143 142  Potassium 4.3 4.5 4.7  4.6 4.5 4.6  Chloride 105 107 High  107 High   104 106 107 High   CO2 20 22 21  22 21 23   Calcium 10.0 9.7 9.5  10.0 9.6 9.4  Total Protein 7.4 6.8 6.8  6.7 7.0 6.7  Albumin 4.2 4.4 4.4 CM  4.3 4.5 4.3 R  Globulin, Total 3.2 2.4 2.4  2.4 2.5 2.4  Bilirubin Total 0.4 0.5 0.5  0.4 0.4 0.3  Alkaline Phosphatase 152 High  137 High  116  99 98 R 74 R  AST 18 22 20  21 21 19   ALT 18 29 21  27 20 21   Resulting Agency [facility] Abstrct Entry [facility]         Narrative Performed by: HOYT Performed at:  [address] [facility] [address] [place], [place]  [id number] Lab Director: Frankey Sas MD,  Phone:  [phone]  Specimen Collected: 04/04/24 09:13 Last Resulted: 04/05/24 05:35    EKG   Radiology No results found.  Procedures Procedures (including critical care time)  Medications Ordered in UC Medications - No data to display  Initial Impression / Assessment and Plan / UC Course  I have reviewed the triage vital signs and the nursing notes.  Pertinent labs & imaging results that were available during my care of the patient were reviewed by me and considered in my medical decision making (see chart for details).     Reviewed exam and symptoms with patient.  No red flags.  Patient with allergies to penicillin and tetracyclines, will do azithromycin  for sinusitis.  Will do prednisone for 5 days for her eustachian tube as she does not tolerate Flonase stating it causes her to have strep throat.  Continue nasal saline sprays as needed.  Encourage rest fluids and PCP follow-up 2 to 3 days for recheck.  ER precautions reviewed Final Clinical Impressions(s) / UC Diagnoses   Final diagnoses:  Acute maxillary sinusitis, recurrence not specified  Acute bronchitis, unspecified organism  Eustachian tube dysfunction, bilateral     Discharge Instructions      Start azithromycin  antibiotic as prescribed.  You may take prednisone daily for 5 days.  Continue nasal saline spray as needed.  Lots of rest and fluids.  Please follow-up with your PCP in 2 to 3 days for recheck.  Please go to the ER for any worsening symptoms.  I hope you feel better soon!     ED Prescriptions     Medication Sig Dispense Auth. Provider   azithromycin  (ZITHROMAX ) 250 MG tablet Take 1 tablet (250 mg total) by mouth daily. Take first 2 tablets together, then 1 every day until finished. 6 tablet Lanea Vankirk, Jodi R, NP   predniSONE (DELTASONE) 20 MG tablet Take 2 tablets (40 mg total) by mouth daily with breakfast for 5 days. 10 tablet Kye Silverstein, Jodi R, NP      PDMP not reviewed this encounter.     [1]   Social History Tobacco Use   Smoking status: Former    Current packs/day: 0.00  Average packs/day: 0.5 packs/day for 9.0 years (4.5 ttl pk-yrs)    Types: Cigarettes    Start date: 04/27/1983    Quit date: 04/26/1994    Years since quitting: 30.5   Smokeless tobacco: Never   Tobacco comments:    QUIT IN 1996  Vaping Use   Vaping status: Never Used  Substance Use Topics   Alcohol use: No    Alcohol/week: 0.0 standard drinks of alcohol   Drug use: No     Loreda Myla SAUNDERS, NP 10/24/24 1202  "

## 2024-10-24 NOTE — Telephone Encounter (Signed)
 FYI Only or Action Required?: FYI only for provider: UC advised.  Patient was last seen in primary care on 04/04/2024 by Melinda Nancyann BRAVO, MD.  Called Nurse Triage reporting Facial Pain and Nasal Congestion.  Symptoms began several days ago.  Interventions attempted: OTC medications: Tylenol and Rest, hydration, or home remedies.  Symptoms are: stable.  Triage Disposition: See HCP Within 4 Hours (Or PCP Triage)  Patient/caregiver understands and will follow disposition?: Yes                                  1. LOCATION: Where does it hurt?      Left side of face, left ear  2. ONSET: When did the sinus pain start?  (e.g., hours, days)      Saturday 3. SEVERITY: How bad is the pain?   (Scale 0-10; or none, mild, moderate or severe)     Describes pain as swelling feeling 5. NASAL CONGESTION: Is the nose blocked? If Yes, ask: Can you open it or must you breathe through your mouth?     Denies difficulty breathing, states she is able to breathe through her mouth when needed  6. NASAL DISCHARGE: Do you have discharge from your nose? If so ask, What color?     Yes, right side- clear yellow, left side- blood tinged mucous 7. FEVER: Do you have a fever? If Yes, ask: What is it, how was it measured, and when did it start?      Ran fever Sunday-Tuesday, fever broke on Tuesday night 8. OTHER SYMPTOMS: Do you have any other symptoms? (e.g., sore throat, cough, earache, difficulty breathing)     Mild headache, productive cough (green mucous, denies blood) States she feels swelling under left eye to left ear Denies chest pain, denies redness, denies wheezing    This RN advised evaluation today. No availability at PCP office or surrounding offices within region. This RN advised UC. Patient agreed to go.  Copied from CRM #[id number]. Topic: Clinical - Red Word Triage >> Oct 24, 2024  9:16 AM Wess RAMAN wrote: Red Word that prompted transfer to  Nurse Triage:  Sick for over a week with head congestion, aches and pain, cough, left side of ear feels like it will burst, burning in throat  Reason for Disposition  [1] Redness or swelling on the cheek, forehead or around the eye AND [2] no fever  Protocols used: Sinus Pain or Congestion-A-AH

## 2024-10-24 NOTE — Discharge Instructions (Addendum)
 Start azithromycin  antibiotic as prescribed.  You may take prednisone daily for 5 days.  Continue nasal saline spray as needed.  Lots of rest and fluids.  Please follow-up with your PCP in 2 to 3 days for recheck.  Please go to the ER for any worsening symptoms.  I hope you feel better soon!

## 2024-10-24 NOTE — ED Triage Notes (Signed)
 Patient c/o cough and chest congestion for a week.  Patient reports pressure and fullness on the left side of her head and face that started yesterday.  Patient unsure of fevers.

## 2024-11-18 ENCOUNTER — Inpatient Hospital Stay: Admitting: Oncology

## 2024-11-18 ENCOUNTER — Inpatient Hospital Stay

## 2024-12-26 ENCOUNTER — Inpatient Hospital Stay

## 2024-12-26 ENCOUNTER — Inpatient Hospital Stay: Admitting: Oncology

## 2025-02-25 ENCOUNTER — Ambulatory Visit

## 2025-03-05 ENCOUNTER — Ambulatory Visit: Admitting: Radiation Oncology
